# Patient Record
Sex: Female | Born: 1940 | Race: White | Hispanic: No | Smoking: Never smoker
Health system: Southern US, Community
[De-identification: ages and names within clinical notes are randomized; demographics above are authoritative.]

## PROBLEM LIST (undated history)

## (undated) DIAGNOSIS — C44319 Basal cell carcinoma of skin of other parts of face: Secondary | ICD-10-CM

## (undated) DIAGNOSIS — L679 Hair color and hair shaft abnormality, unspecified: Secondary | ICD-10-CM

## (undated) DIAGNOSIS — IMO0001 Reserved for inherently not codable concepts without codable children: Secondary | ICD-10-CM

## (undated) DIAGNOSIS — M791 Myalgia, unspecified site: Secondary | ICD-10-CM

## (undated) DIAGNOSIS — Z9889 Other specified postprocedural states: Secondary | ICD-10-CM

## (undated) DIAGNOSIS — T4145XA Adverse effect of unspecified anesthetic, initial encounter: Secondary | ICD-10-CM

## (undated) DIAGNOSIS — L608 Other nail disorders: Secondary | ICD-10-CM

## (undated) DIAGNOSIS — L570 Actinic keratosis: Secondary | ICD-10-CM

## (undated) DIAGNOSIS — T8859XA Other complications of anesthesia, initial encounter: Secondary | ICD-10-CM

## (undated) DIAGNOSIS — Z86018 Personal history of other benign neoplasm: Secondary | ICD-10-CM

## (undated) DIAGNOSIS — M1990 Unspecified osteoarthritis, unspecified site: Secondary | ICD-10-CM

## (undated) DIAGNOSIS — I639 Cerebral infarction, unspecified: Secondary | ICD-10-CM

## (undated) DIAGNOSIS — I10 Essential (primary) hypertension: Secondary | ICD-10-CM

## (undated) DIAGNOSIS — M5030 Other cervical disc degeneration, unspecified cervical region: Secondary | ICD-10-CM

## (undated) DIAGNOSIS — G459 Transient cerebral ischemic attack, unspecified: Secondary | ICD-10-CM

## (undated) DIAGNOSIS — R112 Nausea with vomiting, unspecified: Secondary | ICD-10-CM

## (undated) HISTORY — DX: Other nail disorders: L60.8

## (undated) HISTORY — DX: Basal cell carcinoma of skin of other parts of face: C44.319

## (undated) HISTORY — DX: Actinic keratosis: L57.0

## (undated) HISTORY — PX: TONSILLECTOMY: SUR1361

## (undated) HISTORY — PX: FOOT SURGERY: SHX648

## (undated) HISTORY — DX: Myalgia, unspecified site: M79.10

## (undated) HISTORY — DX: Personal history of other benign neoplasm: Z86.018

## (undated) HISTORY — PX: FRACTURE SURGERY: SHX138

## (undated) HISTORY — PX: COLONOSCOPY: SHX174

## (undated) HISTORY — PX: ABDOMINAL HYSTERECTOMY: SHX81

## (undated) HISTORY — PX: OTHER SURGICAL HISTORY: SHX169

## (undated) HISTORY — DX: Cerebral infarction, unspecified: I63.9

## (undated) HISTORY — DX: Hair color and hair shaft abnormality, unspecified: L67.9

## (undated) HISTORY — DX: Essential (primary) hypertension: I10

---

## 1998-07-08 ENCOUNTER — Ambulatory Visit (HOSPITAL_COMMUNITY): Admission: RE | Admit: 1998-07-08 | Discharge: 1998-07-08 | Payer: Self-pay | Admitting: Gastroenterology

## 2001-05-17 ENCOUNTER — Other Ambulatory Visit: Admission: RE | Admit: 2001-05-17 | Discharge: 2001-05-17 | Payer: Self-pay | Admitting: Obstetrics and Gynecology

## 2001-10-02 ENCOUNTER — Encounter: Admission: RE | Admit: 2001-10-02 | Discharge: 2001-12-31 | Payer: Self-pay | Admitting: Obstetrics and Gynecology

## 2002-01-07 ENCOUNTER — Encounter: Payer: Self-pay | Admitting: Emergency Medicine

## 2002-01-07 ENCOUNTER — Emergency Department (HOSPITAL_COMMUNITY): Admission: EM | Admit: 2002-01-07 | Discharge: 2002-01-07 | Payer: Self-pay | Admitting: Emergency Medicine

## 2002-05-21 ENCOUNTER — Other Ambulatory Visit: Admission: RE | Admit: 2002-05-21 | Discharge: 2002-05-21 | Payer: Self-pay | Admitting: Obstetrics and Gynecology

## 2002-09-11 ENCOUNTER — Ambulatory Visit (HOSPITAL_COMMUNITY): Admission: RE | Admit: 2002-09-11 | Discharge: 2002-09-11 | Payer: Self-pay | Admitting: Gastroenterology

## 2003-10-23 ENCOUNTER — Inpatient Hospital Stay (HOSPITAL_COMMUNITY): Admission: RE | Admit: 2003-10-23 | Discharge: 2003-10-26 | Payer: Self-pay | Admitting: Neurosurgery

## 2004-05-15 DIAGNOSIS — I639 Cerebral infarction, unspecified: Secondary | ICD-10-CM

## 2004-05-15 HISTORY — DX: Cerebral infarction, unspecified: I63.9

## 2005-12-15 ENCOUNTER — Inpatient Hospital Stay: Payer: Self-pay | Admitting: Internal Medicine

## 2005-12-15 ENCOUNTER — Other Ambulatory Visit: Payer: Self-pay

## 2005-12-27 ENCOUNTER — Ambulatory Visit: Payer: Self-pay | Admitting: Internal Medicine

## 2006-09-11 ENCOUNTER — Ambulatory Visit: Payer: Self-pay | Admitting: Internal Medicine

## 2006-09-20 ENCOUNTER — Ambulatory Visit: Payer: Self-pay | Admitting: Internal Medicine

## 2006-10-04 ENCOUNTER — Ambulatory Visit: Payer: Self-pay | Admitting: Gastroenterology

## 2006-10-04 ENCOUNTER — Emergency Department: Payer: Self-pay | Admitting: Emergency Medicine

## 2008-06-18 ENCOUNTER — Encounter: Admission: RE | Admit: 2008-06-18 | Discharge: 2008-06-18 | Payer: Self-pay | Admitting: Neurosurgery

## 2009-02-09 ENCOUNTER — Ambulatory Visit: Payer: Self-pay | Admitting: Cardiology

## 2009-12-01 ENCOUNTER — Ambulatory Visit: Payer: Self-pay | Admitting: Family Medicine

## 2010-06-04 ENCOUNTER — Encounter: Payer: Self-pay | Admitting: Neurosurgery

## 2010-08-12 ENCOUNTER — Emergency Department: Payer: Self-pay | Admitting: Emergency Medicine
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
## 2010-09-30 NOTE — Op Note (Signed)
NAMECHERRILL, Kristina Banks                          ACCOUNT NO.:  [id number]   MEDICAL RECORD NO.:  [id number]                   PATIENT TYPE:  INP   LOCATION:  2899                                 FACILITY:  [facility]   PHYSICIAN:  Hilda Lias, M.D.                DATE OF BIRTH:  15-May-1941   DATE OF PROCEDURE:  10/23/2003  DATE OF DISCHARGE:                                 OPERATIVE REPORT   PREOPERATIVE DIAGNOSIS:  C4-C5 and C5-C6 spondylosis with chronic  radiculopathy.   POSTOPERATIVE DIAGNOSIS:  C4-C5 and C5-C6 spondylosis with chronic  radiculopathy.   PROCEDURE:  C4-C5 and C5-C6 spinal cord decompression, bilateral  foraminotomy, interbody fusion using bone graft from the right hip, plate  from C4 to C6, microscope.   SURGEON:  Hilda Lias, M.D.   ASSISTANT:  Payton Doughty, M.D.   CLINICAL HISTORY:  The patient was admitted because of neck pain with  radiation to both upper extremities.  X-ray showed spondylosis at the level  of C4-C5 and C5-C6.  The patient was seen before by a surgeon who advised  surgery.  The risks were explained during the history and physical.  The  patient wanted to use her own bone from the hip.  The risks associated with  the hip were explained including possible hematoma, infection, and numbness.   PROCEDURE:  The patient was taken to the OR and after intubation, the neck  was prepped with Betadine as well as the right hip.  A transverse incision  in the skin through the subcutaneous tissue and platysma was carried out.  An x-ray showed that we were at the level of 5-6.  The patient had anterior  osteophyte which were removed.  We brought the microscope into the area and  with the microcuret, we did total gross discectomy.  The patient had quite a  bit of degenerative disc disease.  The posterior ligament was also thick.  An incision was made and decompression of the spinal cord as well as the C5  nerve root was done.  The same procedure was done  essentially at the level  of 5-6.  Then, we drilled the endplates.  From then on, we went to the hip  where an incision was made through the skin and subcutaneous tissue until we  found the iliac crest.  Using the osteotome, we removed two pieces of bone.  The bone was quite osteoporotic and the donor site was bleeding quite a bit.  The bone was cleaned and they were introduced at the level of 4-5 and 5-6.  This was followed by a plate using five screws.  Then, we went back to the  hip.  Hemostasis was done.  Then, we had to use Avitene to allow for  hemostasis.  Once we were happy with the hemostasis, the wounds were closed  with Vicryl and Steri-Strips.  Hilda Lias, M.D.    EB/MEDQ  D:  10/23/2003  T:  10/23/2003  Job:  [id number]

## 2011-12-04 ENCOUNTER — Ambulatory Visit: Payer: Self-pay | Admitting: Gastroenterology

## 2012-07-31 ENCOUNTER — Emergency Department: Payer: Self-pay | Admitting: Emergency Medicine

## 2013-02-13 ENCOUNTER — Other Ambulatory Visit: Payer: Self-pay | Admitting: Podiatry

## 2013-02-13 MED ORDER — PROMETHAZINE HCL 25 MG PO TABS: 25.0000 mg | ORAL_TABLET | Freq: Four times a day (QID) | ORAL | Status: DC | PRN

## 2013-02-13 MED ORDER — OXYCODONE-ACETAMINOPHEN 10-650 MG PO TABS: 1.0000 | ORAL_TABLET | Freq: Four times a day (QID) | ORAL | Status: DC | PRN

## 2013-02-13 MED ORDER — CEPHALEXIN 500 MG PO CAPS: 500.0000 mg | ORAL_CAPSULE | Freq: Three times a day (TID) | ORAL | Status: DC

## 2013-02-14 ENCOUNTER — Encounter: Payer: Self-pay | Admitting: Podiatry

## 2013-02-14 ENCOUNTER — Other Ambulatory Visit: Payer: Self-pay | Admitting: *Deleted

## 2013-02-14 ENCOUNTER — Telehealth: Payer: Self-pay | Admitting: *Deleted

## 2013-02-14 DIAGNOSIS — L851 Acquired keratosis [keratoderma] palmaris et plantaris: Secondary | ICD-10-CM

## 2013-02-14 DIAGNOSIS — M898X9 Other specified disorders of bone, unspecified site: Secondary | ICD-10-CM

## 2013-02-14 DIAGNOSIS — L600 Ingrowing nail: Secondary | ICD-10-CM

## 2013-02-14 MED ORDER — OXYCODONE-ACETAMINOPHEN 10-325 MG PO TABS: 1.0000 | ORAL_TABLET | Freq: Four times a day (QID) | ORAL | Status: DC | PRN

## 2013-02-14 MED ORDER — MEPERIDINE HCL 50 MG PO TABS: 50.0000 mg | ORAL_TABLET | ORAL | Status: DC | PRN

## 2013-02-14 NOTE — Telephone Encounter (Signed)
Pt's husband Maisie Fus states pt's Percocet was written wrong.  Dr Irving Shows rewrote Percocet to 10/325mg  with the same sig and quantity.  Pt's husband knows to pick-up corrected prescription at the [facility].

## 2013-02-18 ENCOUNTER — Ambulatory Visit (INDEPENDENT_AMBULATORY_CARE_PROVIDER_SITE_OTHER): Payer: Medicare Other | Admitting: Podiatry

## 2013-02-18 ENCOUNTER — Encounter: Payer: Self-pay | Admitting: Podiatry

## 2013-02-18 ENCOUNTER — Ambulatory Visit (INDEPENDENT_AMBULATORY_CARE_PROVIDER_SITE_OTHER): Payer: Medicare Other

## 2013-02-18 VITALS — BP 129/72 | HR 67 | Temp 99.5°F | Resp 12 | Ht 65.0 in | Wt 142.0 lb

## 2013-02-18 DIAGNOSIS — G8918 Other acute postprocedural pain: Secondary | ICD-10-CM

## 2013-02-18 DIAGNOSIS — S9030XA Contusion of unspecified foot, initial encounter: Secondary | ICD-10-CM | POA: Insufficient documentation

## 2013-02-18 DIAGNOSIS — S9031XD Contusion of right foot, subsequent encounter: Secondary | ICD-10-CM

## 2013-02-18 DIAGNOSIS — Z5189 Encounter for other specified aftercare: Secondary | ICD-10-CM

## 2013-02-18 DIAGNOSIS — M898X7 Other specified disorders of bone, ankle and foot: Secondary | ICD-10-CM

## 2013-02-18 DIAGNOSIS — M898X9 Other specified disorders of bone, unspecified site: Secondary | ICD-10-CM

## 2013-02-18 NOTE — Progress Notes (Signed)
Kristina Banks presents today status post exostectomy hallux right, nail excision hallux right, excision soft tissue tumor hallux right, last Friday. She states that the toe feels pretty good but the outside of her foot is killing me she refers to the fifth metatarsal of the right foot. Denies any trauma states that she's been in her postop shoe the entire time.  Objective: Pulses are strongly palpable to the right foot dry sterile dressing was intact. Once removed demonstrates erythema and edema to the fifth metatarsal base of the right foot. There is tenderness on palpation of this area. Radiographic evaluation does not demonstrate any type of osseous abnormalities to the fifth metatarsal base of the right foot.  Assessment: Well-healing surgical hallux right. Contusion and pain to the fifth metatarsal base right probably associated with the dressing.  Plan: Redressed with an dry sterile compressive dressing today according the fifth metatarsal base. She will continue to followup with me Thursday of this week. Sutures will be removed one week from this Thursday.

## 2013-02-20 ENCOUNTER — Encounter: Payer: Self-pay | Admitting: Podiatry

## 2013-02-26 ENCOUNTER — Ambulatory Visit (INDEPENDENT_AMBULATORY_CARE_PROVIDER_SITE_OTHER): Payer: Medicare Other | Admitting: Podiatry

## 2013-02-26 ENCOUNTER — Encounter: Payer: Self-pay | Admitting: Podiatry

## 2013-02-26 VITALS — BP 138/72 | HR 64 | Temp 98.4°F | Resp 16 | Ht 65.0 in | Wt 142.0 lb

## 2013-02-26 DIAGNOSIS — M898X9 Other specified disorders of bone, unspecified site: Secondary | ICD-10-CM | POA: Insufficient documentation

## 2013-02-26 NOTE — Progress Notes (Signed)
Kristina Banks presents today 12 days status post excision soft tissue mass hallux right and excision of nail border hallux right. She states she is ready to wash the toe.  Objective: Dry sterile dressing was intact. Once removed reveals sutures are in place margins to the matrixectomy appear to be healed however the more medial incision does not appear to be healed yet. There is no signs of infection.  Assessment: Well-healing surgical toe hallux right  Plan: Suture removal of the matrixectomy site. We will remove the remainder of the sutures in one week. She is to start soaking this toe in Epsom salts in warm water. I will followup with her for suture removal next week.

## 2013-02-27 ENCOUNTER — Encounter: Payer: Medicare Other | Admitting: Podiatry

## 2013-03-04 ENCOUNTER — Encounter: Payer: Medicare Other | Admitting: Podiatry

## 2013-03-05 ENCOUNTER — Ambulatory Visit (INDEPENDENT_AMBULATORY_CARE_PROVIDER_SITE_OTHER): Payer: Medicare Other | Admitting: Podiatry

## 2013-03-05 ENCOUNTER — Ambulatory Visit (INDEPENDENT_AMBULATORY_CARE_PROVIDER_SITE_OTHER): Payer: Medicare Other

## 2013-03-05 ENCOUNTER — Encounter: Payer: Self-pay | Admitting: Podiatry

## 2013-03-05 VITALS — BP 130/62 | HR 70 | Resp 16 | Ht 65.0 in | Wt 142.0 lb

## 2013-03-05 DIAGNOSIS — M898X9 Other specified disorders of bone, unspecified site: Secondary | ICD-10-CM

## 2013-03-05 DIAGNOSIS — M779 Enthesopathy, unspecified: Secondary | ICD-10-CM

## 2013-03-05 NOTE — Progress Notes (Signed)
Kristina Banks presents today for postop visit regarding her right foot date of surgery was 10 3. Sutures are removed margins appear to be well coapted a little bit of pain she says.  Objective: Vital signs are stable she is alert and oriented x3 pulses remain palpable right lower extremity. Surgical site right hallux demonstrates no erythema edema saline is drainage or odor margins are well coapted no dehiscence. Radiographs do demonstrate complete resection of exostosis hallux right.  Assessment: Well-healing surgical foot right.  Plan: We discussed etiology pathology conservative versus surgical therapies. She will continue to wrap the toe with Caban. Followup with me in one month for final set of x-rays.

## 2013-03-06 ENCOUNTER — Encounter: Payer: Self-pay | Admitting: Podiatry

## 2013-03-13 DIAGNOSIS — Z86018 Personal history of other benign neoplasm: Secondary | ICD-10-CM

## 2013-03-13 HISTORY — DX: Personal history of other benign neoplasm: Z86.018

## 2013-04-07 ENCOUNTER — Encounter: Payer: Self-pay | Admitting: Podiatry

## 2013-04-07 ENCOUNTER — Ambulatory Visit (INDEPENDENT_AMBULATORY_CARE_PROVIDER_SITE_OTHER): Payer: Medicare Other | Admitting: Podiatry

## 2013-04-07 ENCOUNTER — Ambulatory Visit (INDEPENDENT_AMBULATORY_CARE_PROVIDER_SITE_OTHER): Payer: Medicare Other

## 2013-04-07 VITALS — BP 134/76 | HR 71 | Resp 16 | Ht 65.0 in | Wt 142.0 lb

## 2013-04-07 DIAGNOSIS — Z9889 Other specified postprocedural states: Secondary | ICD-10-CM

## 2013-04-07 NOTE — Progress Notes (Signed)
Lular presents today for followup of an exostectomy tibial border hallux right at the IPJ. She states it really hasn't gotten any better yet. She still has some surgical soreness.  Objective: Patient is daughter and recently died at the age of 49. She still quite distraught. The hallux right demonstrate some erythema and the scar but nothing abnormal. Radiographic evaluation demonstrates very nice medial condyle resection.  Assessment: Well-healing surgical hallux right  Plan: I encouraged her to massage the scar site and followup with me on an as-needed basis.

## 2013-04-20 ENCOUNTER — Emergency Department: Payer: Self-pay | Admitting: Internal Medicine

## 2013-05-26 NOTE — Progress Notes (Signed)
1. SURGICAL EXCISION TIBIAL BORDER HALLUX NAIL RT  2. EXOSTECTOMY MEDIAL DISTAL PHALANGE RT  3. EXCISION SOFT TISSUE TUMOR RT HALLUX

## 2013-06-17 ENCOUNTER — Other Ambulatory Visit: Payer: Self-pay | Admitting: Gastroenterology

## 2013-06-17 LAB — CLOSTRIDIUM DIFFICILE(ARMC)

## 2013-11-24 ENCOUNTER — Ambulatory Visit (INDEPENDENT_AMBULATORY_CARE_PROVIDER_SITE_OTHER): Payer: Medicare Other | Admitting: Podiatry

## 2013-11-24 ENCOUNTER — Encounter: Payer: Self-pay | Admitting: Podiatry

## 2013-11-24 ENCOUNTER — Ambulatory Visit (INDEPENDENT_AMBULATORY_CARE_PROVIDER_SITE_OTHER): Payer: Medicare Other

## 2013-11-24 DIAGNOSIS — M898X9 Other specified disorders of bone, unspecified site: Secondary | ICD-10-CM

## 2013-11-24 DIAGNOSIS — L600 Ingrowing nail: Secondary | ICD-10-CM

## 2013-11-24 DIAGNOSIS — M7750 Other enthesopathy of unspecified foot: Secondary | ICD-10-CM

## 2013-11-24 DIAGNOSIS — M778 Other enthesopathies, not elsewhere classified: Secondary | ICD-10-CM

## 2013-11-24 DIAGNOSIS — M779 Enthesopathy, unspecified: Secondary | ICD-10-CM

## 2013-11-24 MED ORDER — NEOMYCIN-POLYMYXIN-HC 3.5-10000-1 OT SOLN: OTIC | Status: DC

## 2013-11-24 NOTE — Progress Notes (Signed)
She presents today chief complaint of pain to the IP joint of the hallux right with swelling and tenderness. The surgical site or we performed a exostectomy a year ago. She states that she also has pain along the tibial border of the hallux left.  Objective: Vital signs are stable she is alert and oriented x3. Pain on palpation medial aspect of the IP joint hallux right. Radiographic evaluation demonstrates well-healing condylectomy/exostectomy medial distal phalanx right foot. She has erythema and edema with a sharp incurvated nail margin along the tibial border of the hallux left.  Assessment: Scar tissue and capsulitis IP joint of the hallux right. Ingrown nail paronychia abscess tibial border hallux left.  Plan: Injected dexamethasone along the medial aspect of the hallux right. Performed a chemical matrixectomy to the tibial border of the hallux left. She tolerated this procedure well with local anesthetic she was given both oral and written home-going instructions for the care of this toe as well as for a prescription for Cortisporin Otic and I will followup with her in one week. We also discussed appropriate shoe gear to prevent irritation of the hallux right.

## 2013-11-24 NOTE — Patient Instructions (Signed)

## 2013-12-01 ENCOUNTER — Encounter: Payer: Self-pay | Admitting: Podiatry

## 2013-12-01 ENCOUNTER — Ambulatory Visit (INDEPENDENT_AMBULATORY_CARE_PROVIDER_SITE_OTHER): Payer: Medicare Other | Admitting: Podiatry

## 2013-12-01 DIAGNOSIS — L600 Ingrowing nail: Secondary | ICD-10-CM

## 2013-12-01 NOTE — Progress Notes (Signed)
She presents today for followup of her matrixectomy tibial border hallux left. She states it is doing great. She denies fever chills nausea vomiting muscle aches and pains he continues to soak regularly.  Objective: Pulses are palpable left foot. Margin appears to be well healing along the tibial border of the hallux left. There is no erythema edema cellulitis drainage or odor.  Assessment: Well-healing surgical toe hallux left.  Plan: Followup with me on an as-needed basis. Continue to soak in Epsom salts and water. Apply Cortisporin otic twice daily until completely healed.

## 2014-06-18 ENCOUNTER — Ambulatory Visit: Payer: Self-pay | Admitting: Specialist

## 2014-07-02 ENCOUNTER — Ambulatory Visit: Payer: Self-pay | Admitting: Specialist

## 2014-07-02 DIAGNOSIS — I10 Essential (primary) hypertension: Secondary | ICD-10-CM

## 2014-07-08 ENCOUNTER — Ambulatory Visit: Payer: Self-pay | Admitting: Specialist

## 2014-09-13 NOTE — Op Note (Signed)
PATIENT NAME:  Kristina Banks, Kristina Banks MR#:  [id number] DATE OF BIRTH:  June 17, 1940  DATE OF PROCEDURE:  07/08/2014  PREOPERATIVE DIAGNOSES:  1.  Anterior partial articular supraspinatus tendon avulsion lesion, left supraspinatus tendon  2.  Advanced osteoarthritis, left acromioclavicular joint.  3.  Severe fraying of the biceps tendon.  4.  Severe bursitis and impingement of subacromial space.   POSTOPERATIVE DIAGNOSES: 1.  Anterior partial articular supraspinatus tendon avulsion lesion, left supraspinatus tendon  2.  Advanced osteoarthritis, left acromioclavicular joint.  3.  Severe fraying of the biceps tendon.  4.  Severe bursitis and impingement of subacromial space.  OPERATIVE PROCEDURES:  1.  Arthroscopic left rotator cuff repair.  2.  Arthroscopic left distal clavicle excision.  3.  Arthroscopic subacromial decompression and bursectomy. 4.  Arthroscopic biceps tenotomy.   SURGEON: Park Breed, MD   ANESTHESIA: General endotracheal plus interscalene block.   COMPLICATIONS: None.   ESTIMATED BLOOD LOSS: Minimal.   REPLACEMENT: None.   DESCRIPTION OF PROCEDURE: The patient was brought to the operating room where she underwent satisfactory general endotracheal anesthesia after a left interscalene block was put in place. She was turned into a right lateral decubitus position and padded appropriately on the beanbag. The left shoulder was prepped and draped in sterile fashion and placed in 10 pounds of traction. Arthroscopy was carried out from a posterior portal with accessory portals being made laterally and anteriorly during the procedure. The above findings were encountered upon entering the joint. There was fraying of the labrum and the biceps was severely frayed. The ArthroCare wand was used to release this at its origin. The motorized resector was used to remove frayed labral tissue. The glenohumeral surfaces were normal. Superiorly and anteriorly, the biceps tendon was seen to be  detached anteriorly with a few fibers remaining superiorly. The spinal needle was introduced and a PDS suture passed through this area to mark it for later. The arthroscope was then redirected into the subacromial space where there was extensive bursitis. This was removed with a motorized resector and ArthroCare wand. The soft tissue was removed from the undersurface of the acromion and distal clavicle. Bur was used to remove the prominence of the acromion and a portion of the distal clavicle. The bur was introduced from the anterior portal and the remainder of the distal clavicle was removed. The diseased area of the cuff was identified by the PDS suture, which was removed. Using probe showed that the remaining fibers were quite thin and the probe enter the joint easily. A motorized resector and ArthroCare wand were used to debride the remaining diseased tissue. The tear was quite anterior right behind the subscapularis tendon. The bony surface of the tuberosity was debrided and a bur used to freshen the bone. The majority of the tear had retracted more posteriorly and by pulling it anteriorly we could cover the tuberosity well. Three individual Magnum wire sutures were placed at the apex of the tear. Another suture was then placed in the main body of the tendon and a 6.5 mm SpeedScrew introduced into the lateral aspect of the tuberosity. These sutures were then placed in the SpeedScrew device and tightened down snugly. Five pounds of traction were removed and the cuff came over and covered the tuberosity nicely. After final tightening, the apparatus was removed and the sutures cut. Final visualization showed excellent decompression and good coverage of the tuberosity with the repair. After thorough irrigation, the stab wounds were closed with 3-0 nylon suture; 0.25  Marcaine with epinephrine and morphine was placed in the bursa. Dry sterile dressings and TENS pads were applied. A padded sling was applied. The  patient was awakened and taken to recovery in good condition.    ____________________________ Park Breed, MD hem:bm D: 07/08/2014 11:32:02 ET T: 07/09/2014 02:27:57 ET JOB#: [id number]  cc: Park Breed, MD, <Dictator> Park Breed MD ELECTRONICALLY SIGNED 07/13/2014 15:30

## 2014-10-15 ENCOUNTER — Other Ambulatory Visit: Payer: Self-pay | Admitting: Specialist

## 2014-10-15 DIAGNOSIS — M75122 Complete rotator cuff tear or rupture of left shoulder, not specified as traumatic: Secondary | ICD-10-CM

## 2014-10-21 ENCOUNTER — Ambulatory Visit
Admission: RE | Admit: 2014-10-21 | Discharge: 2014-10-21 | Disposition: A | Discharge status: Home / Self Care | Payer: Medicare Other | Source: Ambulatory Visit | Attending: Specialist | Admitting: Specialist

## 2014-10-21 DIAGNOSIS — M75122 Complete rotator cuff tear or rupture of left shoulder, not specified as traumatic: Secondary | ICD-10-CM | POA: Diagnosis present

## 2014-10-21 DIAGNOSIS — S46812A Strain of other muscles, fascia and tendons at shoulder and upper arm level, left arm, initial encounter: Secondary | ICD-10-CM | POA: Diagnosis not present

## 2014-10-21 DIAGNOSIS — Z9889 Other specified postprocedural states: Secondary | ICD-10-CM | POA: Insufficient documentation

## 2014-10-26 ENCOUNTER — Other Ambulatory Visit: Payer: Self-pay | Admitting: Family Medicine

## 2014-10-26 DIAGNOSIS — G44321 Chronic post-traumatic headache, intractable: Secondary | ICD-10-CM

## 2014-11-02 ENCOUNTER — Ambulatory Visit
Admission: RE | Admit: 2014-11-02 | Discharge: 2014-11-02 | Disposition: A | Discharge status: Home / Self Care | Payer: Medicare Other | Source: Ambulatory Visit | Attending: Family Medicine | Admitting: Family Medicine

## 2014-11-02 DIAGNOSIS — G44329 Chronic post-traumatic headache, not intractable: Secondary | ICD-10-CM | POA: Diagnosis present

## 2014-11-02 DIAGNOSIS — W19XXXA Unspecified fall, initial encounter: Secondary | ICD-10-CM | POA: Insufficient documentation

## 2014-11-02 DIAGNOSIS — G44321 Chronic post-traumatic headache, intractable: Secondary | ICD-10-CM

## 2014-12-08 ENCOUNTER — Encounter
Admission: RE | Admit: 2014-12-08 | Discharge: 2014-12-08 | Disposition: A | Discharge status: Home / Self Care | Payer: Medicare Other | Source: Ambulatory Visit | Attending: Specialist | Admitting: Specialist

## 2014-12-08 DIAGNOSIS — Z01812 Encounter for preprocedural laboratory examination: Secondary | ICD-10-CM | POA: Insufficient documentation

## 2014-12-08 DIAGNOSIS — Z01810 Encounter for preprocedural cardiovascular examination: Secondary | ICD-10-CM | POA: Insufficient documentation

## 2014-12-08 HISTORY — DX: Unspecified osteoarthritis, unspecified site: M19.90

## 2014-12-08 HISTORY — DX: Reserved for inherently not codable concepts without codable children: IMO0001

## 2014-12-08 LAB — POTASSIUM: POTASSIUM: 4 mmol/L (ref 3.5–5.1)

## 2014-12-08 NOTE — Patient Instructions (Signed)
  Your procedure is scheduled on: 12/16/14 Wed Report to Day Surgery. To find out your arrival time please call [phone] between 1PM - 3PM on 12/15/14 Tues.  Remember: Instructions that are not followed completely may result in serious medical risk, up to and including death, or upon the discretion of your surgeon and anesthesiologist your surgery may need to be rescheduled.    _x___ 1. Do not eat food or drink liquids after midnight. No gum chewing or hard candies.     ____ 2. No Alcohol for 24 hours before or after surgery.   ____ 3. Bring all medications with you on the day of surgery if instructed.    _x___ 4. Notify your doctor if there is any change in your medical condition     (cold, fever, infections).     Do not wear jewelry, make-up, hairpins, clips or nail polish.  Do not wear lotions, powders, or perfumes. You may wear deodorant.  Do not shave 48 hours prior to surgery. Men may shave face and neck.  Do not bring valuables to the hospital.    [facility] is not responsible for any belongings or valuables.               Contacts, dentures or bridgework may not be worn into surgery.  Leave your suitcase in the car. After surgery it may be brought to your room.  For patients admitted to the hospital, discharge time is determined by your                treatment team.   Patients discharged the day of surgery will not be allowed to drive home.   Please read over the following fact sheets that you were given:      ____ Take these medicines the morning of surgery with A SIP OF WATER:    1.   2.   3.   4.  5.  6.  ____ Fleet Enema (as directed)   _x___ Use CHG Soap as directed  ____ Use inhalers on the day of surgery  ____ Stop metformin 2 days prior to surgery    ____ Take 1/2 of usual insulin dose the night before surgery and none on the morning of surgery.   _x___ Stop Coumadin/Plavix/aspirin on Stop Aspirin on 12/09/14  _x___ Stop Anti-inflammatories on  Stop Meloxicam on 12/09/14   __x__ Stop supplements until after surgery.  Stop fish oils on 12/09/14  ____ Bring C-Pap to the hospital.

## 2014-12-16 ENCOUNTER — Ambulatory Visit: Payer: Medicare Other | Admitting: Anesthesiology

## 2014-12-16 ENCOUNTER — Encounter: Payer: Self-pay | Admitting: *Deleted

## 2014-12-16 ENCOUNTER — Ambulatory Visit
Admission: RE | Admit: 2014-12-16 | Discharge: 2014-12-16 | Disposition: A | Discharge status: Home / Self Care | Payer: Medicare Other | Source: Ambulatory Visit | Attending: Specialist | Admitting: Specialist

## 2014-12-16 ENCOUNTER — Encounter
Admission: RE | Disposition: A | Discharge status: Home / Self Care | Payer: Self-pay | Source: Ambulatory Visit | Attending: Specialist

## 2014-12-16 DIAGNOSIS — M75122 Complete rotator cuff tear or rupture of left shoulder, not specified as traumatic: Secondary | ICD-10-CM | POA: Diagnosis not present

## 2014-12-16 DIAGNOSIS — I10 Essential (primary) hypertension: Secondary | ICD-10-CM | POA: Insufficient documentation

## 2014-12-16 DIAGNOSIS — M1990 Unspecified osteoarthritis, unspecified site: Secondary | ICD-10-CM | POA: Insufficient documentation

## 2014-12-16 DIAGNOSIS — Z8673 Personal history of transient ischemic attack (TIA), and cerebral infarction without residual deficits: Secondary | ICD-10-CM | POA: Insufficient documentation

## 2014-12-16 DIAGNOSIS — W19XXXA Unspecified fall, initial encounter: Secondary | ICD-10-CM | POA: Diagnosis not present

## 2014-12-16 DIAGNOSIS — S43422A Sprain of left rotator cuff capsule, initial encounter: Secondary | ICD-10-CM

## 2014-12-16 HISTORY — PX: ARTHOSCOPIC ROTAOR CUFF REPAIR: SHX5002

## 2014-12-16 SURGERY — REPAIR, ROTATOR CUFF, ARTHROSCOPIC
Anesthesia: General | Site: Shoulder | Laterality: Left | Wound class: Clean

## 2014-12-16 MED ORDER — BUPIVACAINE-EPINEPHRINE 0.5% -1:200000 IJ SOLN
INTRAMUSCULAR | Status: DC | PRN
  Administered 2014-12-16: 30 mL

## 2014-12-16 MED ORDER — EPINEPHRINE HCL 1 MG/ML IJ SOLN
INTRAMUSCULAR | Status: AC
  Filled 2014-12-16: qty 1

## 2014-12-16 MED ORDER — MORPHINE SULFATE 4 MG/ML IJ SOLN
INTRAMUSCULAR | Status: AC
  Filled 2014-12-16: qty 1

## 2014-12-16 MED ORDER — GABAPENTIN 400 MG PO CAPS: 400.0000 mg | ORAL_CAPSULE | Freq: Three times a day (TID) | ORAL | Status: DC

## 2014-12-16 MED ORDER — FENTANYL CITRATE (PF) 100 MCG/2ML IJ SOLN
INTRAMUSCULAR | Status: AC
  Administered 2014-12-16: 25 ug via INTRAVENOUS
  Filled 2014-12-16: qty 2

## 2014-12-16 MED ORDER — FENTANYL CITRATE (PF) 100 MCG/2ML IJ SOLN
INTRAMUSCULAR | Status: DC | PRN
  Administered 2014-12-16: 50 ug via INTRAVENOUS
  Administered 2014-12-16 (×2): 100 ug via INTRAVENOUS

## 2014-12-16 MED ORDER — EPINEPHRINE HCL 1 MG/ML IJ SOLN
INTRAMUSCULAR | Status: DC | PRN
Start: 2014-12-16 — End: 2014-12-16
  Administered 2014-12-16: 1 mg

## 2014-12-16 MED ORDER — PROPOFOL 10 MG/ML IV BOLUS
INTRAVENOUS | Status: DC | PRN
  Administered 2014-12-16: 150 mg via INTRAVENOUS

## 2014-12-16 MED ORDER — LIDOCAINE HCL (PF) 1 % IJ SOLN
INTRAMUSCULAR | Status: AC
  Filled 2014-12-16: qty 25

## 2014-12-16 MED ORDER — FAMOTIDINE 20 MG PO TABS
20.0000 mg | ORAL_TABLET | Freq: Once | ORAL | Status: AC
  Administered 2014-12-16: 20 mg via ORAL

## 2014-12-16 MED ORDER — BUPIVACAINE-EPINEPHRINE (PF) 0.25% -1:200000 IJ SOLN
INTRAMUSCULAR | Status: AC
  Filled 2014-12-16: qty 30

## 2014-12-16 MED ORDER — DEXAMETHASONE SODIUM PHOSPHATE 4 MG/ML IJ SOLN
INTRAMUSCULAR | Status: DC | PRN
  Administered 2014-12-16: 4 mg via INTRAVENOUS

## 2014-12-16 MED ORDER — ONDANSETRON HCL 4 MG/2ML IJ SOLN
INTRAMUSCULAR | Status: DC | PRN
  Administered 2014-12-16: 4 mg via INTRAVENOUS

## 2014-12-16 MED ORDER — NEOSTIGMINE METHYLSULFATE 10 MG/10ML IV SOLN
INTRAVENOUS | Status: DC | PRN
  Administered 2014-12-16: 3 mg via INTRAVENOUS

## 2014-12-16 MED ORDER — GLYCOPYRROLATE 0.2 MG/ML IJ SOLN
INTRAMUSCULAR | Status: DC | PRN
  Administered 2014-12-16: .4 mg via INTRAVENOUS

## 2014-12-16 MED ORDER — MELOXICAM 7.5 MG PO TABS
15.0000 mg | ORAL_TABLET | Freq: Once | ORAL | Status: AC
  Administered 2014-12-16: 15 mg via ORAL

## 2014-12-16 MED ORDER — CEFAZOLIN SODIUM-DEXTROSE 2-3 GM-% IV SOLR
INTRAVENOUS | Status: AC
  Filled 2014-12-16: qty 50

## 2014-12-16 MED ORDER — BUPIVACAINE-EPINEPHRINE (PF) 0.25% -1:200000 IJ SOLN
INTRAMUSCULAR | Status: DC | PRN
Start: 2014-12-16 — End: 2014-12-16
  Administered 2014-12-16: 30 mL via PERINEURAL

## 2014-12-16 MED ORDER — LIDOCAINE HCL (CARDIAC) 20 MG/ML IV SOLN
INTRAVENOUS | Status: DC | PRN
  Administered 2014-12-16: 50 mg via INTRAVENOUS

## 2014-12-16 MED ORDER — LACTATED RINGERS IV SOLN
INTRAVENOUS | Status: DC
  Administered 2014-12-16 (×3): via INTRAVENOUS

## 2014-12-16 MED ORDER — TRAMADOL HCL 50 MG PO TABS: 50.0000 mg | ORAL_TABLET | Freq: Four times a day (QID) | ORAL | Status: DC | PRN

## 2014-12-16 MED ORDER — HYDROCODONE-ACETAMINOPHEN 7.5-325 MG PO TABS: 1.0000 | ORAL_TABLET | Freq: Four times a day (QID) | ORAL | Status: DC | PRN

## 2014-12-16 MED ORDER — FENTANYL CITRATE (PF) 100 MCG/2ML IJ SOLN
25.0000 ug | INTRAMUSCULAR | Status: DC | PRN
  Administered 2014-12-16: 25 ug via INTRAVENOUS

## 2014-12-16 MED ORDER — ROCURONIUM BROMIDE 100 MG/10ML IV SOLN
INTRAVENOUS | Status: DC | PRN
  Administered 2014-12-16: 30 mg via INTRAVENOUS

## 2014-12-16 MED ORDER — BUPIVACAINE-EPINEPHRINE (PF) 0.5% -1:200000 IJ SOLN
INTRAMUSCULAR | Status: AC
  Filled 2014-12-16: qty 30

## 2014-12-16 MED ORDER — NEOMYCIN-POLYMYXIN B GU 40-200000 IR SOLN
Status: AC
  Filled 2014-12-16: qty 2

## 2014-12-16 MED ORDER — GABAPENTIN 400 MG PO CAPS
ORAL_CAPSULE | ORAL | Status: AC
  Administered 2014-12-16: 400 mg via ORAL
  Filled 2014-12-16: qty 1

## 2014-12-16 MED ORDER — HYDROCODONE-ACETAMINOPHEN 7.5-325 MG PO TABS: 1.0000 | ORAL_TABLET | Freq: Four times a day (QID) | ORAL | Status: DC

## 2014-12-16 MED ORDER — MENTHOL 3 MG MT LOZG: 1.0000 | LOZENGE | OROMUCOSAL | Status: DC | PRN

## 2014-12-16 MED ORDER — ACETAMINOPHEN 325 MG PO TABS: 650.0000 mg | ORAL_TABLET | Freq: Four times a day (QID) | ORAL | Status: DC | PRN

## 2014-12-16 MED ORDER — MIDAZOLAM HCL 2 MG/2ML IJ SOLN
INTRAMUSCULAR | Status: DC | PRN
  Administered 2014-12-16: 2 mg via INTRAVENOUS

## 2014-12-16 MED ORDER — MELOXICAM 7.5 MG PO TABS
ORAL_TABLET | ORAL | Status: AC
  Administered 2014-12-16: 15 mg via ORAL
  Filled 2014-12-16: qty 2

## 2014-12-16 MED ORDER — EPHEDRINE SULFATE 50 MG/ML IJ SOLN
INTRAMUSCULAR | Status: DC | PRN
  Administered 2014-12-16 (×3): 10 mg via INTRAVENOUS

## 2014-12-16 MED ORDER — FAMOTIDINE 20 MG PO TABS
ORAL_TABLET | ORAL | Status: AC
  Administered 2014-12-16: 20 mg via ORAL
  Filled 2014-12-16: qty 1

## 2014-12-16 MED ORDER — SODIUM CHLORIDE 0.45 % IV SOLN
INTRAVENOUS | Status: DC
  Administered 2014-12-16: 10:00:00 via INTRAVENOUS

## 2014-12-16 MED ORDER — ROPIVACAINE HCL 5 MG/ML IJ SOLN
INTRAMUSCULAR | Status: AC
  Filled 2014-12-16: qty 20

## 2014-12-16 MED ORDER — GABAPENTIN 400 MG PO CAPS
400.0000 mg | ORAL_CAPSULE | Freq: Once | ORAL | Status: AC
  Administered 2014-12-16: 400 mg via ORAL

## 2014-12-16 MED ORDER — CEFAZOLIN SODIUM-DEXTROSE 2-3 GM-% IV SOLR
2.0000 g | Freq: Once | INTRAVENOUS | Status: AC
  Administered 2014-12-16: 2 g via INTRAVENOUS

## 2014-12-16 MED ORDER — PROMETHAZINE HCL 25 MG/ML IJ SOLN: 6.2500 mg | INTRAMUSCULAR | Status: DC | PRN

## 2014-12-16 MED ORDER — ALUM & MAG HYDROXIDE-SIMETH 200-200-20 MG/5ML PO SUSP: 30.0000 mL | ORAL | Status: DC | PRN

## 2014-12-16 SURGICAL SUPPLY — 61 items
ADAPTER IRRIG TUBE 2 SPIKE SOL (ADAPTER) ×8 IMPLANT
BLADE AGGRESSIVE PLUS 4.0 (BLADE) ×4 IMPLANT
BLADE SURG SZ11 CARB STEEL (BLADE) ×4 IMPLANT
BUR AGGRESSIVE+ 5.5 (BURR) IMPLANT
BUR BR 5.5 12 FLUTE (BURR) IMPLANT
BUR RADIUS 4.0X18.5 (BURR) IMPLANT
BUR RADIUS 5.5 (BURR) IMPLANT
CANNULA 5.75X7 CRYSTAL CLEAR (CANNULA) IMPLANT
CANNULA 8.5X75 THRED (CANNULA) ×4 IMPLANT
CANNULA PARTIAL THREAD 2X7 (CANNULA) IMPLANT
CHLORAPREP W/TINT 26ML (MISCELLANEOUS) ×4 IMPLANT
CONNECTOR PERFECT PASSER (CONNECTOR) IMPLANT
COVER MAYO STAND STRL (DRAPES) ×4 IMPLANT
DRAPE IMP U-DRAPE 54X76 (DRAPES) ×4 IMPLANT
DRAPE SHEET LG 3/4 BI-LAMINATE (DRAPES) ×4 IMPLANT
DRAPE STERI 35X30 U-POUCH (DRAPES) ×4 IMPLANT
GAUZE PETRO XEROFOAM 1X8 (MISCELLANEOUS) ×4 IMPLANT
GAUZE SPONGE 4X4 12PLY STRL (GAUZE/BANDAGES/DRESSINGS) ×4 IMPLANT
GLOVE SURG ORTHO 8.0 STRL STRW (GLOVE) ×4 IMPLANT
GOWN STRL REUS W/ TWL LRG LVL4 (GOWN DISPOSABLE) ×2 IMPLANT
GOWN STRL REUS W/TWL LRG LVL4 (GOWN DISPOSABLE) ×2
IV LACTATED RINGER IRRG 3000ML (IV SOLUTION) ×16
IV LR IRRIG 3000ML ARTHROMATIC (IV SOLUTION) ×16 IMPLANT
KIT RM TURNOVER STRD PROC AR (KITS) ×4 IMPLANT
KIT SHOULDER TRACTION (DRAPES) ×4 IMPLANT
MANIFOLD NEPTUNE II (INSTRUMENTS) ×4 IMPLANT
MAT BLUE FLOOR 46X72 FLO (MISCELLANEOUS) ×4 IMPLANT
MULTI S KNOTLES FIXATION 5.5 (Orthopedic Implant) ×4 IMPLANT
NDL MAYO CATGUT SZ5 (NEEDLE) ×2
NDL SAFETY 18GX1.5 (NEEDLE) ×4 IMPLANT
NDL SUT 5 .5 CRC TPR PNT MAYO (NEEDLE) ×2 IMPLANT
NEEDLE SPNL 18GX3.5 QUINCKE PK (NEEDLE) ×4 IMPLANT
NS IRRIG 500ML POUR BTL (IV SOLUTION) IMPLANT
PACK ARTHROSCOPY SHOULDER (MISCELLANEOUS) ×4 IMPLANT
PASSER SUT CAPTURE FIRST (SUTURE) ×4 IMPLANT
SET TUBE SUCT SHAVER OUTFL 24K (TUBING) ×4 IMPLANT
SET TUBE TIP INTRA-ARTICULAR (MISCELLANEOUS) ×4 IMPLANT
SLING ULTRA II LG (MISCELLANEOUS) ×4 IMPLANT
SOL PREP PVP 2OZ (MISCELLANEOUS) ×4
SOLUTION PREP PVP 2OZ (MISCELLANEOUS) ×2 IMPLANT
SUT ETH BLK MONO 3 0 FS 1 12/B (SUTURE) IMPLANT
SUT ETHIBOND 2-0  6-8 CP-2 (SUTURE)
SUT ETHIBOND 2-0 6-8 CP-2 (SUTURE) IMPLANT
SUT ETHILON 3 0 FSLX (SUTURE) ×4 IMPLANT
SUT ORTHOCORD W/MULTIPK NDL (SUTURE) IMPLANT
SUT PDS PLUS 0 (SUTURE)
SUT PDS PLUS AB 0 CT-2 (SUTURE) IMPLANT
SUT PERFECTPASSER WHITE CART (SUTURE) IMPLANT
SUT SMART STITCH CARTRIDGE (SUTURE) IMPLANT
SUT VIC AB 2-0 CT2 27 (SUTURE) IMPLANT
SUT VIC AB 4-0 SH 27 (SUTURE)
SUT VIC AB 4-0 SH 27XANBCTRL (SUTURE) IMPLANT
SUT VICRYL 3-0 27IN (SUTURE) ×4 IMPLANT
SUTURE MAGNUM WIRE 2X48 BLK (SUTURE) ×16 IMPLANT
SYR 20CC LL (SYRINGE) ×4 IMPLANT
SYR 30ML LL (SYRINGE) ×4 IMPLANT
SYR 50ML LL SCALE MARK (SYRINGE) ×4 IMPLANT
TUBING ARTHRO INFLOW-ONLY STRL (TUBING) ×4 IMPLANT
TUBING CONNECTING 10 (TUBING) ×3 IMPLANT
TUBING CONNECTING 10' (TUBING) ×1
WAND HAND CNTRL MULTIVAC 90 (MISCELLANEOUS) ×4 IMPLANT

## 2014-12-16 NOTE — Anesthesia Preprocedure Evaluation (Signed)
Anesthesia Evaluation  Patient identified by MRN, date of birth, ID band Patient awake    Reviewed: Allergy & Precautions, H&P , NPO status , Patient's Chart, lab work & pertinent test results, reviewed documented beta blocker date and time   History of Anesthesia Complications (+) PONV and history of anesthetic complications  Airway Mallampati: II  TM Distance: >3 FB Neck ROM: full    Dental no notable dental hx. (+) Teeth Intact, Caps Caps in the front on the top:   Pulmonary neg pulmonary ROS,  breath sounds clear to auscultation  Pulmonary exam normal       Cardiovascular Exercise Tolerance: Good hypertension, On Medications - angina- CAD, - Past MI and - CABG negative cardio ROS Normal cardiovascular exam- dysrhythmias - Valvular Problems/MurmursRhythm:regular Rate:Normal     Neuro/Psych TIAnegative psych ROS   GI/Hepatic negative GI ROS, Neg liver ROS,   Endo/Other  negative endocrine ROS  Renal/GU negative Renal ROS  negative genitourinary   Musculoskeletal   Abdominal   Peds  Hematology negative hematology ROS (+)   Anesthesia Other Findings Past Medical History:   HBP (high blood pressure)                                    Muscle pain                                                  Change in hair                                               Discolored nails                                               Comment:TOENAILS   Stroke                                          2006           Comment:tia   Arthritis                                                    Shortness of breath dyspnea                                    Comment:with exertion   Reproductive/Obstetrics negative OB ROS                             Anesthesia Physical Anesthesia Plan  ASA: II  Anesthesia Plan: General   Post-op Pain Management:    Induction:   Airway Management Planned:   Additional  Equipment:   Intra-op Plan:  Post-operative Plan:   Informed Consent: I have reviewed the patients History and Physical, chart, labs and discussed the procedure including the risks, benefits and alternatives for the proposed anesthesia with the patient or authorized representative who has indicated his/her understanding and acceptance.   Dental Advisory Given  Plan Discussed with: Anesthesiologist, CRNA and Surgeon  Anesthesia Plan Comments:         Anesthesia Quick Evaluation

## 2014-12-16 NOTE — Anesthesia Procedure Notes (Addendum)
Procedure Name: Intubation Performed by: Sinda Du Pre-anesthesia Checklist: Patient identified, Patient being monitored, Timeout performed, Emergency Drugs available and Suction available Patient Re-evaluated:Patient Re-evaluated prior to inductionOxygen Delivery Method: Circle system utilized Preoxygenation: Pre-oxygenation with 100% oxygen Intubation Type: IV induction Ventilation: Mask ventilation without difficulty Laryngoscope Size: Mac and 3 Grade View: Grade I Tube type: Oral Tube size: 7.0 mm Number of attempts: 1 Airway Equipment and Method: Stylet Placement Confirmation: ETT inserted through vocal cords under direct vision,  positive ETCO2 and breath sounds checked- equal and bilateral Secured at: 21 cm Tube secured with: Tape Dental Injury: Teeth and Oropharynx as per pre-operative assessment    Anesthesia Regional Block:  Interscalene brachial plexus block  Pre-Anesthetic Checklist: ,, timeout performed, Correct Patient, Correct Site, Correct Laterality, Correct Procedure, Correct Position, site marked, Risks and benefits discussed,  Surgical consent,  Pre-op evaluation,  At surgeon's request and post-op pain management  Laterality: Left and Upper  Prep: Betadine       Needles:  Injection technique: Single-shot  Needle Type: Echogenic Stimulator Needle     Needle Length: 10cm 10 cm Needle Gauge: 21 and 21 G    Additional Needles:  Procedures: ultrasound guided (picture in chart) Interscalene brachial plexus block Narrative:  Start time: 12/16/2014 11:20 AM End time: 12/16/2014 11:25 AM Injection made incrementally with aspirations every 5 mL.  Performed by: Personally  Anesthesiologist: Martha Clan  Additional Notes: No immediate complications

## 2014-12-16 NOTE — Anesthesia Postprocedure Evaluation (Signed)
  Anesthesia Post-op Note  Patient: Kristina Banks  Procedure(s) Performed: Procedure(s): ARTHROSCOPIC ROTATOR CUFF REPAIR (Left)  Anesthesia type:General  Patient location: PACU  Post pain: Pain level controlled  Post assessment: Post-op Vital signs reviewed, Patient's Cardiovascular Status Stable, Respiratory Function Stable, Patent Airway and No signs of Nausea or vomiting  Post vital signs: Reviewed and stable  Last Vitals:  Filed Vitals:   12/16/14 1148  BP:   Pulse:   Temp: 36.7 C  Resp:     Level of consciousness: awake, alert  and patient cooperative  Complications: No apparent anesthesia complications

## 2014-12-16 NOTE — Discharge Instructions (Signed)
Start pendulum exercises tomorrow.   Change dressings tomorrow to band aids.   Ice aggressively.                                                          AMBULATORY SURGERY  DISCHARGE INSTRUCTIONS   1) The drugs that you were given will stay in your system until tomorrow so for the next 24 hours you should not:  A) Drive an automobile B) Make any legal decisions C) Drink any alcoholic beverage   2) You may resume regular meals tomorrow.  Today it is better to start with liquids and gradually work up to solid foods.  You may eat anything you prefer, but it is better to start with liquids, then soup and crackers, and gradually work up to solid foods.   3) Please notify your doctor immediately if you have any unusual bleeding, trouble breathing, redness and pain at the surgery site, drainage, fever, or pain not relieved by medication.    4) Additional Instructions:        Please contact your physician with any problems or Same Day Surgery at [phone], Monday through Friday 6 am to 4 pm, or [place] at [facility] number at [phone].

## 2014-12-16 NOTE — Op Note (Signed)
12/16/2014  9:39 AM  PATIENT:  Kristina Banks    PRE-OPERATIVE DIAGNOSIS:  FULL THICKNESS ROTATOR CUFF REPAIR RECURRENT  POST-OPERATIVE DIAGNOSIS:  Same  PROCEDURE:  ARTHROSCOPIC ROTATOR CUFF REPAIR  SURGEON:  Park Breed, MD   .  ANESTHESIA:   General endotracheal  PREOPERATIVE INDICATIONS:  Kristina Banks is a  74 y.o. female with a diagnosis of [place] who failed conservative measures and elected for surgical management.    The risks benefits and alternatives were discussed with the patient preoperatively including but not limited to the risks of infection, bleeding, nerve injury, cardiopulmonary complications, the need for revision surgery, among others, and the patient was willing to proceed.  OPERATIVE IMPLANTS: 1 Arthrocare Multifix anchor  OPERATIVE FINDINGS: The patient had a recurrent tear of the rotator cuff from a fall following repair in February.  There was a crescent shaped re-tear of the supraspinatus. Moderate adhesions.    OPERATIVE PROCEDURE: The patient was brought to the operating room where     satisfactory general endotracheal anesthesia was accomplished.The patient was turned into the lateral decubitus position and the shoulder was prepped and draped in a sterile fashion. Arthroscopy was carried out from a posterior portal with accessory sensory portals laterally. The  joint was examined first. The joint surfaces were intact and the biceps had been tenotomized.  A large tear in the cuff was visible.    The arthroscope was redirected into the subacromial space.  Adhesions were debrided and the cuff probed.  The tear was visualized and I felt that it could be repaired with some convergence sutures and an anchor laterally.    2 Magnum wire sutures were placed medially side to side to close a portion of the medial cuff. 2 more Magnum wires were placed more laterally and were then tightened down using an Arthrocare Multifix anchor.  This  brought the cuff together and down laterally. The opening in the cuff was eliminated.   Final debridement was carried out with the motorized shaver and the ArthroCare wand. The joint was flushed and the stab wounds and closed with 3-0 nylon suture. Sponge and needle counts were correct.   The dry sterile dressing and was applied along with a padded sling. Patient was awakened and taken recovery in good condition.   [name].

## 2014-12-16 NOTE — H&P (Signed)
THE PATIENT WAS SEEN IN THE HOLDING AREA.  HISTORY, ALLERGIES, HOME MEDICATIONS AND OPERATIVE PROCEDURE WERE REVIEWED. RISKS AND BENEFITS OF SURGERY DISCUSSED WITH PATIENT AGAIN.  NO CHANGES FROM INITIAL HISTORY AND PHYSICAL NOTED.    

## 2014-12-16 NOTE — Transfer of Care (Signed)
Immediate Anesthesia Transfer of Care Note  Patient: Kristina Banks  Procedure(s) Performed: Procedure(s): ARTHROSCOPIC ROTATOR CUFF REPAIR (Left)  Patient Location: PACU  Anesthesia Type:General  Level of Consciousness: awake, alert  and oriented  Airway & Oxygen Therapy: Patient Spontanous Breathing  Post-op Assessment: Report given to RN  Post vital signs: stable  Last Vitals:  Filed Vitals:   12/16/14 0620  BP: 160/70  Pulse: 68  Temp: 36.9 C  Resp: 14    Complications: No apparent anesthesia complications

## 2015-05-14 ENCOUNTER — Other Ambulatory Visit: Payer: Self-pay | Admitting: Surgery

## 2015-05-14 ENCOUNTER — Ambulatory Visit
Admission: RE | Admit: 2015-05-14 | Discharge: 2015-05-14 | Disposition: A | Discharge status: Home / Self Care | Payer: Medicare Other | Source: Ambulatory Visit | Attending: Surgery | Admitting: Surgery

## 2015-05-14 DIAGNOSIS — R1030 Lower abdominal pain, unspecified: Secondary | ICD-10-CM | POA: Diagnosis not present

## 2015-05-14 DIAGNOSIS — K5730 Diverticulosis of large intestine without perforation or abscess without bleeding: Secondary | ICD-10-CM | POA: Diagnosis not present

## 2015-05-14 DIAGNOSIS — I878 Other specified disorders of veins: Secondary | ICD-10-CM | POA: Diagnosis not present

## 2015-05-14 DIAGNOSIS — I700 Atherosclerosis of aorta: Secondary | ICD-10-CM | POA: Insufficient documentation

## 2015-05-14 DIAGNOSIS — R1032 Left lower quadrant pain: Secondary | ICD-10-CM

## 2015-05-14 DIAGNOSIS — K760 Fatty (change of) liver, not elsewhere classified: Secondary | ICD-10-CM | POA: Diagnosis not present

## 2015-05-14 MED ORDER — IOHEXOL 300 MG/ML  SOLN
125.0000 mL | Freq: Once | INTRAMUSCULAR | Status: AC | PRN
  Administered 2015-05-14: 125 mL via INTRAVENOUS

## 2015-06-14 ENCOUNTER — Emergency Department (HOSPITAL_COMMUNITY): Payer: Medicare Other

## 2015-06-14 ENCOUNTER — Observation Stay (HOSPITAL_COMMUNITY)
Admission: EM | Admit: 2015-06-14 | Discharge: 2015-06-16 | Disposition: A | Discharge status: Home / Self Care | Payer: Medicare Other | Attending: Oncology | Admitting: Oncology

## 2015-06-14 ENCOUNTER — Encounter (HOSPITAL_COMMUNITY): Payer: Self-pay | Admitting: *Deleted

## 2015-06-14 DIAGNOSIS — Z79899 Other long term (current) drug therapy: Secondary | ICD-10-CM | POA: Insufficient documentation

## 2015-06-14 DIAGNOSIS — Z872 Personal history of diseases of the skin and subcutaneous tissue: Secondary | ICD-10-CM | POA: Insufficient documentation

## 2015-06-14 DIAGNOSIS — M1990 Unspecified osteoarthritis, unspecified site: Secondary | ICD-10-CM | POA: Diagnosis not present

## 2015-06-14 DIAGNOSIS — Z7982 Long term (current) use of aspirin: Secondary | ICD-10-CM | POA: Diagnosis not present

## 2015-06-14 DIAGNOSIS — Z791 Long term (current) use of non-steroidal anti-inflammatories (NSAID): Secondary | ICD-10-CM | POA: Diagnosis not present

## 2015-06-14 DIAGNOSIS — Z8673 Personal history of transient ischemic attack (TIA), and cerebral infarction without residual deficits: Secondary | ICD-10-CM | POA: Diagnosis not present

## 2015-06-14 DIAGNOSIS — M25559 Pain in unspecified hip: Secondary | ICD-10-CM | POA: Diagnosis present

## 2015-06-14 DIAGNOSIS — M545 Low back pain: Secondary | ICD-10-CM | POA: Insufficient documentation

## 2015-06-14 DIAGNOSIS — M549 Dorsalgia, unspecified: Secondary | ICD-10-CM | POA: Diagnosis present

## 2015-06-14 DIAGNOSIS — R110 Nausea: Secondary | ICD-10-CM | POA: Diagnosis not present

## 2015-06-14 DIAGNOSIS — I10 Essential (primary) hypertension: Secondary | ICD-10-CM | POA: Diagnosis not present

## 2015-06-14 DIAGNOSIS — R109 Unspecified abdominal pain: Secondary | ICD-10-CM | POA: Diagnosis present

## 2015-06-14 DIAGNOSIS — R1032 Left lower quadrant pain: Principal | ICD-10-CM | POA: Diagnosis present

## 2015-06-14 DIAGNOSIS — M5416 Radiculopathy, lumbar region: Secondary | ICD-10-CM | POA: Diagnosis present

## 2015-06-14 LAB — BASIC METABOLIC PANEL
Anion gap: 14 (ref 5–15)
BUN: 22 mg/dL — ABNORMAL HIGH (ref 6–20)
CALCIUM: 9.3 mg/dL (ref 8.9–10.3)
CO2: 22 mmol/L (ref 22–32)
CREATININE: 0.7 mg/dL (ref 0.44–1.00)
Chloride: 97 mmol/L — ABNORMAL LOW (ref 101–111)
GFR calc Af Amer: >60 mL/min (ref >60)
GFR calc non Af Amer: >60 mL/min (ref >60)
GLUCOSE: 162 mg/dL — AB (ref 65–99)
Potassium: 3.8 mmol/L (ref 3.5–5.1)
Sodium: 133 mmol/L — ABNORMAL LOW (ref 135–145)

## 2015-06-14 LAB — URINALYSIS, ROUTINE W REFLEX MICROSCOPIC
BILIRUBIN URINE: NEGATIVE
Glucose, UA: NEGATIVE mg/dL
Hgb urine dipstick: NEGATIVE
KETONES UR: NEGATIVE mg/dL
Nitrite: NEGATIVE
PH: 6.5 (ref 5.0–8.0)
Protein, ur: NEGATIVE mg/dL
Specific Gravity, Urine: 1.023 (ref 1.005–1.030)

## 2015-06-14 LAB — CBC WITH DIFFERENTIAL/PLATELET
Basophils Absolute: 0.1 10*3/uL (ref 0.0–0.1)
Basophils Relative: 1 %
Eosinophils Absolute: 0.1 10*3/uL (ref 0.0–0.7)
Eosinophils Relative: 1 %
HEMATOCRIT: 44.8 % (ref 36.0–46.0)
Hemoglobin: 15.4 g/dL — ABNORMAL HIGH (ref 12.0–15.0)
LYMPHS ABS: 2.2 10*3/uL (ref 0.7–4.0)
LYMPHS PCT: 20 %
MCH: 30.3 pg (ref 26.0–34.0)
MCHC: 34.4 g/dL (ref 30.0–36.0)
MCV: 88.2 fL (ref 78.0–100.0)
MONO ABS: 0.8 10*3/uL (ref 0.1–1.0)
MONOS PCT: 7 %
NEUTROS ABS: 7.8 10*3/uL — AB (ref 1.7–7.7)
Neutrophils Relative %: 71 %
Platelets: 235 10*3/uL (ref 150–400)
RBC: 5.08 MIL/uL (ref 3.87–5.11)
RDW: 13 % (ref 11.5–15.5)
WBC: 11 10*3/uL — ABNORMAL HIGH (ref 4.0–10.5)

## 2015-06-14 LAB — URINE MICROSCOPIC-ADD ON

## 2015-06-14 LAB — LIPASE, BLOOD: Lipase: 23 U/L (ref 11–51)

## 2015-06-14 MED ORDER — IBUPROFEN 600 MG PO TABS: 600.0000 mg | ORAL_TABLET | Freq: Once | ORAL | Status: DC

## 2015-06-14 MED ORDER — HYDROMORPHONE HCL 1 MG/ML IJ SOLN: 1.0000 mg | Freq: Once | INTRAMUSCULAR | Status: DC

## 2015-06-14 MED ORDER — ONDANSETRON HCL 4 MG PO TABS: 4.0000 mg | ORAL_TABLET | Freq: Four times a day (QID) | ORAL | Status: DC | PRN

## 2015-06-14 MED ORDER — ASPIRIN 81 MG PO CHEW
81.0000 mg | CHEWABLE_TABLET | Freq: Every day | ORAL | Status: DC
  Administered 2015-06-15 – 2015-06-16 (×2): 81 mg via ORAL
  Filled 2015-06-14 (×2): qty 1

## 2015-06-14 MED ORDER — METRONIDAZOLE IN NACL 5-0.79 MG/ML-% IV SOLN
500.0000 mg | Freq: Once | INTRAVENOUS | Status: AC
  Administered 2015-06-14: 500 mg via INTRAVENOUS
  Filled 2015-06-14: qty 100

## 2015-06-14 MED ORDER — ENOXAPARIN SODIUM 40 MG/0.4ML ~~LOC~~ SOLN
40.0000 mg | SUBCUTANEOUS | Status: DC
  Administered 2015-06-14 – 2015-06-15 (×2): 40 mg via SUBCUTANEOUS
  Filled 2015-06-14 (×2): qty 0.4

## 2015-06-14 MED ORDER — ONDANSETRON HCL 4 MG/2ML IJ SOLN
4.0000 mg | Freq: Once | INTRAMUSCULAR | Status: AC
  Administered 2015-06-14: 4 mg via INTRAVENOUS
  Filled 2015-06-14: qty 2

## 2015-06-14 MED ORDER — DEXTROSE 5 % IV SOLN
1.0000 g | INTRAVENOUS | Status: DC
  Administered 2015-06-14: 1 g via INTRAVENOUS
  Filled 2015-06-14 (×2): qty 10

## 2015-06-14 MED ORDER — HYDROMORPHONE HCL 1 MG/ML IJ SOLN
0.5000 mg | INTRAMUSCULAR | Status: DC | PRN
  Administered 2015-06-14 – 2015-06-15 (×3): 0.5 mg via INTRAVENOUS
  Filled 2015-06-14 (×3): qty 1

## 2015-06-14 MED ORDER — POLYETHYLENE GLYCOL 3350 17 G PO PACK: 17.0000 g | PACK | Freq: Every day | ORAL | Status: DC | PRN

## 2015-06-14 MED ORDER — SODIUM CHLORIDE 0.9 % IV BOLUS (SEPSIS)
1000.0000 mL | Freq: Once | INTRAVENOUS | Status: AC
  Administered 2015-06-14: 1000 mL via INTRAVENOUS

## 2015-06-14 MED ORDER — MORPHINE SULFATE (PF) 4 MG/ML IV SOLN
4.0000 mg | Freq: Once | INTRAVENOUS | Status: AC
  Administered 2015-06-14: 4 mg via INTRAVENOUS
  Filled 2015-06-14: qty 1

## 2015-06-14 MED ORDER — LORAZEPAM 2 MG/ML IJ SOLN
0.5000 mg | Freq: Once | INTRAMUSCULAR | Status: AC
  Administered 2015-06-14: 0.5 mg via INTRAVENOUS
  Filled 2015-06-14: qty 1

## 2015-06-14 MED ORDER — CIPROFLOXACIN IN D5W 400 MG/200ML IV SOLN
400.0000 mg | Freq: Once | INTRAVENOUS | Status: AC
  Administered 2015-06-14: 400 mg via INTRAVENOUS
  Filled 2015-06-14: qty 200

## 2015-06-14 MED ORDER — ONDANSETRON HCL 4 MG/2ML IJ SOLN
4.0000 mg | Freq: Four times a day (QID) | INTRAMUSCULAR | Status: DC | PRN
  Administered 2015-06-14 – 2015-06-15 (×3): 4 mg via INTRAVENOUS
  Filled 2015-06-14 (×3): qty 2

## 2015-06-14 MED ORDER — HYDROMORPHONE HCL 1 MG/ML IJ SOLN
0.5000 mg | Freq: Once | INTRAMUSCULAR | Status: AC
  Administered 2015-06-14: 0.5 mg via INTRAVENOUS
  Filled 2015-06-14: qty 1

## 2015-06-14 MED ORDER — HYDROCHLOROTHIAZIDE 25 MG PO TABS
25.0000 mg | ORAL_TABLET | Freq: Every day | ORAL | Status: DC
  Administered 2015-06-15 – 2015-06-16 (×2): 25 mg via ORAL
  Filled 2015-06-14 (×3): qty 1

## 2015-06-14 NOTE — ED Notes (Signed)
Attempted report 

## 2015-06-14 NOTE — ED Notes (Signed)
Water and crackers at bedside for PO challenge

## 2015-06-14 NOTE — ED Notes (Signed)
Internal medicine at bedside

## 2015-06-14 NOTE — ED Notes (Signed)
Pt given water to drink. Pt denies improvement with dilaudid although appears more comfortable. Family expressing concerns of continued pain even with medication.

## 2015-06-14 NOTE — ED Notes (Signed)
MD at bedside. 

## 2015-06-14 NOTE — ED Notes (Addendum)
Pt to ED c/o L sided back pain onset last night. Denies injury to area. Able to stand, increased pain, with assistance. Pt has tried heating backs, tylenol, and ibuprofen without relief.

## 2015-06-14 NOTE — ED Provider Notes (Signed)
CSN: [id number]     Arrival date & time 06/14/15  0430 History   First MD Initiated Contact with Patient 06/14/15 [phone]     Chief Complaint  Patient presents with  . Back Pain     (Consider location/radiation/quality/duration/timing/severity/associated sxs/prior Treatment) HPI  This is a 75 year old female who presents with back and left lower quadrant pain. Patient reports onset of symptoms last night. She states that initially it was dull but became sharp and increased. She reports similar pain in the past. She was evaluated by surgery for possible hernia and had a negative CT scan at that time. No history of kidney stones. Denies any dysuria or hematuria.  Reports nausea without vomiting. Maybe has had some loose stools. Denies any fevers. Patient took ibuprofen and Tylenol without relief. Denies any injury.  Past Medical History  Diagnosis Date  . HBP (high blood pressure)   . Muscle pain   . Change in hair   . Discolored nails     TOENAILS  . 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 Arthritis   . Shortness of breath dyspnea     with exertion   Past Surgical History  Procedure Laterality Date  . Arm surgery Left     BONE TUMOR  . Foot surgery Right     10.3.14  . Bone tumor removed      Lt arm  . Abdominal hysterectomy    . Fractured knee Left   . Arthoscopic rotaor cuff repair Left 12/16/2014    Procedure: ARTHROSCOPIC ROTATOR CUFF REPAIR;  Surgeon: Earnestine Leys, MD;  Location: [facility] ORS;  Service: Orthopedics;  Laterality: Left;   History reviewed. No pertinent family history. Social History  Substance Use Topics  . Smoking status: Never Smoker   . Smokeless tobacco: Never Used  . Alcohol Use: No   OB History    No data available     Review of Systems  Constitutional: Negative for fever.  Respiratory: Negative for chest tightness and shortness of breath.   Cardiovascular: Negative for chest pain.  Gastrointestinal: Positive for nausea and abdominal pain. Negative for  vomiting.  Genitourinary: Negative for dysuria and hematuria.  Musculoskeletal: Positive for back pain.  Skin: Negative for rash.  Neurological: Negative for headaches.  All other systems reviewed and are negative.     Allergies  Codeine and Oxycodone  Home Medications   Prior to Admission medications   Medication Sig Start Date End Date Taking? Authorizing Provider  aspirin 81 MG tablet Take 81 mg by mouth daily.   Yes Historical Provider, MD  Calcium Carbonate-Vitamin D (CALCIUM 600 + D PO) Take by mouth daily.   Yes Historical Provider, MD  gabapentin (NEURONTIN) 100 MG capsule 100 mg daily.    Yes Historical Provider, MD  hydrochlorothiazide (HYDRODIURIL) 25 MG tablet Take 25 mg by mouth daily.   Yes Historical Provider, MD  meloxicam (MOBIC) 15 MG tablet Take 15 mg by mouth daily.   Yes Historical Provider, MD  Omega-3 Fatty Acids (FISH OIL) 1200 MG CAPS Take 1 capsule by mouth daily.   Yes Historical Provider, MD  neomycin-polymyxin-hydrocortisone (CORTISPORIN) otic solution Apply one to two drops to toe after soaking twice daily. Patient not taking: Reported on 12/16/2014 11/24/13   Max T Hyatt, DPM  traMADol (ULTRAM) 50 MG tablet Take 1 tablet (50 mg total) by mouth every 6 (six) hours as needed for moderate pain. Patient not taking: Reported on 06/14/2015 12/16/14   Earnestine Leys, MD  BP 155/75 mmHg  Pulse 70  Temp(Src) 97.9 F (36.6 C) (Oral)  Resp 16  SpO2 92% Physical Exam  Constitutional: She is oriented to person, place, and time. She appears well-developed and well-nourished.  Uncomfortable appearing  HENT:  Head: Normocephalic and atraumatic.  Cardiovascular: Normal rate, regular rhythm and normal heart sounds.   No murmur heard. Pulmonary/Chest: Effort normal and breath sounds normal. No respiratory distress. She has no wheezes.  Abdominal: Soft. Bowel sounds are normal. There is tenderness. There is no rebound and no guarding.  Left lower quadrant tenderness to  palpation, no bulge noted, no peritonitis  Neurological: She is alert and oriented to person, place, and time.  Skin: Skin is warm and dry.  Psychiatric: She has a normal mood and affect.  Nursing note and vitals reviewed.   ED Course  Procedures (including critical care time) Labs Review Labs Reviewed  URINALYSIS, ROUTINE W REFLEX MICROSCOPIC (NOT AT [facility]) - Abnormal; Notable for the following:    APPearance CLOUDY (*)    Leukocytes, UA SMALL (*)    All other components within normal limits  CBC WITH DIFFERENTIAL/PLATELET - Abnormal; Notable for the following:    WBC 11.0 (*)    Hemoglobin 15.4 (*)    Neutro Abs 7.8 (*)    All other components within normal limits  BASIC METABOLIC PANEL - Abnormal; Notable for the following:    Sodium 133 (*)    Chloride 97 (*)    Glucose, Bld 162 (*)    BUN 22 (*)    All other components within normal limits  URINE MICROSCOPIC-ADD ON - Abnormal; Notable for the following:    Squamous Epithelial / LPF 0-5 (*)    Bacteria, UA FEW (*)    All other components within normal limits  URINE CULTURE    Imaging Review Ct Renal Stone Study  06/14/2015  CLINICAL DATA:  Initial evaluation for acute left flank and groin pain. EXAM: CT ABDOMEN AND PELVIS WITHOUT CONTRAST TECHNIQUE: Multidetector CT imaging of the abdomen and pelvis was performed following the standard protocol without IV contrast. COMPARISON:  Prior CT from 05/14/2015. FINDINGS: Mild subsegmental atelectasis present within the visualized lung bases. Visualized lungs are otherwise clear. Aortic valvular calcifications partially visualized. No pleural or pericardial effusion. Limited noncontrast evaluation of the liver is unremarkable. Gallbladder within normal limits. No biliary dilatation. Spleen, adrenal glands, and pancreas demonstrate a normal unenhanced appearance. Kidneys are equal in size without evidence of nephrolithiasis or hydronephrosis. No radiopaque calculi seen along the course of  either renal collecting system. There is no hydroureter. Subcentimeter hypodensity within the inferior left kidney too small the characterize, but statistically likely a small cyst. Small hiatal hernia noted. Stomach otherwise unremarkable. No evidence for bowel obstruction. Appendix is normal. Extensive sigmoid diverticulosis without evidence for acute diverticulitis. No acute inflammatory changes about the bowels. Bladder within normal limits. Uterus is absent. Ovaries not discretely identified. No free air or fluid. No pathologically enlarged intra-abdominal or pelvic lymph nodes. Prominent aorto bi-iliac atherosclerotic calcifications. No aneurysm. No acute osseous abnormality. No worrisome lytic or blastic osseous lesions. Sclerotic lesion within the L4 vertebral body most consistent with a benign bone island. Degenerative disc desiccation with disc bulge present at L5-S1. Probable disc bulges at L3-4 and L4-5 as well. IMPRESSION: 1. No CT evidence for nephrolithiasis or obstructive uropathy. No findings to explain patient's left flank/groin pain. 2. Sigmoid diverticulosis without acute diverticulitis. 3. Moderate aorto bi-iliac atheromatous disease.  No aneurysm. 4. Small hiatal  hernia. Electronically Signed   By: Jeannine Boga M.D.   On: 06/14/2015 06:01   I have personally reviewed and evaluated these images and lab results as part of my medical decision-making.   EKG Interpretation None      MDM   Final diagnoses:  Flank pain  LLQ pain    She presents with back pain and left lower quadrant pain. Fairly acute in onset. Only associated symptom is nausea.  She is very uncomfortable appearing on exam.  Minimal abdominal tenderness.  Reports history of similar left lower quadrant symptoms but no back pain. History somewhat concerning for kidney stones. CT renal stone study ordered. Patient given pain and nausea medication as well as fluids.  Lab work notable for mild leukocytosis and mild  hyponatremia. CT stone study negative for kidney stones. Urinalysis with 6-30 white cells and few bacteria. Patient has no urinary symptoms. Urine culture sent. She has reported persistent pain and nausea. She does have evidence of diverticulosis without diverticulitis. However, given loose stools and white blood count with left lower quadrant pain, feel is reasonable to treat for mild diverticulitis which would also cover for any urinary tract infection. She has no aneurysmal dilation of the aorta or free fluid. Think AAA is less likely. Patient was given Cipro and Flagyl. She continues to endorse pain. She remains minimally tender on exam with no signs of peritonitis. Will admit for pain control and antibiotics.      Merryl Hacker, MD 06/14/15 [phone]

## 2015-06-14 NOTE — ED Notes (Signed)
Pt taken to CT.

## 2015-06-14 NOTE — Progress Notes (Signed)
Patient arrived on unit via stretcher from ED.  Family at bedside.  

## 2015-06-14 NOTE — H&P (Signed)
Date: 06/14/2015               Patient Name:  Kristina Banks MRN: [id number]  DOB: Oct 10, 1940 Age / Sex: 75 y.o., female   PCP: Derinda Late, MD         Medical Service: Internal Medicine Teaching Service         Attending Physician: Dr. Annia Belt, MD    First Contact: Dr. Juleen China Pager: [phone]  Second Contact: Dr. Arcelia Jew Pager: [phone]       After Hours (After 5p/  First Contact Pager: [phone]  weekends / holidays): Second Contact Pager: [phone]   Chief Complaint: Left lower abdominal pain  History of Present Illness: Patient is a 72 Y O F with PMH of HTN, presented with complaints of abdominal pain that started yesterday afternoon, severe, left lower quadrant, 10/10 intensity, also with some associated abdominal pain. No radiation, worse with movements, short term some relief with morphine given in the ED. There is associated nausea and poor appetite, but no vomiting, no diarrhea, constipation or blood in stools, no fever or chills. No burning with urination or frequency. Pt has had a hysterectomy.  Pt has had prior episodes of similar pain- in October, saw her PCP, who felt she had a hernia, and then was referred to a surgeon, who ordered a CT scan of her abdomen with contrast which was negative for hernia but showed sigmoid diverticulosis.  Pt had a CT scan done in the ED- negative for diverticulitis and was started on antibiotics- Metronidazole and Ciprofloxacin, Internal medicince was called to admit for pain control.  Meds: No current facility-administered medications for this encounter.   Current Outpatient Prescriptions  Medication Sig Dispense Refill  . aspirin 81 MG tablet Take 81 mg by mouth daily.    . Calcium Carbonate-Vitamin D (CALCIUM 600 + D PO) Take by mouth daily.    Marland Kitchen gabapentin (NEURONTIN) 100 MG capsule 100 mg daily.     . hydrochlorothiazide (HYDRODIURIL) 25 MG tablet Take 25 mg by mouth daily.    . meloxicam (MOBIC) 15 MG tablet Take 15 mg by  mouth daily.    . Omega-3 Fatty Acids (FISH OIL) 1200 MG CAPS Take 1 capsule by mouth daily.    Marland Kitchen neomycin-polymyxin-hydrocortisone (CORTISPORIN) otic solution Apply one to two drops to toe after soaking twice daily. (Patient not taking: Reported on 12/16/2014) 10 mL 0  . traMADol (ULTRAM) 50 MG tablet Take 1 tablet (50 mg total) by mouth every 6 (six) hours as needed for moderate pain. (Patient not taking: Reported on 06/14/2015) 60 tablet 3    Allergies: Allergies as of 06/14/2015 - Review Complete 06/14/2015  Allergen Reaction Noted  . Codeine Nausea And Vomiting 02/14/2013  . Oxycodone Other (See Comments) 12/08/2014   Past Medical History  Diagnosis Date  . HBP (high blood pressure)   . Muscle pain   . Change in hair   . Discolored nails     TOENAILS  . 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 Arthritis   . Shortness of breath dyspnea     with exertion   Past Surgical History  Procedure Laterality Date  . Arm surgery Left     BONE TUMOR  . Foot surgery Right     10.3.14  . Bone tumor removed      Lt arm  . Abdominal hysterectomy    . Fractured knee Left   . Arthoscopic rotaor cuff repair Left 12/16/2014  Procedure: ARTHROSCOPIC ROTATOR CUFF REPAIR;  Surgeon: Earnestine Leys, MD;  Location: [facility] ORS;  Service: Orthopedics;  Laterality: Left;   History reviewed. No pertinent family history. Social History   Social History  . Marital Status: Married    Spouse Name: N/A  . Number of Children: N/A  . Years of Education: N/A   Occupational History  . Not on file.   Social History Main Topics  . Smoking status: Never Smoker   . Smokeless tobacco: Never Used  . Alcohol Use: No  . Drug Use: No  . Sexual Activity: Not on file   Other Topics Concern  . Not on file   Social History Narrative    Review of Systems: CONSTITUTIONAL- No Fever,  or change in appetite. SKIN- No Rash, colour changes or itching. HEAD- No Headache or dizziness. RESPIRATORY- Mild hoarse voice- pt  attributes to talking a lot, as she just lost her husband, with mild non productive cough or no SOB. CARDIAC- No Palpitations, or chest pain. NEUROLOGIC- No Numbness, syncope, seizures or burning. [facility]- Denies depression or anxiety.  Physical Exam: Blood pressure 140/69, pulse 67, temperature 97.9 F (36.6 C), temperature source Oral, resp. rate 18, SpO2 94 %. GENERAL- alert, co-operative, appears as stated age, not in any distress. HEENT- Atraumatic, normocephalic, neck supple. CARDIAC- RRR, no murmurs, rubs or gallops. RESP- clear to auscultation bilaterally, no wheezes or crackles. ABDOMEN- Soft, tenderness left lower quadrant, , no guarding or rebound, no palpable masses or organomegaly, bowel sounds present. BACK- Normal curvature of the spine, No tenderness along the vertebrae, no CVA tenderness. NEURO- No obvious Cr N abnormality, alert and oriented, moving all extremities EXTREMITIES- pulse 2+, symmetric, no pedal edema. SKIN- Warm, dry, No rash or lesion. PSYCH- Normal mood and affect, appropriate thought content and speech.  Lab results: Basic Metabolic Panel:  Recent Labs  06/14/15 0500  NA 133*  K 3.8  CL 97*  CO2 22  GLUCOSE 162*  BUN 22*  CREATININE 0.70  CALCIUM 9.3   CBC:  Recent Labs  06/14/15 0500  WBC 11.0*  NEUTROABS 7.8*  HGB 15.4*  HCT 44.8  MCV 88.2  PLT 235   Urinalysis:  Recent Labs  06/14/15 0557  COLORURINE YELLOW  LABSPEC 1.023  PHURINE 6.5  GLUCOSEU NEGATIVE  HGBUR NEGATIVE  BILIRUBINUR NEGATIVE  KETONESUR NEGATIVE  PROTEINUR NEGATIVE  NITRITE NEGATIVE  LEUKOCYTESUR SMALL*   Imaging results:  Ct Renal Stone Study  06/14/2015  CLINICAL DATA:  Initial evaluation for acute left flank and groin pain. EXAM: CT ABDOMEN AND PELVIS WITHOUT CONTRAST TECHNIQUE: Multidetector CT imaging of the abdomen and pelvis was performed following the standard protocol without IV contrast. COMPARISON:  Prior CT from 05/14/2015. FINDINGS: Mild  subsegmental atelectasis present within the visualized lung bases. Visualized lungs are otherwise clear. Aortic valvular calcifications partially visualized. No pleural or pericardial effusion. Limited noncontrast evaluation of the liver is unremarkable. Gallbladder within normal limits. No biliary dilatation. Spleen, adrenal glands, and pancreas demonstrate a normal unenhanced appearance. Kidneys are equal in size without evidence of nephrolithiasis or hydronephrosis. No radiopaque calculi seen along the course of either renal collecting system. There is no hydroureter. Subcentimeter hypodensity within the inferior left kidney too small the characterize, but statistically likely a small cyst. Small hiatal hernia noted. Stomach otherwise unremarkable. No evidence for bowel obstruction. Appendix is normal. Extensive sigmoid diverticulosis without evidence for acute diverticulitis. No acute inflammatory changes about the bowels. Bladder within normal limits. Uterus is absent. Ovaries not  discretely identified. No free air or fluid. No pathologically enlarged intra-abdominal or pelvic lymph nodes. Prominent aorto bi-iliac atherosclerotic calcifications. No aneurysm. No acute osseous abnormality. No worrisome lytic or blastic osseous lesions. Sclerotic lesion within the L4 vertebral body most consistent with a benign bone island. Degenerative disc desiccation with disc bulge present at L5-S1. Probable disc bulges at L3-4 and L4-5 as well. IMPRESSION: 1. No CT evidence for nephrolithiasis or obstructive uropathy. No findings to explain patient's left flank/groin pain. 2. Sigmoid diverticulosis without acute diverticulitis. 3. Moderate aorto bi-iliac atheromatous disease.  No aneurysm. 4. Small hiatal hernia. Electronically Signed   By: Jeannine Boga M.D.   On: 06/14/2015 06:01    Other results: EKG: None  Assessment & Plan by Problem: Principal Problem:   Abdominal pain Active Problems:   HTN  (hypertension)  Abdominal pain- with mild leukocytosis- 11. Differntials- Diverticulitis, UTI- Cystitis, Colitis. Ct scan- 05/14/2015- showed diverticulosis but patients severity of pain not supportive that thi sis the cause of her pain, negative for hernia. UA- with small leukocytes and few bacteria, though patient lack typical symptom so UTI. Absence of diarrhea and negative Imaging findings do not support Colitis. Considering specificity of Ct findings in Diverticulitis is 99%, and patient has a mild leukocytosis-11, which could be a stress reaction, but has no other symptoms- such as fever, will discontinue antibiotics for Diverticulitis- Cipro and metronidazole and treat as a UTI. - Admit to Obs/med-surg - Start ceftriazone- Pm - D/c metronidazole and Cipro - Follow up urine culture - Ibuprofen 600mg  once - dilaudid 0.5mg  Q4H - CBC am  HTN- Blood pressure- now 144/66. Was initially as high as 198/94, likely due to pain. - Home meds- HCTZ- 25mg  daily, cont for now.  Elevated Blood sugars- CBg- 162 - Hga1c  Dispo: Disposition is deferred at this time, awaiting improvement of current medical problems.   Signed: Bethena Roys, MD 06/14/2015, 11:27 AM

## 2015-06-14 NOTE — ED Notes (Signed)
PT rates pain 6/10 and reports it is tolerable at this time.

## 2015-06-14 NOTE — ED Notes (Signed)
Dr. Horton at bedside. 

## 2015-06-14 NOTE — ED Notes (Signed)
Pt c/o nausea and dizziness; MD aware; pt given water for PO challenge

## 2015-06-15 ENCOUNTER — Observation Stay (HOSPITAL_COMMUNITY): Payer: Medicare Other

## 2015-06-15 DIAGNOSIS — M549 Dorsalgia, unspecified: Secondary | ICD-10-CM | POA: Diagnosis present

## 2015-06-15 DIAGNOSIS — M5116 Intervertebral disc disorders with radiculopathy, lumbar region: Secondary | ICD-10-CM

## 2015-06-15 DIAGNOSIS — I10 Essential (primary) hypertension: Secondary | ICD-10-CM

## 2015-06-15 DIAGNOSIS — M25559 Pain in unspecified hip: Secondary | ICD-10-CM | POA: Diagnosis present

## 2015-06-15 DIAGNOSIS — D72829 Elevated white blood cell count, unspecified: Secondary | ICD-10-CM

## 2015-06-15 DIAGNOSIS — R1032 Left lower quadrant pain: Secondary | ICD-10-CM

## 2015-06-15 DIAGNOSIS — M533 Sacrococcygeal disorders, not elsewhere classified: Secondary | ICD-10-CM | POA: Diagnosis not present

## 2015-06-15 DIAGNOSIS — M5416 Radiculopathy, lumbar region: Secondary | ICD-10-CM | POA: Diagnosis present

## 2015-06-15 DIAGNOSIS — R739 Hyperglycemia, unspecified: Secondary | ICD-10-CM

## 2015-06-15 LAB — URINE CULTURE

## 2015-06-15 LAB — CBC
HCT: 39.4 % (ref 36.0–46.0)
Hemoglobin: 13.1 g/dL (ref 12.0–15.0)
MCH: 29.6 pg (ref 26.0–34.0)
MCHC: 33.2 g/dL (ref 30.0–36.0)
MCV: 88.9 fL (ref 78.0–100.0)
PLATELETS: 229 10*3/uL (ref 150–400)
RBC: 4.43 MIL/uL (ref 3.87–5.11)
RDW: 13.1 % (ref 11.5–15.5)
WBC: 7.4 10*3/uL (ref 4.0–10.5)

## 2015-06-15 LAB — HEMOGLOBIN A1C
HEMOGLOBIN A1C: 6.1 % — AB (ref 4.8–5.6)
MEAN PLASMA GLUCOSE: 128 mg/dL

## 2015-06-15 MED ORDER — TRAMADOL HCL 50 MG PO TABS
50.0000 mg | ORAL_TABLET | Freq: Four times a day (QID) | ORAL | Status: DC | PRN
  Administered 2015-06-15 – 2015-06-16 (×2): 50 mg via ORAL
  Filled 2015-06-15 (×3): qty 1

## 2015-06-15 MED ORDER — MELOXICAM 15 MG PO TABS
15.0000 mg | ORAL_TABLET | Freq: Every day | ORAL | Status: DC
  Administered 2015-06-15 – 2015-06-16 (×2): 15 mg via ORAL
  Filled 2015-06-15 (×2): qty 1

## 2015-06-15 NOTE — Progress Notes (Signed)
Subjective: Patient seen and examined this morning on rounds.  No acute events overnight.  Still complaining of pain that is located in left inguinal/groin area as well as sacro-iliac joint area.  Nausea is better.  No episodes of vomiting.  Objective: Vital signs in last 24 hours: Filed Vitals:   06/14/15 1700 06/14/15 2009 06/15/15 0431 06/15/15 0752  BP: 157/84 149/66 164/74 110/84  Pulse: 69 62 88 74  Temp: 97.9 F (36.6 C) 97.7 F (36.5 C) 98.3 F (36.8 C) 98.2 F (36.8 C)  TempSrc: Oral Oral Oral Oral  Resp: 16 17 18 18   Height:      Weight:      SpO2: 94% 97% 94% 97%   Weight change:   Intake/Output Summary (Last 24 hours) at 06/15/15 1057 Last data filed at 06/15/15 0839  Gross per 24 hour  Intake    540 ml  Output   1075 ml  Net   -535 ml   General: resting in bed, no distress HEENT: EOMI, no scleral icterus Cardiac: RRR, no rubs, murmurs or gallops Pulm: clear to auscultation bilaterally, moving normal volumes of air Abd: soft, nontender, nondistended, BS present MSK: tenderness of hip at ASIS and inguinal area, point tenderness of SI joint, normal range of motion of hip joint and no pain ellicited with internal/external rotation Ext: warm and well perfused, no pedal edema Neuro: alert and oriented X3, cranial nerves II-XII grossly intact  Lab Results: Basic Metabolic Panel:  Recent Labs Lab 06/14/15 0500  NA 133*  K 3.8  CL 97*  CO2 22  GLUCOSE 162*  BUN 22*  CREATININE 0.70  CALCIUM 9.3   Liver Function Tests: No results for input(s): AST, ALT, ALKPHOS, BILITOT, PROT, ALBUMIN in the last 168 hours.  Recent Labs Lab 06/14/15 0500  LIPASE 23   No results for input(s): AMMONIA in the last 168 hours. CBC:  Recent Labs Lab 06/14/15 0500 06/15/15 0645  WBC 11.0* 7.4  NEUTROABS 7.8*  --   HGB 15.4* 13.1  HCT 44.8 39.4  MCV 88.2 88.9  PLT 235 229   Cardiac Enzymes: No results for input(s): CKTOTAL, CKMB, CKMBINDEX, TROPONINI in the  last 168 hours. BNP: No results for input(s): PROBNP in the last 168 hours. D-Dimer: No results for input(s): DDIMER in the last 168 hours. CBG: No results for input(s): GLUCAP in the last 168 hours. Hemoglobin A1C:  Recent Labs Lab 06/14/15 0500  HGBA1C 6.1*   Fasting Lipid Panel: No results for input(s): CHOL, HDL, LDLCALC, TRIG, CHOLHDL, LDLDIRECT in the last 168 hours. Thyroid Function Tests: No results for input(s): TSH, T4TOTAL, FREET4, T3FREE, THYROIDAB in the last 168 hours. Coagulation: No results for input(s): LABPROT, INR in the last 168 hours. Anemia Panel: No results for input(s): VITAMINB12, FOLATE, FERRITIN, TIBC, IRON, RETICCTPCT in the last 168 hours. Urine Drug Screen: Drugs of Abuse  No results found for: LABOPIA, COCAINSCRNUR, LABBENZ, AMPHETMU, THCU, LABBARB  Alcohol Level: No results for input(s): ETH in the last 168 hours. Urinalysis:  Recent Labs Lab 06/14/15 0557  COLORURINE YELLOW  LABSPEC 1.023  PHURINE 6.5  GLUCOSEU NEGATIVE  HGBUR NEGATIVE  BILIRUBINUR NEGATIVE  KETONESUR NEGATIVE  PROTEINUR NEGATIVE  NITRITE NEGATIVE  LEUKOCYTESUR SMALL*    Micro Results: No results found for this or any previous visit (from the past 240 hour(s)). Studies/Results: Ct Renal Stone Study  06/14/2015  CLINICAL DATA:  Initial evaluation for acute left flank and groin pain. EXAM: CT ABDOMEN AND PELVIS WITHOUT  CONTRAST TECHNIQUE: Multidetector CT imaging of the abdomen and pelvis was performed following the standard protocol without IV contrast. COMPARISON:  Prior CT from 05/14/2015. FINDINGS: Mild subsegmental atelectasis present within the visualized lung bases. Visualized lungs are otherwise clear. Aortic valvular calcifications partially visualized. No pleural or pericardial effusion. Limited noncontrast evaluation of the liver is unremarkable. Gallbladder within normal limits. No biliary dilatation. Spleen, adrenal glands, and pancreas demonstrate a normal  unenhanced appearance. Kidneys are equal in size without evidence of nephrolithiasis or hydronephrosis. No radiopaque calculi seen along the course of either renal collecting system. There is no hydroureter. Subcentimeter hypodensity within the inferior left kidney too small the characterize, but statistically likely a small cyst. Small hiatal hernia noted. Stomach otherwise unremarkable. No evidence for bowel obstruction. Appendix is normal. Extensive sigmoid diverticulosis without evidence for acute diverticulitis. No acute inflammatory changes about the bowels. Bladder within normal limits. Uterus is absent. Ovaries not discretely identified. No free air or fluid. No pathologically enlarged intra-abdominal or pelvic lymph nodes. Prominent aorto bi-iliac atherosclerotic calcifications. No aneurysm. No acute osseous abnormality. No worrisome lytic or blastic osseous lesions. Sclerotic lesion within the L4 vertebral body most consistent with a benign bone island. Degenerative disc desiccation with disc bulge present at L5-S1. Probable disc bulges at L3-4 and L4-5 as well. IMPRESSION: 1. No CT evidence for nephrolithiasis or obstructive uropathy. No findings to explain patient's left flank/groin pain. 2. Sigmoid diverticulosis without acute diverticulitis. 3. Moderate aorto bi-iliac atheromatous disease.  No aneurysm. 4. Small hiatal hernia. Electronically Signed   By: Jeannine Boga M.D.   On: 06/14/2015 06:01   Medications:  Scheduled Meds: . aspirin  81 mg Oral Daily  . enoxaparin (LOVENOX) injection  40 mg Subcutaneous Q24H  . hydrochlorothiazide  25 mg Oral Daily  . ibuprofen  600 mg Oral Once   Continuous Infusions:  PRN Meds:.HYDROmorphone (DILAUDID) injection, ondansetron **OR** ondansetron (ZOFRAN) IV, polyethylene glycol Assessment/Plan: Principal Problem:   Abdominal pain Active Problems:   HTN (hypertension)  Left Hip and Low Back Pain: leukocytosis has resolved this morning and  patient afebrile as well.  At this point, do not suspect an infectious process in the abdomen or urinary tract so will discontinue antibiotics.  Patient with history of osteoarthritis and previous orthopedic surgeries of her shoulder and cortisone injections of right hip.   - stop antibiotics - stop Dilaudid - start Meloxicam 15mg  daily - continue Zofran as needed - obtain x-ray of hip and sacrum, may need further imaging with MRI - outpatient orthopedic follow up  HTN: Blood pressure normalizing now at 110/84 this morning - Home meds are HCTZ 25mg  daily, continue for now.   Dispo: Disposition is deferred at this time, awaiting improvement of current medical problems.    The patient does have a current PCP Derinda Late, MD) and does not need an [facility] hospital follow-up appointment after discharge.  The patient does not have transportation limitations that hinder transportation to clinic appointments.  .Services Needed at time of discharge: Y = Yes, Blank = No PT:   OT:   RN:   Equipment:   Other:       Jule Ser, DO 06/15/2015, 10:57 AM

## 2015-06-16 DIAGNOSIS — R1032 Left lower quadrant pain: Secondary | ICD-10-CM | POA: Diagnosis not present

## 2015-06-16 LAB — BASIC METABOLIC PANEL
Anion gap: 7 (ref 5–15)
BUN: 13 mg/dL (ref 6–20)
CHLORIDE: 100 mmol/L — AB (ref 101–111)
CO2: 28 mmol/L (ref 22–32)
CREATININE: 0.74 mg/dL (ref 0.44–1.00)
Calcium: 8.5 mg/dL — ABNORMAL LOW (ref 8.9–10.3)
GFR calc Af Amer: >60 mL/min (ref >60)
GFR calc non Af Amer: >60 mL/min (ref >60)
GLUCOSE: 110 mg/dL — AB (ref 65–99)
Potassium: 3.6 mmol/L (ref 3.5–5.1)
SODIUM: 135 mmol/L (ref 135–145)

## 2015-06-16 MED ORDER — TRAMADOL HCL 50 MG PO TABS: 50.0000 mg | ORAL_TABLET | Freq: Four times a day (QID) | ORAL | Status: DC | PRN

## 2015-06-16 MED ORDER — MELOXICAM 15 MG PO TABS: 15.0000 mg | ORAL_TABLET | Freq: Every day | ORAL | Status: AC

## 2015-06-16 MED ORDER — ONDANSETRON HCL 4 MG PO TABS: 4.0000 mg | ORAL_TABLET | Freq: Three times a day (TID) | ORAL | Status: DC | PRN

## 2015-06-16 NOTE — Discharge Summary (Signed)
Name: Kristina Banks MRN: [id number] DOB: April 26, 1941 75 y.o. PCP: Derinda Late, MD  Date of Admission: 06/14/2015  4:30 AM Date of Discharge: 06/16/2015 Attending Physician: Annia Belt, MD  Discharge Diagnosis: 1.  Left L2-3 disc protrusion Principal Problem:   Abdominal pain Active Problems:   HTN (hypertension)   Back pain   Hip pain   LLQ pain   Radiculopathy, lumbar region  Discharge Medications:   Medication List    ASK your doctor about these medications        aspirin 81 MG tablet  Take 81 mg by mouth daily.     CALCIUM 600 + D PO  Take by mouth daily.     Fish Oil 1200 MG Caps  Take 1 capsule by mouth daily.     gabapentin 100 MG capsule  Commonly known as:  NEURONTIN  100 mg daily.     hydrochlorothiazide 25 MG tablet  Commonly known as:  HYDRODIURIL  Take 25 mg by mouth daily.     meloxicam 15 MG tablet  Commonly known as:  MOBIC  Take 15 mg by mouth daily.     neomycin-polymyxin-hydrocortisone otic solution  Commonly known as:  CORTISPORIN  Apply one to two drops to toe after soaking twice daily.     traMADol 50 MG tablet  Commonly known as:  ULTRAM  Take 1 tablet (50 mg total) by mouth every 6 (six) hours as needed for moderate pain.        Disposition and follow-up:   Ms.Kristina Banks was discharged from [facility] in Stable condition.  At the hospital follow up visit please address:  1.  Patient follow up with neurosurgery 2. Pain control  2.  Labs / imaging needed at time of follow-up: none  3.  Pending labs/ test needing follow-up: none  Follow-up Appointments: Follow-up Information    Schedule an appointment as soon as possible for a visit with BABAOFF, Caryl Bis, MD.   Specialty:  Family Medicine   Contact information:   [address] [facility] [place] [address] [phone]       Follow up with Floyce Stakes, MD On 06/21/2015.   Specialty:   Neurosurgery   Why:  Appointment time 2:45pm.  Arrive by 2:15pm   Contact information:   [address] [place] [address] [phone]       Discharge Instructions:   Consultations:    Procedures Performed:  [name]  06/15/2015  CLINICAL DATA:  Low back pain, LEFT lower quadrant pain beginning yesterday. Possible fecal incontinence. Follow-up lumbar disc protrusions. EXAM: MRI LUMBAR SPINE WITHOUT CONTRAST TECHNIQUE: Multiplanar, multisequence MR imaging of the lumbar spine was performed. No intravenous contrast was administered. COMPARISON:  CT abdomen and pelvis June 14, 2015 FINDINGS: Lumbar vertebral bodies intact and aligned with maintenance of lumbar lordosis. Using the reference level of the last well-formed intervertebral disc as L5-S1, moderate to severe L5-S1 disc height loss, mild L3-4 and L4-5 disc narrowing. Decreased T2 signal within lumbar disc compatible with mild desiccation. Multilevel mild chronic discogenic endplate changes. Mild superimposed acute discogenic endplate changes [id number]. 1 cm low T1 and low T2 lesion L4 corresponding to sclerotic lesion on recent CT. No STIR signal abnormality to suggest fracture. Conus medullaris terminates at L1-2 and appears normal in morphology and signal characteristics. Cauda equina is normal. Included prevertebral and paraspinal soft tissues are normal. Level  by level evaluation: T12-L1: 2 mm contiguous RIGHT central disc extrusion with approximately 3-4 mm contiguous subligamentous migration. Mild facet arthropathy and ligamentum flavum redundancy without canal stenosis or neural foraminal narrowing. L1 to: Small RIGHT central broad-based disc protrusion. Mild facet arthropathy and ligamentum flavum redundancy without canal stenosis or neural foraminal narrowing. L2-3: LEFT subarticular to extra foraminal disc extrusion measures at least 8 mm, appearing contiguous ; intermediate T2 signal suggest  acuity, the extrusion intra laterally displaces the exited LEFT L2 nerve, axial 15/36. Mild facet arthropathy and ligamentum flavum redundancy with trace LEFT facet effusion which is likely reactive. No canal stenosis. Extrusion results in severe LEFT neural foraminal narrowing. L3-4: Small broad-based disc bulge. Mild facet arthropathy and ligamentum flavum redundancy with trace LEFT facet effusion which is likely reactive. Mild canal stenosis. No neural foraminal narrowing. L4-5: Small broad-based disc bulge. Moderate facet arthropathy and ligamentum flavum redundancy with trace facet effusions which are likely reactive. No canal stenosis or neural foraminal narrowing. L5-S1: Small broad-based disc bulge asymmetric to the RIGHT, encroaching upon the exited RIGHT L5 nerve. Mild facet arthropathy and ligamentum flavum redundancy with trace facet effusions which are likely reactive. No canal stenosis. Mild to moderate RIGHT neural foraminal narrowing. IMPRESSION: At least 8 mm contiguous large LEFT L2-3 subarticular to extra foraminal disc extrusion displaces the exited LEFT L2 nerve and, results in severe LEFT L2-3 neural foraminal narrowing. Signal characteristics suggest acute extrusion. Small RIGHT central T12-L1 contiguous disc extrusion. No acute fracture or malalignment. Mild canal stenosis L3-4. L4 1 cm sclerotic lesion could represent a bone island though, if there is a history of cancer, consider bone scan. Electronically Signed   By: Elon Alas M.D.   On: 06/15/2015 22:55   Ct Renal Stone Study  06/15/2015  ADDENDUM REPORT: 06/15/2015 15:31 ADDENDUM: The original report was by Dr. Acquanetta Chain. The following addendum is by Dr. Van Clines: Dr. Beryle Beams and his team asked me to review this case in the context of left groin pain. On images 55 through 58 of series 5, there appears to be a left foraminal disc protrusion causing impingement on the left L2 nerve. I discussed this appearance  with Dr. Beryle Beams at 3:25 p.m. on 06/15/2015. Electronically Signed   By: Van Clines M.D.   On: 06/15/2015 15:31  06/15/2015  CLINICAL DATA:  Initial evaluation for acute left flank and groin pain. EXAM: CT ABDOMEN AND PELVIS WITHOUT CONTRAST TECHNIQUE: Multidetector CT imaging of the abdomen and pelvis was performed following the standard protocol without IV contrast. COMPARISON:  Prior CT from 05/14/2015. FINDINGS: Mild subsegmental atelectasis present within the visualized lung bases. Visualized lungs are otherwise clear. Aortic valvular calcifications partially visualized. No pleural or pericardial effusion. Limited noncontrast evaluation of the liver is unremarkable. Gallbladder within normal limits. No biliary dilatation. Spleen, adrenal glands, and pancreas demonstrate a normal unenhanced appearance. Kidneys are equal in size without evidence of nephrolithiasis or hydronephrosis. No radiopaque calculi seen along the course of either renal collecting system. There is no hydroureter. Subcentimeter hypodensity within the inferior left kidney too small the characterize, but statistically likely a small cyst. Small hiatal hernia noted. Stomach otherwise unremarkable. No evidence for bowel obstruction. Appendix is normal. Extensive sigmoid diverticulosis without evidence for acute diverticulitis. No acute inflammatory changes about the bowels. Bladder within normal limits. Uterus is absent. Ovaries not discretely identified. No free air or fluid. No pathologically enlarged intra-abdominal or pelvic lymph nodes. Prominent aorto bi-iliac atherosclerotic calcifications. No aneurysm. No acute osseous  abnormality. No worrisome lytic or blastic osseous lesions. Sclerotic lesion within the L4 vertebral body most consistent with a benign bone island. Degenerative disc desiccation with disc bulge present at L5-S1. Probable disc bulges at L3-4 and L4-5 as well. IMPRESSION: 1. No CT evidence for nephrolithiasis or  obstructive uropathy. No findings to explain patient's left flank/groin pain. 2. Sigmoid diverticulosis without acute diverticulitis. 3. Moderate aorto bi-iliac atheromatous disease.  No aneurysm. 4. Small hiatal hernia. Electronically Signed: By: Jeannine Boga M.D. On: 06/14/2015 06:01   Dg Hip Unilat With Pelvis 2-3 Views Left  06/15/2015  CLINICAL DATA:  75 year old female with lumbar back pain radiating to the left hip for 2 days. No known injury. Initial encounter. EXAM: DG HIP (WITH OR WITHOUT PELVIS) 2-3V LEFT COMPARISON:  CT Abdomen and Pelvis 06/14/2015. FINDINGS: Femoral heads are normally located and hip joint spaces are normal for age. Proximal left femur appears intact. However, there is an 8 cm area of sclerosis in the proximal left femoral shaft with AA broad zone of transition. Proximal to this the left femoral mineralization appears normal and symmetric to that on the right. Scattered small sclerotic foci in the iliac wings or more apparent by CT, along with a circumscribed small sclerotic area in the left L4 vertebral body. Pelvis intact. SI joints within normal limits. Bulky right eccentric L5-S1 endplate spurring. Negative visible bowel gas pattern. Small pelvic phleboliths. IMPRESSION: 1. No acute fracture or dislocation identified about the left hip or pelvis. 2. Questionable poorly defined sclerotic lesion of the proximal left femoral shaft (image 3). In conjunction with multiple small sclerotic foci seen on the CT Abdomen and Pelvis yesterday, recommend followup whole-body bone scan to evaluate further. Electronically Signed   By: Genevie Ann M.D.   On: 06/15/2015 11:16    2D Echo: none  Cardiac Cath: none  Clinical summary: 75 year old woman who presents with recurrent "abdominal" pain. On further questioning, the pain is primarily in the left sacroiliac region and radiates to the groin. About one year ago, following left shoulder surgery, she fell down hard on her left side  and re-injured the left shoulder. Daughter states that she also hit her left buttock hard and had a huge ecchymosis in that area. She had significant pain but this resolved over time. She developed recurrent pain referred to the left inguinal region in October. She was evaluated by general surgery on suspicion of a inguinal or femoral hernia. CT scan of the abdomen and pelvis done 05/14/2015 showed no significant abdominal or pelvic pathology. A probable small bone island left L4 vertebral body minimally increased in size over 8 year interval with no acute bone abnormalities noted. No inguinal or pelvic sidewall adenopathy. No inguinal or femoral hernia. Sigmoid diverticulosis noted. A CT renal stone study without contrast done through the emergency department yesterday January 30 also showed no gross pathology. A very definitive statement of "no acute osseous abnormalities. No worrisome lytic or blastic osseous lesions. Sclerotic lesion within L4 vertebral body most consistent with a benign bone island." Nothing to suggest active diverticulitis.  Hospital Course by problem list: Principal Problem:   Abdominal pain Active Problems:   HTN (hypertension)   Back pain   Hip pain   LLQ pain   Radiculopathy, lumbar region     Hospital course: Physical examination was remarkable for point tenderness in the sacroiliac region. We reviewed her CT scans with our radiologist in fact there was a disc protrusion left L2-3 with nerve compression at  that level which was the most likely etiology of her pain. Pain radiated along the lower lumbar nerve roots which explains the inguinal pain that she was having for the last 3 months. She had no focal neurologic deficits on exam. Motor strength 5 over 5 and reflexes intact. MRI was obtained which confirmed CT findings with a at least 51mm large left L2-3 disc extrusion with severe neural foraminal narrowing and compression of the left L2 nerve root. Arrangements were made  for outpatient neurosurgical consultation on February 6 with Dr. Joya Salm. No other acute issues were identified.  Discharge Vitals:   BP 147/76 mmHg  Pulse 85  Temp(Src) 98.4 F (36.9 C) (Oral)  Resp 20  Ht 5\' 5"  (1.651 m)  Wt 145 lb 8 oz (65.998 kg)  BMI 24.21 kg/m2  SpO2 96%  Discharge Labs:  Results for orders placed or performed during the hospital encounter of 06/14/15 (from the past 24 hour(s))  Basic metabolic panel     Status: Abnormal   Collection Time: 06/16/15  5:22 AM  Result Value Ref Range   Sodium 135 135 - 145 mmol/L   Potassium 3.6 3.5 - 5.1 mmol/L   Chloride 100 (L) 101 - 111 mmol/L   CO2 28 22 - 32 mmol/L   Glucose, Bld 110 (H) 65 - 99 mg/dL   BUN 13 6 - 20 mg/dL   Creatinine, Ser 0.74 0.44 - 1.00 mg/dL   Calcium 8.5 (L) 8.9 - 10.3 mg/dL   GFR calc non Af Amer >60 >60 mL/min   GFR calc Af Amer >60 >60 mL/min   Anion gap 7 5 - 15    Signed: Jule Ser, DO 06/16/2015, 11:28 AM    Services Ordered on Discharge: none Equipment Ordered on Discharge: none

## 2015-06-16 NOTE — Discharge Instructions (Signed)
For your appointment with Dr. Joya Salm, please arrive by 2:15 for your appointment at 2:45pm.  Your MRI results in part read: "At least 8 mm contiguous large LEFT L2-3 subarticular to extra foraminal disc extrusion displaces the exited LEFT L2 nerve and, results in severe LEFT L2-3 neural foraminal narrowing."  This is very likely to be causing your pain.

## 2015-06-16 NOTE — Progress Notes (Signed)
Discharge instructions and medications discussed with patient and daughter.  Prescriptions given to daughter.  All questions answered.

## 2015-06-16 NOTE — Progress Notes (Signed)
Subjective: Patient seen and examined this morning on rounds.  She is still complaining of back pain.  Discussed MRI results with her and need for follow up with neurosurgery.  Objective: Vital signs in last 24 hours: Filed Vitals:   06/15/15 1824 06/15/15 2012 06/16/15 0444 06/16/15 0950  BP: 155/87 139/66 143/71 147/76  Pulse: 73 69 83 85  Temp: 98.2 F (36.8 C) 98.2 F (36.8 C) 98.2 F (36.8 C) 98.4 F (36.9 C)  TempSrc: Oral Oral Oral Oral  Resp: 18 19 20 20   Height:      Weight:  145 lb 8 oz (65.998 kg)    SpO2: 97% 93% 94% 96%   Weight change: 6.4 oz (0.181 kg)  Intake/Output Summary (Last 24 hours) at 06/16/15 1114 Last data filed at 06/16/15 0900  Gross per 24 hour  Intake    960 ml  Output    600 ml  Net    360 ml   General: sitting up in chair, no distress HEENT: EOMI, no scleral icterus Pulm: no increased work of breathing Ext: warm and well perfused, no pedal edema Neuro: alert and oriented X3, no focal deficits  Lab Results: Basic Metabolic Panel:  Recent Labs Lab 06/14/15 0500 06/16/15 0522  NA 133* 135  K 3.8 3.6  CL 97* 100*  CO2 22 28  GLUCOSE 162* 110*  BUN 22* 13  CREATININE 0.70 0.74  CALCIUM 9.3 8.5*   Liver Function Tests: No results for input(s): AST, ALT, ALKPHOS, BILITOT, PROT, ALBUMIN in the last 168 hours.  Recent Labs Lab 06/14/15 0500  LIPASE 23   No results for input(s): AMMONIA in the last 168 hours. CBC:  Recent Labs Lab 06/14/15 0500 06/15/15 0645  WBC 11.0* 7.4  NEUTROABS 7.8*  --   HGB 15.4* 13.1  HCT 44.8 39.4  MCV 88.2 88.9  PLT 235 229   Cardiac Enzymes: No results for input(s): CKTOTAL, CKMB, CKMBINDEX, TROPONINI in the last 168 hours. BNP: No results for input(s): PROBNP in the last 168 hours. D-Dimer: No results for input(s): DDIMER in the last 168 hours. CBG: No results for input(s): GLUCAP in the last 168 hours. Hemoglobin A1C:  Recent Labs Lab 06/14/15 0500  HGBA1C 6.1*   Fasting  Lipid Panel: No results for input(s): CHOL, HDL, LDLCALC, TRIG, CHOLHDL, LDLDIRECT in the last 168 hours. Thyroid Function Tests: No results for input(s): TSH, T4TOTAL, FREET4, T3FREE, THYROIDAB in the last 168 hours. Coagulation: No results for input(s): LABPROT, INR in the last 168 hours. Anemia Panel: No results for input(s): VITAMINB12, FOLATE, FERRITIN, TIBC, IRON, RETICCTPCT in the last 168 hours. Urine Drug Screen: Drugs of Abuse  No results found for: LABOPIA, COCAINSCRNUR, LABBENZ, AMPHETMU, THCU, LABBARB  Alcohol Level: No results for input(s): ETH in the last 168 hours. Urinalysis:  Recent Labs Lab 06/14/15 0557  COLORURINE YELLOW  LABSPEC 1.023  PHURINE 6.5  GLUCOSEU NEGATIVE  HGBUR NEGATIVE  BILIRUBINUR NEGATIVE  KETONESUR NEGATIVE  PROTEINUR NEGATIVE  NITRITE NEGATIVE  LEUKOCYTESUR SMALL*    Micro Results: Recent Results (from the past 240 hour(s))  Urine culture     Status: None   Collection Time: 06/14/15  5:50 AM  Result Value Ref Range Status   Specimen Description URINE, RANDOM  Final   Special Requests NONE  Final   Culture MULTIPLE SPECIES PRESENT, SUGGEST RECOLLECTION  Final   Report Status 06/15/2015 FINAL  Final   Studies/Results: [name]  06/15/2015  CLINICAL DATA:  Low back pain, LEFT lower quadrant pain beginning yesterday. Possible fecal incontinence. Follow-up lumbar disc protrusions. EXAM: MRI LUMBAR SPINE WITHOUT CONTRAST TECHNIQUE: Multiplanar, multisequence MR imaging of the lumbar spine was performed. No intravenous contrast was administered. COMPARISON:  CT abdomen and pelvis June 14, 2015 FINDINGS: Lumbar vertebral bodies intact and aligned with maintenance of lumbar lordosis. Using the reference level of the last well-formed intervertebral disc as L5-S1, moderate to severe L5-S1 disc height loss, mild L3-4 and L4-5 disc narrowing. Decreased T2 signal within lumbar disc compatible with mild desiccation. Multilevel  mild chronic discogenic endplate changes. Mild superimposed acute discogenic endplate changes [id number]. 1 cm low T1 and low T2 lesion L4 corresponding to sclerotic lesion on recent CT. No STIR signal abnormality to suggest fracture. Conus medullaris terminates at L1-2 and appears normal in morphology and signal characteristics. Cauda equina is normal. Included prevertebral and paraspinal soft tissues are normal. Level by level evaluation: T12-L1: 2 mm contiguous RIGHT central disc extrusion with approximately 3-4 mm contiguous subligamentous migration. Mild facet arthropathy and ligamentum flavum redundancy without canal stenosis or neural foraminal narrowing. L1 to: Small RIGHT central broad-based disc protrusion. Mild facet arthropathy and ligamentum flavum redundancy without canal stenosis or neural foraminal narrowing. L2-3: LEFT subarticular to extra foraminal disc extrusion measures at least 8 mm, appearing contiguous ; intermediate T2 signal suggest acuity, the extrusion intra laterally displaces the exited LEFT L2 nerve, axial 15/36. Mild facet arthropathy and ligamentum flavum redundancy with trace LEFT facet effusion which is likely reactive. No canal stenosis. Extrusion results in severe LEFT neural foraminal narrowing. L3-4: Small broad-based disc bulge. Mild facet arthropathy and ligamentum flavum redundancy with trace LEFT facet effusion which is likely reactive. Mild canal stenosis. No neural foraminal narrowing. L4-5: Small broad-based disc bulge. Moderate facet arthropathy and ligamentum flavum redundancy with trace facet effusions which are likely reactive. No canal stenosis or neural foraminal narrowing. L5-S1: Small broad-based disc bulge asymmetric to the RIGHT, encroaching upon the exited RIGHT L5 nerve. Mild facet arthropathy and ligamentum flavum redundancy with trace facet effusions which are likely reactive. No canal stenosis. Mild to moderate RIGHT neural foraminal narrowing. IMPRESSION: At  least 8 mm contiguous large LEFT L2-3 subarticular to extra foraminal disc extrusion displaces the exited LEFT L2 nerve and, results in severe LEFT L2-3 neural foraminal narrowing. Signal characteristics suggest acute extrusion. Small RIGHT central T12-L1 contiguous disc extrusion. No acute fracture or malalignment. Mild canal stenosis L3-4. L4 1 cm sclerotic lesion could represent a bone island though, if there is a history of cancer, consider bone scan. Electronically Signed   By: Elon Alas M.D.   On: 06/15/2015 22:55   Dg Hip Unilat With Pelvis 2-3 Views Left  06/15/2015  CLINICAL DATA:  75 year old female with lumbar back pain radiating to the left hip for 2 days. No known injury. Initial encounter. EXAM: DG HIP (WITH OR WITHOUT PELVIS) 2-3V LEFT COMPARISON:  CT Abdomen and Pelvis 06/14/2015. FINDINGS: Femoral heads are normally located and hip joint spaces are normal for age. Proximal left femur appears intact. However, there is an 8 cm area of sclerosis in the proximal left femoral shaft with AA broad zone of transition. Proximal to this the left femoral mineralization appears normal and symmetric to that on the right. Scattered small sclerotic foci in the iliac wings or more apparent by CT, along with a circumscribed small sclerotic area in the left L4 vertebral body. Pelvis intact. SI joints within normal limits. Bulky right eccentric L5-S1 endplate spurring.  Negative visible bowel gas pattern. Small pelvic phleboliths. IMPRESSION: 1. No acute fracture or dislocation identified about the left hip or pelvis. 2. Questionable poorly defined sclerotic lesion of the proximal left femoral shaft (image 3). In conjunction with multiple small sclerotic foci seen on the CT Abdomen and Pelvis yesterday, recommend followup whole-body bone scan to evaluate further. Electronically Signed   By: Genevie Ann M.D.   On: 06/15/2015 11:16   Medications:  Scheduled Meds: . aspirin  81 mg Oral Daily  . enoxaparin  (LOVENOX) injection  40 mg Subcutaneous Q24H  . hydrochlorothiazide  25 mg Oral Daily  . meloxicam  15 mg Oral Daily   Continuous Infusions:  PRN Meds:.ondansetron **OR** ondansetron (ZOFRAN) IV, polyethylene glycol, traMADol Assessment/Plan: Principal Problem:   Abdominal pain Active Problems:   HTN (hypertension)   Back pain   Hip pain   LLQ pain   Radiculopathy, lumbar region  Left Hip and Low Back Pain: MRI results showing "at least 8 mm contiguous large LEFT L2-3 subarticular to extra foraminal disc extrusion displaces the exited LEFT L2 nerve and, results in severe LEFT L2-3 neural foraminal narrowing."  This is likely the cause of her pain.  Will discharge home today with planned follow up with neurosurgery - follow up 2/6 with Dr. Joya Salm at his office - follow up with PCP - continue Tramadol as needed for pain relief - continue Meloxicam daily for pain relief  HTN: Blood pressure normalizing now at 110/84 this morning - Home meds are HCTZ 25mg  daily, continue for now.  Dispo: Disposition is deferred at this time, awaiting improvement of current medical problems.    The patient does have a current PCP Derinda Late, MD) and does not need an [facility] hospital follow-up appointment after discharge.  The patient does not have transportation limitations that hinder transportation to clinic appointments.  .Services Needed at time of discharge: Y = Yes, Blank = No PT:    OT:   RN:   Equipment:   Other:       Jule Ser, DO 06/16/2015, 11:14 AM

## 2015-06-18 NOTE — Progress Notes (Signed)
Patient ID: Kristina Banks, female   DOB: 1940/05/24, 75 y.o.   MRN: [id number] Medicine attending discharge note: I attest to the accuracy of the discharge evaluation and plan as recorded in the daily progress note by resident physician Dr. Jule Ser.  Clinical summary: 75 year old woman who presents with recurrent "abdominal" pain. On further questioning, the pain is primarily in the left sacroiliac region and radiates to the groin. About one year ago, following left shoulder surgery, she fell down hard on her left side and re-injured the left shoulder. Daughter states that she also hit her left buttock hard and had a huge ecchymosis in that area. She had significant pain but this resolved over time. She developed recurrent pain referred to the left inguinal region in October. She was evaluated by general surgery on suspicion of a inguinal or femoral hernia. CT scan of the abdomen and pelvis done 05/14/2015 showed no significant abdominal or pelvic pathology. A probable small bone island left L4 vertebral body minimally increased in size over 8 year interval with no acute bone abnormalities noted. No inguinal or pelvic sidewall adenopathy. No inguinal or femoral hernia. Sigmoid diverticulosis noted. A CT renal stone study without contrast done through the emergency department yesterday January 30 also showed no gross pathology. A very definitive statement of "no acute osseous abnormalities. No worrisome lytic or blastic osseous lesions. Sclerotic lesion within L4 vertebral body most consistent with a benign bone island." Nothing to suggest active diverticulitis.  Hospital course: Physical examination was remarkable for point tenderness in the sacroiliac region. We reviewed her CT scans with our radiologist in fact there was a disc protrusion left L2-3 with nerve compression at that level which was the most likely etiology of her pain. Pain radiated along the lower lumbar nerve roots which explains the  inguinal pain that she was having for the last 3 months. She had no focal neurologic deficits on exam. Motor strength 5 over 5 and reflexes intact. MRI was obtained which confirmed CT findings with a at least 31mm large left L2-3 disc extrusion with severe neural foraminal narrowing and compression of the left L2 nerve root. Arrangements were made for outpatient neurosurgical consultation on February 6 with Dr. Joya Salm. No other acute issues were identified.  Disposition: Condition stable at time of discharge Follow-up with neurosurgery as noted. Ongoing follow-up with her primary care physician in [place]. There were no complications.

## 2015-06-22 ENCOUNTER — Emergency Department
Admission: EM | Admit: 2015-06-22 | Discharge: 2015-06-22 | Disposition: A | Discharge status: Home / Self Care | Payer: Medicare Other | Attending: Emergency Medicine | Admitting: Emergency Medicine

## 2015-06-22 ENCOUNTER — Emergency Department: Payer: Medicare Other

## 2015-06-22 ENCOUNTER — Encounter: Payer: Self-pay | Admitting: Emergency Medicine

## 2015-06-22 DIAGNOSIS — Z791 Long term (current) use of non-steroidal anti-inflammatories (NSAID): Secondary | ICD-10-CM | POA: Diagnosis not present

## 2015-06-22 DIAGNOSIS — I10 Essential (primary) hypertension: Secondary | ICD-10-CM | POA: Diagnosis not present

## 2015-06-22 DIAGNOSIS — Z79899 Other long term (current) drug therapy: Secondary | ICD-10-CM | POA: Insufficient documentation

## 2015-06-22 DIAGNOSIS — K644 Residual hemorrhoidal skin tags: Secondary | ICD-10-CM | POA: Insufficient documentation

## 2015-06-22 DIAGNOSIS — R339 Retention of urine, unspecified: Secondary | ICD-10-CM | POA: Diagnosis not present

## 2015-06-22 DIAGNOSIS — K5641 Fecal impaction: Secondary | ICD-10-CM | POA: Diagnosis not present

## 2015-06-22 DIAGNOSIS — Z7982 Long term (current) use of aspirin: Secondary | ICD-10-CM | POA: Insufficient documentation

## 2015-06-22 DIAGNOSIS — M549 Dorsalgia, unspecified: Secondary | ICD-10-CM

## 2015-06-22 DIAGNOSIS — K5900 Constipation, unspecified: Secondary | ICD-10-CM

## 2015-06-22 DIAGNOSIS — M5416 Radiculopathy, lumbar region: Secondary | ICD-10-CM | POA: Diagnosis not present

## 2015-06-22 LAB — BASIC METABOLIC PANEL
ANION GAP: 9 (ref 5–15)
BUN: 18 mg/dL (ref 6–20)
CALCIUM: 9 mg/dL (ref 8.9–10.3)
CO2: 28 mmol/L (ref 22–32)
Chloride: 95 mmol/L — ABNORMAL LOW (ref 101–111)
Creatinine, Ser: 0.72 mg/dL (ref 0.44–1.00)
Glucose, Bld: 139 mg/dL — ABNORMAL HIGH (ref 65–99)
POTASSIUM: 3.4 mmol/L — AB (ref 3.5–5.1)
SODIUM: 132 mmol/L — AB (ref 135–145)

## 2015-06-22 LAB — URINALYSIS COMPLETE WITH MICROSCOPIC (ARMC ONLY)
BACTERIA UA: NONE SEEN
Bilirubin Urine: NEGATIVE
Glucose, UA: NEGATIVE mg/dL
Hgb urine dipstick: NEGATIVE
KETONES UR: NEGATIVE mg/dL
Leukocytes, UA: NEGATIVE
Nitrite: NEGATIVE
PH: 6 (ref 5.0–8.0)
PROTEIN: NEGATIVE mg/dL
RBC / HPF: NONE SEEN RBC/hpf (ref 0–5)
Specific Gravity, Urine: 1.016 (ref 1.005–1.030)
WBC UA: NONE SEEN WBC/hpf (ref 0–5)

## 2015-06-22 LAB — CBC WITH DIFFERENTIAL/PLATELET
BASOS ABS: 0 10*3/uL (ref 0–0.1)
BASOS PCT: 0 %
Eosinophils Absolute: 0 10*3/uL (ref 0–0.7)
Eosinophils Relative: 0 %
HEMATOCRIT: 40.2 % (ref 35.0–47.0)
Hemoglobin: 13.6 g/dL (ref 12.0–16.0)
LYMPHS PCT: 4 %
Lymphs Abs: 0.8 10*3/uL — ABNORMAL LOW (ref 1.0–3.6)
MCH: 29.8 pg (ref 26.0–34.0)
MCHC: 33.8 g/dL (ref 32.0–36.0)
MCV: 88 fL (ref 80.0–100.0)
Monocytes Absolute: 1.1 10*3/uL — ABNORMAL HIGH (ref 0.2–0.9)
Monocytes Relative: 5 %
NEUTROS ABS: 18.3 10*3/uL — AB (ref 1.4–6.5)
NEUTROS PCT: 91 %
Platelets: 227 10*3/uL (ref 150–440)
RBC: 4.57 MIL/uL (ref 3.80–5.20)
RDW: 13.4 % (ref 11.5–14.5)
WBC: 20.2 10*3/uL — AB (ref 3.6–11.0)

## 2015-06-22 MED ORDER — SENNA 8.6 MG PO TABS: 2.0000 | ORAL_TABLET | Freq: Two times a day (BID) | ORAL | Status: DC

## 2015-06-22 MED ORDER — SODIUM CHLORIDE 0.9 % IV BOLUS (SEPSIS)
1000.0000 mL | Freq: Once | INTRAVENOUS | Status: AC
  Administered 2015-06-22: 1000 mL via INTRAVENOUS

## 2015-06-22 MED ORDER — DOCUSATE SODIUM 100 MG PO CAPS: 200.0000 mg | ORAL_CAPSULE | Freq: Two times a day (BID) | ORAL | Status: DC

## 2015-06-22 NOTE — ED Notes (Signed)
Visible stool protruding from rectal opening. Was able to remove some stool. Continues to be impacted. MD aware.

## 2015-06-22 NOTE — ED Notes (Signed)
MD Joni Fears  At bedside, disimpacted pt. Bladder scan showed 538mL urine in bladder

## 2015-06-22 NOTE — Discharge Instructions (Signed)
Constipation, Adult Constipation is when a person has fewer than three bowel movements a week, has difficulty having a bowel movement, or has stools that are dry, hard, or larger than normal. As people grow older, constipation is more common. A low-fiber diet, not taking in enough fluids, and taking certain medicines may make constipation worse.  CAUSES   Certain medicines, such as antidepressants, pain medicine, iron supplements, antacids, and water pills.   Certain diseases, such as diabetes, irritable bowel syndrome (IBS), thyroid disease, or depression.   Not drinking enough water.   Not eating enough fiber-rich foods.   Stress or travel.   Lack of physical activity or exercise.   Ignoring the urge to have a bowel movement.   Using laxatives too much.  SIGNS AND SYMPTOMS   Having fewer than three bowel movements a week.   Straining to have a bowel movement.   Having stools that are hard, dry, or larger than normal.   Feeling full or bloated.   Pain in the lower abdomen.   Not feeling relief after having a bowel movement.  DIAGNOSIS  Your health care provider will take a medical history and perform a physical exam. Further testing may be done for severe constipation. Some tests may include:  A barium enema X-ray to examine your rectum, colon, and, sometimes, your small intestine.   A sigmoidoscopy to examine your lower colon.   A colonoscopy to examine your entire colon. TREATMENT  Treatment will depend on the severity of your constipation and what is causing it. Some dietary treatments include drinking more fluids and eating more fiber-rich foods. Lifestyle treatments may include regular exercise. If these diet and lifestyle recommendations do not help, your health care provider may recommend taking over-the-counter laxative medicines to help you have bowel movements. Prescription medicines may be prescribed if over-the-counter medicines do not work.    HOME CARE INSTRUCTIONS   Eat foods that have a lot of fiber, such as fruits, vegetables, whole grains, and beans.  Limit foods high in fat and processed sugars, such as french fries, hamburgers, cookies, candies, and soda.   A fiber supplement may be added to your diet if you cannot get enough fiber from foods.   Drink enough fluids to keep your urine clear or pale yellow.   Exercise regularly or as directed by your health care provider.   Go to the restroom when you have the urge to go. Do not hold it.   Only take over-the-counter or prescription medicines as directed by your health care provider. Do not take other medicines for constipation without talking to your health care provider first.  [place] IF:   You have bright red blood in your stool.   Your constipation lasts for more than 4 days or gets worse.   You have abdominal or rectal pain.   You have thin, pencil-like stools.   You have unexplained weight loss. MAKE SURE YOU:   Understand these instructions.  Will watch your condition.  Will get help right away if you are not doing well or get worse.   This information is not intended to replace advice given to you by your health care provider. Make sure you discuss any questions you have with your health care provider.   Document Released: 01/28/2004 Document Revised: 05/22/2014 Document Reviewed: 02/10/2013 Elsevier Interactive Patient Education 2016 [employer].  Fecal Impaction A fecal impaction happens when there is a large, firm amount of stool (or feces) that cannot  be passed. The impacted stool is usually in the rectum, which is the lowest part of the large bowel. The impacted stool can block the colon and cause significant problems. CAUSES  The longer stool stays in the rectum, the harder it gets. Anything that slows down your bowel movements can lead to fecal impaction, such as:  Constipation. This can be a long-standing  (chronic) problem or can happen suddenly (acute).  Painful conditions of the rectum, such as hemorrhoids or anal fissures. The pain of these conditions can make you try to avoid having bowel movements.  Narcotic pain-relieving medicines, such as methadone, morphine, or codeine.  Not drinking enough fluids.  Inactivity and bed rest over long periods of time.  Diseases of the brain or nervous system that damage the nerves controlling the muscles of the intestines. SIGNS AND SYMPTOMS   Lack of normal bowel movements or changes in bowel patterns.  Sense of fullness in the rectum but unable to pass stool.  Pain or cramps in the abdominal area (often after meals).  Thin, watery discharge from the rectum. DIAGNOSIS  Your health care provider may suspect that you have a fecal impaction based on your symptoms and a physical exam. This will include an exam of your rectum. Sometimes X-rays or lab testing may be needed to confirm the diagnosis and to be sure there are no other problems.  TREATMENT   Initially an impaction can be removed manually. Using a gloved finger, your health care provider can remove hard stool from your rectum.  Medicine is sometimes needed. A suppository or enema can be given in the rectum to soften the stool, which can stimulate a bowel movement. Medicines can also be given by mouth (orally).  Though rare, surgery may be needed if the colon has torn (perforated) due to blockage. HOME CARE INSTRUCTIONS   Develop regular bowel habits. This could include getting in the habit of having a bowel movement after your morning cup of coffee or after eating. Be sure to allow yourself enough time on the toilet.  Maintain a high-fiber diet.  Drink enough fluids to keep your urine clear or pale yellow as directed by your health care provider.  Exercise regularly.  If you begin to get constipated, increase the amount of fiber in your diet. Eat plenty of fruits, vegetables, whole  wheat breads, bran, oatmeal, and similar products.  Take natural fiber laxatives or other laxatives only as directed by your health care provider. SEEK MEDICAL CARE IF:   You have ongoing rectal pain.  You require enemas or suppositories more than twice a week.  You have rectal bleeding.  You have continued problems, or you develop abdominal pain.  You have thin, pencil-like stools. SEEK IMMEDIATE MEDICAL CARE IF:  You have black or tarry stools. MAKE SURE YOU:   Understand these instructions.  Will watch your condition.  Will get help right away if you are not doing well or get worse.   This information is not intended to replace advice given to you by your health care provider. Make sure you discuss any questions you have with your health care provider.   Document Released: 01/22/2004 Document Revised: 02/19/2013 Document Reviewed: 11/05/2012 Elsevier Interactive Patient Education 2016 [employer]  Lumbosacral Radiculopathy Lumbosacral radiculopathy is a condition that involves the spinal nerves and nerve roots in the low back and bottom of the spine. The condition develops when these nerves and nerve roots move out of place or become inflamed and cause symptoms.  CAUSES This condition may be caused by:  Pressure from a disk that bulges out of place (herniated disk). A disk is a plate of cartilage that separates bones in the spine.  Disk degeneration.  A narrowing of the bones of the lower back (spinal stenosis).  A tumor.  An infection.  An injury that places sudden pressure on the disks that cushion the bones of your lower spine. RISK FACTORS This condition is more likely to develop in:  Males aged 30-50 years.  Females aged 30-60 years.  People who lift improperly.  People who are overweight or live a sedentary lifestyle.  People who smoke.  People who perform repetitive activities that strain the spine. SYMPTOMS Symptoms of this condition  include:  Pain that goes down from the back into the legs (sciatica). This is the most common symptom. The pain may be worse with sitting, coughing, or sneezing.  Pain and numbness in the arms and legs.  Muscle weakness.  Tingling.  Loss of bladder control or bowel control. DIAGNOSIS This condition is diagnosed with a physical exam and medical history. If the pain is lasting, you may have tests, such as:  MRI scan.  X-ray.  CT scan.  Myelogram.  Nerve conduction study. TREATMENT This condition is often treated with:  Hot packs and ice applied to affected areas.  Stretches to improve flexibility.  Exercises to strengthen back muscles.  Physical therapy.  Pain medicine.  A steroid injection in the spine. In some cases, no treatment is needed. If the condition is long-lasting (chronic), or if symptoms are severe, treatment may involve surgery or lifestyle changes, such as following a weight loss plan. HOME CARE INSTRUCTIONS Medicines  Take medicines only as directed by your health care provider.  Do not drive or operate heavy machinery while taking pain medicine. Injury Care  Apply a heat pack to the injured area as directed by your health care provider.  Apply ice to the affected area:  Put ice in a plastic bag.  Place a towel between your skin and the bag.  Leave the ice on for 20-30 minutes, every 2 hours while you are awake or as needed. Or, leave the ice on for as long as directed by your health care provider. Other Instructions  If you were shown how to do any exercises or stretches, do them as directed by your health care provider.  If your health care provider prescribed a diet or exercise program, follow it as directed.  Keep all follow-up visits as directed by your health care provider. This is important. SEEK MEDICAL CARE IF:  Your pain does not improve over time even when taking pain medicines. SEEK IMMEDIATE MEDICAL CARE IF:  Your develop  severe pain.  Your pain suddenly gets worse.  You develop increasing weakness in your legs.  You lose the ability to control your bladder or bowel.  You have difficulty walking or balancing.  You have a fever.   This information is not intended to replace advice given to you by your health care provider. Make sure you discuss any questions you have with your health care provider.   Document Released: 05/01/2005 Document Revised: 09/15/2014 Document Reviewed: 04/27/2014 Elsevier Interactive Patient Education [employer].

## 2015-06-22 NOTE — ED Notes (Signed)
Pt is in MRI, RN is at the bedside.

## 2015-06-22 NOTE — ED Provider Notes (Signed)
[facility] Emergency Department Provider Note  ____________________________________________  Time seen: 4:20 PM  I have reviewed the triage vital signs and the nursing notes.   HISTORY  Chief Complaint Constipation    HPI Kristina Banks is a 75 y.o. female who complains of severe rectal pain and constipation. Her last bowel movement was a week ago. 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 She is following up with Dr. Joya Salm of [facility] neurosurgery.She is planned for whenever the patient is willing to schedule it. She does report that she has some left leg weakness and numbness of the left inner thigh that has not changed. She does however complain that today she's been having a hard time urinating and last urinated 2 or 3 hours ago. No fever or chills. No worsening of back pain. No new falls or injuries.     Past Medical History  Diagnosis Date  . HBP (high blood pressure)   . Muscle pain   . Change in hair   . Discolored nails     TOENAILS  . Stroke [facility]) 2006    tia  . Arthritis   . Shortness of breath dyspnea     with exertion     Patient Active Problem List   Diagnosis Date Noted  . Back pain   . Hip pain   . LLQ pain   . Radiculopathy, lumbar region   . Abdominal pain 06/14/2015  . HTN (hypertension) 06/14/2015  . 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 Post-op pain 02/18/2013  . Contusion of foot 02/18/2013     Past Surgical History  Procedure Laterality Date  . Arm surgery Left     BONE TUMOR  . Foot surgery Right     10.3.14  . Bone tumor removed      Lt arm  . Abdominal hysterectomy    . Fractured knee Left   . Arthoscopic rotaor cuff repair Left 12/16/2014    Procedure: ARTHROSCOPIC ROTATOR CUFF REPAIR;  Surgeon: Earnestine Leys, MD;  Location: [facility] ORS;  Service: Orthopedics;  Laterality: Left;      Current Outpatient Rx  Name  Route  Sig  Dispense  Refill  . aspirin 81 MG tablet   Oral   Take 81 mg by mouth daily.         . Calcium Carbonate-Vitamin D (CALCIUM 600 + D PO)   Oral   Take by mouth daily.         Marland Kitchen docusate sodium (COLACE) 100 MG capsule   Oral   Take 2 capsules (200 mg total) by mouth 2 (two) times daily.   120 capsule   0   . gabapentin (NEURONTIN) 100 MG capsule      100 mg daily.          . hydrochlorothiazide (HYDRODIURIL) 25 MG tablet   Oral   Take 25 mg by mouth daily.         . meloxicam (MOBIC) 15 MG tablet   Oral   Take 1 tablet (15 mg total) by mouth daily.   30 tablet   0   . Omega-3 Fatty Acids (FISH OIL) 1200 MG CAPS   Oral   Take 1 capsule by mouth daily.         . ondansetron (ZOFRAN) 4 MG tablet   Oral   Take 1 tablet (4  mg total) by mouth every 8 (eight) hours as needed for nausea or vomiting.   30 tablet   0   . senna (SENOKOT) 8.6 MG TABS tablet   Oral   Take 2 tablets (17.2 mg total) by mouth 2 (two) times daily.   120 each   0   . traMADol (ULTRAM) 50 MG tablet   Oral   Take 1 tablet (50 mg total) by mouth every 6 (six) hours as needed for moderate pain or severe pain.   30 tablet   0      Allergies Codeine and Oxycodone   History reviewed. No pertinent family history.  Social History Social History  Substance Use Topics  . Smoking status: Never Smoker   . Smokeless tobacco: Never Used  . Alcohol Use: No    Review of Systems  Constitutional:   No fever or chills. No weight changes Eyes:   No blurry vision or double vision.  ENT:   No sore throat. Cardiovascular:   No chest pain. Respiratory:   No dyspnea or cough. Gastrointestinal:   Negative for abdominal pain, vomiting and diarrhea.  No BRBPR or melena. Genitourinary:   Positive difficulty urinating. No dysuria or urgency. Positive constipation Musculoskeletal:   Negative for back pain. No joint swelling or pain. Skin:    Negative for rash. Neurological:   Negative for headaches, positive weakness of the left leg Psychiatric:  No anxiety or depression.   Endocrine:  No hot/cold intolerance, changes in energy, or sleep difficulty.  10-point ROS otherwise negative.  ____________________________________________   PHYSICAL EXAM:  VITAL SIGNS: ED Triage Vitals  Enc Vitals Group     BP 06/22/15 1445 140/86 mmHg     Pulse Rate 06/22/15 1445 68     Resp 06/22/15 1445 20     Temp 06/22/15 1445 98.2 F (36.8 C)     Temp Source 06/22/15 1445 Oral     SpO2 06/22/15 1445 99 %     Weight 06/22/15 1445 141 lb (63.957 kg)     Height 06/22/15 1445 5\' 5"  (1.651 m)     Head Cir --      Peak Flow --      Pain Score 06/22/15 1446 10     Pain Loc --      Pain Edu? --      Excl. in [employer]? --     Vital signs reviewed, nursing assessments reviewed.   Constitutional:   Alert and oriented. Well appearing and in no distress. Eyes:   No scleral icterus. No conjunctival pallor. PERRL. EOMI ENT   Head:   Normocephalic and atraumatic.   Nose:   No congestion/rhinnorhea. No septal hematoma   Mouth/Throat:   MMM, no pharyngeal erythema. No peritonsillar mass. No uvula shift.   Neck:   No stridor. No SubQ emphysema. No meningismus. Hematological/Lymphatic/Immunilogical:   No cervical lymphadenopathy. Cardiovascular:   RRR. Normal and symmetric distal pulses are present in all extremities. No murmurs, rubs, or gallops. Respiratory:   Normal respiratory effort without tachypnea nor retractions. Breath sounds are clear and equal bilaterally. No wheezes/rales/rhonchi. Gastrointestinal:   Soft and nontender. No distention. There is no CVA tenderness.  No rebound, rigidity, or guarding. Rectal exam reveals a large amount of hard stool in the rectal vault. No blood. There is small external hemorrhoids. Genitourinary:   deferred Musculoskeletal:   Nontender with normal range of motion in all extremities. No joint  effusions.  No lower extremity tenderness.  No edema.  Neurologic:   Normal speech and language.  CN 2-10 normal. Motor grossly intact. Normal dorsiflexion and plantar flexion bilaterally Patient reports diminished sensation on the left inner thigh with stimulus No pronator drift.  Normal gait. Reflexes intact No gross focal neurologic deficits are appreciated.  Skin:    Skin is warm, dry and intact. No rash noted.  No petechiae, purpura, or bullae. Psychiatric:   Mood and affect are normal. Speech and behavior are normal. Patient exhibits appropriate insight and judgment.  ____________________________________________    LABS (pertinent positives/negatives) (all labs ordered are listed, but only abnormal results are displayed) Labs Reviewed  URINALYSIS COMPLETEWITH MICROSCOPIC ([facility] ONLY) - Abnormal; Notable for the following:    Color, Urine YELLOW (*)    APPearance CLEAR (*)    Squamous Epithelial / LPF 0-5 (*)    All other components within normal limits  BASIC METABOLIC PANEL - Abnormal; Notable for the following:    Sodium 132 (*)    Potassium 3.4 (*)    Chloride 95 (*)    Glucose, Bld 139 (*)    All other components within normal limits  CBC WITH DIFFERENTIAL/PLATELET - Abnormal; Notable for the following:    WBC 20.2 (*)    Neutro Abs 18.3 (*)    Lymphs Abs 0.8 (*)    Monocytes Absolute 1.1 (*)    All other components within normal limits   ____________________________________________   EKG    ____________________________________________    RADIOLOGY  X-ray abdomen shows no evidence of obstruction. There is an large stool ball on x-ray MRI lumbar spine reveals severe left neural foraminal stenosis at L2-L3, unchanged from previous  ____________________________________________   PROCEDURES ------------------------------------------------------------------------------------------------------------------- Fecal Disimpaction Procedure Note:  Performed by  me:  Patient placed in the lateral recumbent position with knees drawn towards chest. Nurse present for patient support. Large amount of hard brown stool removed with digital manipulation. No complications during procedure.   ------------------------------------------------------------------------------------------------------------------    ____________________________________________   INITIAL IMPRESSION / ASSESSMENT AND PLAN / ED COURSE  Pertinent labs & imaging results that were available during my care of the patient were reviewed by me and considered in my medical decision making (see chart for details).  Patient presents with fecal impaction, most likely related to opioid use without being on a GI regimen. However, with known low back pathology and a report of difficulty urinating, bladder scan was performed which showed a bladder volume of 500 mL's. At this point, around 5:05 PM, I discussed the case with the patient's neurosurgeon, Dr. Joya Salm who agreed that the potential urinary retention is not explained by the patient's disc extrusion which is outside of the canal and at the level of L2-L3. I therefore repeated the MRI of the lumbar spine which showed no changes. At this point, it does not appear that the patient is having a cauda equina syndrome and not at risk for further neurologic injury or deterioration. We'll discharge her home to follow up closely with Dr. Joya Salm for the planned surgery. After disimpacting the patient, I expect that she'll have greater success moving her bowels. We'll also continue her on GI regimen with senna and Colace and MiraLAX. Her labs to show a elevated white blood cell count at 20,000, but there is no evidence of infectious process including in the urine or spine. Low suspicion for any intra-abdominal process or sepsis. Most likely this is the margination related to the severe stress of her pain and she was quite  uncomfortable.  ____________________________________________   FINAL CLINICAL IMPRESSION(S) / ED DIAGNOSES  Final diagnoses:  Urinary retention  Back pain  Fecal impaction ([place])  Left lumbar radiculopathy      Carrie Mew, MD 06/22/15 2125

## 2015-06-22 NOTE — ED Notes (Signed)
Pt assisted to bathroom

## 2015-06-22 NOTE — ED Notes (Signed)
Pt to ed with c/o constipation x 1 week. States she has tried enema, and po laxatives.

## 2015-06-29 ENCOUNTER — Other Ambulatory Visit: Payer: Self-pay | Admitting: Neurosurgery

## 2015-06-30 NOTE — Pre-Procedure Instructions (Signed)
    SHRINIDHI BEALS  06/30/2015      [id number] [address] [place] [address] Phone: [phone] Fax: [phone]    Your procedure is scheduled on Wednesday, July 07, 2015.  Report to [facility] Admitting at 11:30 A.M.  Call this number if you have problems the morning of surgery:  [phone]   Remember:  Do not eat food or drink liquids after midnight.   Take these medicines the morning of surgery with A SIP OF WATER: Gabapentin (Neurontin), Ondansetron (Zofran), Tramadol (Ultram)   Stop taking: Meloxicam (Mobic), Aspirin, NSAIDS, Ibuprofen, Advil, Motrin, BC's, Goody's, Fish oil, all herbal medications, and all vitamins.     Do not wear jewelry, make-up or nail polish.  Do not wear lotions, powders, or perfumes.  You may NOT wear deodorant.  Do not shave 48 hours prior to surgery.    Do not bring valuables to the hospital.  [facility] is not responsible for any belongings or valuables.  Contacts, dentures or bridgework may not be worn into surgery.  Leave your suitcase in the car.  After surgery it may be brought to your room.  For patients admitted to the hospital, discharge time will be determined by your treatment team.  Patients discharged the day of surgery will not be allowed to drive home.   Special instructions:  See attached.   Please read over the following fact sheets that you were given. Pain Booklet, Coughing and Deep Breathing, MRSA Information and Surgical Site Infection Prevention

## 2015-07-01 ENCOUNTER — Encounter (HOSPITAL_COMMUNITY): Payer: Self-pay

## 2015-07-01 ENCOUNTER — Encounter (HOSPITAL_COMMUNITY)
Admission: RE | Admit: 2015-07-01 | Discharge: 2015-07-01 | Disposition: A | Discharge status: Home / Self Care | Payer: Medicare Other | Source: Ambulatory Visit | Attending: Neurosurgery | Admitting: Neurosurgery

## 2015-07-01 DIAGNOSIS — M5127 Other intervertebral disc displacement, lumbosacral region: Secondary | ICD-10-CM | POA: Insufficient documentation

## 2015-07-01 DIAGNOSIS — Z01812 Encounter for preprocedural laboratory examination: Secondary | ICD-10-CM | POA: Diagnosis not present

## 2015-07-01 HISTORY — DX: Nausea with vomiting, unspecified: R11.2

## 2015-07-01 HISTORY — DX: Other complications of anesthesia, initial encounter: T88.59XA

## 2015-07-01 HISTORY — DX: Adverse effect of unspecified anesthetic, initial encounter: T41.45XA

## 2015-07-01 HISTORY — DX: Other specified postprocedural states: Z98.890

## 2015-07-01 LAB — BASIC METABOLIC PANEL
Anion gap: 11 (ref 5–15)
BUN: 12 mg/dL (ref 6–20)
CALCIUM: 10.4 mg/dL — AB (ref 8.9–10.3)
CO2: 27 mmol/L (ref 22–32)
Chloride: 103 mmol/L (ref 101–111)
Creatinine, Ser: 0.78 mg/dL (ref 0.44–1.00)
GFR calc Af Amer: >60 mL/min (ref >60)
GLUCOSE: 97 mg/dL (ref 65–99)
POTASSIUM: 3.8 mmol/L (ref 3.5–5.1)
Sodium: 141 mmol/L (ref 135–145)

## 2015-07-01 LAB — CBC
HEMATOCRIT: 43.3 % (ref 36.0–46.0)
Hemoglobin: 14.1 g/dL (ref 12.0–15.0)
MCH: 29.6 pg (ref 26.0–34.0)
MCHC: 32.6 g/dL (ref 30.0–36.0)
MCV: 90.8 fL (ref 78.0–100.0)
PLATELETS: 286 10*3/uL (ref 150–400)
RBC: 4.77 MIL/uL (ref 3.87–5.11)
RDW: 13.2 % (ref 11.5–15.5)
WBC: 7.4 10*3/uL (ref 4.0–10.5)

## 2015-07-01 LAB — SURGICAL PCR SCREEN
MRSA, PCR: NEGATIVE
Staphylococcus aureus: NEGATIVE

## 2015-07-01 NOTE — Progress Notes (Signed)
PCP - Dr. Derinda Late Cardiologist - denies  EKG- 12/08/14 CXR - denies  Echo/stress test/cardiac cath - denies  Patient denies chest pain and shortness of breath at PAT appointment

## 2015-07-06 MED ORDER — CEFAZOLIN SODIUM-DEXTROSE 2-3 GM-% IV SOLR
2.0000 g | INTRAVENOUS | Status: AC
  Administered 2015-07-07: 2 g via INTRAVENOUS
  Filled 2015-07-06: qty 50

## 2015-07-06 NOTE — H&P (Signed)
Kristina Banks is an 75 y.o. female.   Chief Complaint: left leg pain HPI: patient seen with pain from lumbar to the left quadriceps, no better with conservative treatment.declined surgery and was seen at [facility] hospital with acute constipation. Mri showef lest l2-3 extraforaminal hnp  Past Medical History  Diagnosis Date  . HBP (high blood pressure)   . Muscle pain   . Change in hair   . Discolored nails     TOENAILS  . Stroke [facility]) 2006    tia  . Arthritis   . Shortness of breath dyspnea     with exertion  . Complication of anesthesia     "takes a little longer to wake up"   . PONV (postoperative nausea and vomiting)     nausea    Past Surgical History  Procedure Laterality Date  . Arm surgery Left     BONE TUMOR  . Foot surgery Right     10.3.14  . Bone tumor removed      Lt arm  . Abdominal hysterectomy    . Fractured knee Left   . Arthoscopic rotaor cuff repair Left 12/16/2014    Procedure: ARTHROSCOPIC ROTATOR CUFF REPAIR;  Surgeon: Earnestine Leys, MD;  Location: [facility] ORS;  Service: Orthopedics;  Laterality: Left;  . Colonoscopy      No family history on file. Social History:  reports that she has never smoked. She has never used smokeless tobacco. She reports that she does not drink alcohol or use illicit drugs.  Allergies:  Allergies  Allergen Reactions  . Codeine Nausea And Vomiting and Other (See Comments)    Hallucinations  . Cephalexin Other (See Comments)    Dizziness, PATIENT DENIES Pt denies  . Oxycodone Other (See Comments)    Dizzy    No prescriptions prior to admission    No results found for this or any previous visit (from the past 48 hour(s)). No results found.  Review of Systems  Constitutional: Negative.   HENT: Negative.   Eyes: Negative.   Respiratory: Negative.   Gastrointestinal: Positive for constipation.  Genitourinary: Negative.   Musculoskeletal: Positive for back pain.  Skin: Negative.   Neurological: Positive for  sensory change and focal weakness.  Endo/Heme/Allergies: Negative.   Psychiatric/Behavioral: Negative.     There were no vitals taken for this visit. Physical Exam  Hent, nl. Neck, nl. Cv, nl. Lungs, clear. Abdomen, soft. Extremities, nl. NEURO WEAKNESS OF LEFT QUADRICEPS,numbness of left l2-3 dermatomes, decrease of left quadriceps reflex. Mri shows large left 2-3 extraforaminal hnp, with some ddd above and below Assessment/Plan Patient for an extraforaminal discectomy at l2-3. She is aware of risks and benefits  Floyce Stakes, MD 07/06/2015, 5:30 PM

## 2015-07-07 ENCOUNTER — Inpatient Hospital Stay (HOSPITAL_COMMUNITY): Payer: Medicare Other

## 2015-07-07 ENCOUNTER — Encounter (HOSPITAL_COMMUNITY): Payer: Self-pay | Admitting: Anesthesiology

## 2015-07-07 ENCOUNTER — Encounter (HOSPITAL_COMMUNITY)
Admission: RE | Disposition: A | Discharge status: Home / Self Care | Payer: Self-pay | Source: Ambulatory Visit | Attending: Neurosurgery

## 2015-07-07 ENCOUNTER — Inpatient Hospital Stay (HOSPITAL_COMMUNITY)
Admission: RE | Admit: 2015-07-07 | Discharge: 2015-07-08 | DRG: 520 | Disposition: A | Discharge status: Home / Self Care | Payer: Medicare Other | Source: Ambulatory Visit | Attending: Neurosurgery | Admitting: Neurosurgery

## 2015-07-07 ENCOUNTER — Ambulatory Visit (HOSPITAL_COMMUNITY): Payer: Medicare Other | Admitting: Anesthesiology

## 2015-07-07 DIAGNOSIS — I10 Essential (primary) hypertension: Secondary | ICD-10-CM | POA: Diagnosis present

## 2015-07-07 DIAGNOSIS — Z7982 Long term (current) use of aspirin: Secondary | ICD-10-CM

## 2015-07-07 DIAGNOSIS — Z885 Allergy status to narcotic agent status: Secondary | ICD-10-CM

## 2015-07-07 DIAGNOSIS — M5116 Intervertebral disc disorders with radiculopathy, lumbar region: Principal | ICD-10-CM | POA: Diagnosis present

## 2015-07-07 DIAGNOSIS — M5126 Other intervertebral disc displacement, lumbar region: Secondary | ICD-10-CM | POA: Diagnosis present

## 2015-07-07 DIAGNOSIS — K5903 Drug induced constipation: Secondary | ICD-10-CM | POA: Diagnosis present

## 2015-07-07 DIAGNOSIS — Z79899 Other long term (current) drug therapy: Secondary | ICD-10-CM | POA: Diagnosis not present

## 2015-07-07 DIAGNOSIS — Z8673 Personal history of transient ischemic attack (TIA), and cerebral infarction without residual deficits: Secondary | ICD-10-CM | POA: Diagnosis not present

## 2015-07-07 DIAGNOSIS — M79605 Pain in left leg: Secondary | ICD-10-CM | POA: Diagnosis present

## 2015-07-07 DIAGNOSIS — Z881 Allergy status to other antibiotic agents status: Secondary | ICD-10-CM | POA: Diagnosis not present

## 2015-07-07 DIAGNOSIS — Z419 Encounter for procedure for purposes other than remedying health state, unspecified: Secondary | ICD-10-CM

## 2015-07-07 HISTORY — PX: LUMBAR LAMINECTOMY/DECOMPRESSION MICRODISCECTOMY: SHX5026

## 2015-07-07 SURGERY — LUMBAR LAMINECTOMY/DECOMPRESSION MICRODISCECTOMY 1 LEVEL
Anesthesia: General | Laterality: Left

## 2015-07-07 MED ORDER — PROPOFOL 10 MG/ML IV BOLUS
INTRAVENOUS | Status: AC
  Filled 2015-07-07: qty 20

## 2015-07-07 MED ORDER — DEXAMETHASONE SODIUM PHOSPHATE 4 MG/ML IJ SOLN
INTRAMUSCULAR | Status: DC | PRN
  Administered 2015-07-07: 4 mg via INTRAVENOUS

## 2015-07-07 MED ORDER — SUCCINYLCHOLINE CHLORIDE 20 MG/ML IJ SOLN
INTRAMUSCULAR | Status: AC
  Filled 2015-07-07: qty 1

## 2015-07-07 MED ORDER — DEXAMETHASONE SODIUM PHOSPHATE 4 MG/ML IJ SOLN
INTRAMUSCULAR | Status: AC
  Filled 2015-07-07: qty 1

## 2015-07-07 MED ORDER — METOCLOPRAMIDE HCL 5 MG/ML IJ SOLN: 10.0000 mg | Freq: Once | INTRAMUSCULAR | Status: DC | PRN

## 2015-07-07 MED ORDER — MENTHOL 3 MG MT LOZG: 1.0000 | LOZENGE | OROMUCOSAL | Status: DC | PRN

## 2015-07-07 MED ORDER — ZOLPIDEM TARTRATE 5 MG PO TABS: 5.0000 mg | ORAL_TABLET | Freq: Every evening | ORAL | Status: DC | PRN

## 2015-07-07 MED ORDER — PROPOFOL 10 MG/ML IV BOLUS
INTRAVENOUS | Status: DC | PRN
  Administered 2015-07-07: 100 mg via INTRAVENOUS

## 2015-07-07 MED ORDER — THROMBIN 5000 UNITS EX SOLR
OROMUCOSAL | Status: DC | PRN
  Administered 2015-07-07: 15:00:00 via TOPICAL

## 2015-07-07 MED ORDER — THROMBIN 5000 UNITS EX SOLR
CUTANEOUS | Status: DC | PRN
  Administered 2015-07-07 (×2): 5000 [IU] via TOPICAL

## 2015-07-07 MED ORDER — DIAZEPAM 5 MG PO TABS
5.0000 mg | ORAL_TABLET | Freq: Four times a day (QID) | ORAL | Status: DC | PRN
  Administered 2015-07-07: 5 mg via ORAL
  Filled 2015-07-07: qty 1

## 2015-07-07 MED ORDER — FENTANYL CITRATE (PF) 100 MCG/2ML IJ SOLN
INTRAMUSCULAR | Status: DC | PRN
  Administered 2015-07-07: 100 ug via INTRAVENOUS
  Administered 2015-07-07: 25 ug via INTRAVENOUS

## 2015-07-07 MED ORDER — PHENOL 1.4 % MT LIQD: 1.0000 | OROMUCOSAL | Status: DC | PRN

## 2015-07-07 MED ORDER — FENTANYL CITRATE (PF) 100 MCG/2ML IJ SOLN
INTRAMUSCULAR | Status: AC
  Filled 2015-07-07: qty 2

## 2015-07-07 MED ORDER — BUPIVACAINE-EPINEPHRINE (PF) 0.5% -1:200000 IJ SOLN
INTRAMUSCULAR | Status: DC | PRN
  Administered 2015-07-07: 10 mL via PERINEURAL

## 2015-07-07 MED ORDER — ONDANSETRON HCL 4 MG PO TABS
4.0000 mg | ORAL_TABLET | ORAL | Status: DC | PRN
  Administered 2015-07-08 (×2): 4 mg via ORAL
  Filled 2015-07-07 (×2): qty 1

## 2015-07-07 MED ORDER — PHENYLEPHRINE HCL 10 MG/ML IJ SOLN
10.0000 mg | INTRAVENOUS | Status: DC | PRN
  Administered 2015-07-07: 25 ug/min via INTRAVENOUS

## 2015-07-07 MED ORDER — ACETAMINOPHEN 325 MG PO TABS: 650.0000 mg | ORAL_TABLET | ORAL | Status: DC | PRN

## 2015-07-07 MED ORDER — PROPOFOL 500 MG/50ML IV EMUL
INTRAVENOUS | Status: AC
  Filled 2015-07-07: qty 50

## 2015-07-07 MED ORDER — LIDOCAINE HCL (CARDIAC) 20 MG/ML IV SOLN
INTRAVENOUS | Status: DC | PRN
  Administered 2015-07-07: 70 mg via INTRAVENOUS

## 2015-07-07 MED ORDER — SODIUM CHLORIDE 0.9% FLUSH: 3.0000 mL | Freq: Two times a day (BID) | INTRAVENOUS | Status: DC

## 2015-07-07 MED ORDER — ROCURONIUM BROMIDE 50 MG/5ML IV SOLN
INTRAVENOUS | Status: AC
  Filled 2015-07-07: qty 1

## 2015-07-07 MED ORDER — GABAPENTIN 100 MG PO CAPS: 100.0000 mg | ORAL_CAPSULE | Freq: Two times a day (BID) | ORAL | Status: DC

## 2015-07-07 MED ORDER — ASPIRIN 81 MG PO CHEW: 81.0000 mg | CHEWABLE_TABLET | Freq: Every day | ORAL | Status: DC

## 2015-07-07 MED ORDER — GABAPENTIN 100 MG PO CAPS
100.0000 mg | ORAL_CAPSULE | Freq: Every day | ORAL | Status: DC
  Administered 2015-07-07: 100 mg via ORAL
  Filled 2015-07-07: qty 1

## 2015-07-07 MED ORDER — HEMOSTATIC AGENTS (NO CHARGE) OPTIME
TOPICAL | Status: DC | PRN
  Administered 2015-07-07: 1 via TOPICAL

## 2015-07-07 MED ORDER — ONDANSETRON HCL 4 MG/2ML IJ SOLN
4.0000 mg | INTRAMUSCULAR | Status: DC | PRN
  Administered 2015-07-07: 4 mg via INTRAVENOUS
  Filled 2015-07-07: qty 2

## 2015-07-07 MED ORDER — LABETALOL HCL 5 MG/ML IV SOLN
INTRAVENOUS | Status: AC
  Filled 2015-07-07: qty 8

## 2015-07-07 MED ORDER — LIDOCAINE HCL (CARDIAC) 20 MG/ML IV SOLN
INTRAVENOUS | Status: AC
  Filled 2015-07-07: qty 5

## 2015-07-07 MED ORDER — SUGAMMADEX SODIUM 200 MG/2ML IV SOLN
INTRAVENOUS | Status: DC | PRN
  Administered 2015-07-07: 125 mg via INTRAVENOUS

## 2015-07-07 MED ORDER — FENTANYL CITRATE (PF) 250 MCG/5ML IJ SOLN
INTRAMUSCULAR | Status: AC
  Filled 2015-07-07: qty 5

## 2015-07-07 MED ORDER — SODIUM CHLORIDE 0.9 % IV SOLN: INTRAVENOUS | Status: DC

## 2015-07-07 MED ORDER — METHYLPREDNISOLONE ACETATE 80 MG/ML IJ SUSP
INTRAMUSCULAR | Status: DC | PRN
  Administered 2015-07-07: 80 mg

## 2015-07-07 MED ORDER — SENNOSIDES-DOCUSATE SODIUM 8.6-50 MG PO TABS: 1.0000 | ORAL_TABLET | Freq: Every evening | ORAL | Status: DC | PRN

## 2015-07-07 MED ORDER — 0.9 % SODIUM CHLORIDE (POUR BTL) OPTIME
TOPICAL | Status: DC | PRN
  Administered 2015-07-07: 1000 mL

## 2015-07-07 MED ORDER — SUGAMMADEX SODIUM 200 MG/2ML IV SOLN
INTRAVENOUS | Status: AC
  Filled 2015-07-07: qty 2

## 2015-07-07 MED ORDER — ONDANSETRON HCL 4 MG/2ML IJ SOLN
INTRAMUSCULAR | Status: AC
  Filled 2015-07-07: qty 2

## 2015-07-07 MED ORDER — HYDROCHLOROTHIAZIDE 25 MG PO TABS: 25.0000 mg | ORAL_TABLET | Freq: Every day | ORAL | Status: DC

## 2015-07-07 MED ORDER — FENTANYL CITRATE (PF) 100 MCG/2ML IJ SOLN
25.0000 ug | INTRAMUSCULAR | Status: DC | PRN
  Administered 2015-07-07: 50 ug via INTRAVENOUS
  Administered 2015-07-07: 25 ug via INTRAVENOUS

## 2015-07-07 MED ORDER — SODIUM CHLORIDE 0.9% FLUSH: 3.0000 mL | INTRAVENOUS | Status: DC | PRN

## 2015-07-07 MED ORDER — TRAMADOL HCL 50 MG PO TABS
50.0000 mg | ORAL_TABLET | ORAL | Status: DC | PRN
  Administered 2015-07-07 – 2015-07-08 (×3): 50 mg via ORAL
  Filled 2015-07-07 (×3): qty 1

## 2015-07-07 MED ORDER — FENTANYL CITRATE (PF) 100 MCG/2ML IJ SOLN: 25.0000 ug | INTRAMUSCULAR | Status: DC | PRN

## 2015-07-07 MED ORDER — LACTATED RINGERS IV SOLN
INTRAVENOUS | Status: DC
  Administered 2015-07-07 (×2): via INTRAVENOUS

## 2015-07-07 MED ORDER — GABAPENTIN 100 MG PO CAPS
200.0000 mg | ORAL_CAPSULE | Freq: Every day | ORAL | Status: DC
  Administered 2015-07-07: 200 mg via ORAL
  Filled 2015-07-07: qty 2

## 2015-07-07 MED ORDER — DOCUSATE SODIUM 100 MG PO CAPS
100.0000 mg | ORAL_CAPSULE | Freq: Two times a day (BID) | ORAL | Status: DC
  Administered 2015-07-07: 100 mg via ORAL
  Filled 2015-07-07: qty 1

## 2015-07-07 MED ORDER — MEPERIDINE HCL 25 MG/ML IJ SOLN: 6.2500 mg | INTRAMUSCULAR | Status: DC | PRN

## 2015-07-07 MED ORDER — ROCURONIUM BROMIDE 100 MG/10ML IV SOLN
INTRAVENOUS | Status: DC | PRN
  Administered 2015-07-07: 30 mg via INTRAVENOUS

## 2015-07-07 MED ORDER — VANCOMYCIN HCL IN DEXTROSE 1-5 GM/200ML-% IV SOLN
1000.0000 mg | Freq: Once | INTRAVENOUS | Status: AC
  Administered 2015-07-07: 1000 mg via INTRAVENOUS
  Filled 2015-07-07: qty 200

## 2015-07-07 MED ORDER — FENTANYL CITRATE (PF) 100 MCG/2ML IJ SOLN
INTRAMUSCULAR | Status: DC | PRN
  Administered 2015-07-07: 25 ug via INTRAVENOUS

## 2015-07-07 MED ORDER — ACETAMINOPHEN 650 MG RE SUPP: 650.0000 mg | RECTAL | Status: DC | PRN

## 2015-07-07 SURGICAL SUPPLY — 49 items
BENZOIN TINCTURE PRP APPL 2/3 (GAUZE/BANDAGES/DRESSINGS) ×3 IMPLANT
BLADE CLIPPER SURG (BLADE) IMPLANT
BUR ACORN 6.0 (BURR) ×2 IMPLANT
BUR ACORN 6.0MM (BURR) ×1
BUR MATCHSTICK NEURO 3.0 LAGG (BURR) IMPLANT
CANISTER SUCT 3000ML PPV (MISCELLANEOUS) ×3 IMPLANT
CLOSURE WOUND 1/2 X4 (GAUZE/BANDAGES/DRESSINGS) ×1
DRAPE LAPAROTOMY 100X72X124 (DRAPES) ×3 IMPLANT
DRAPE MICROSCOPE LEICA (MISCELLANEOUS) ×3 IMPLANT
DRAPE POUCH INSTRU U-SHP 10X18 (DRAPES) ×3 IMPLANT
DRSG OPSITE POSTOP 3X4 (GAUZE/BANDAGES/DRESSINGS) ×3 IMPLANT
DRSG PAD ABDOMINAL 8X10 ST (GAUZE/BANDAGES/DRESSINGS) IMPLANT
DURAPREP 26ML APPLICATOR (WOUND CARE) ×3 IMPLANT
ELECT REM PT RETURN 9FT ADLT (ELECTROSURGICAL) ×3
ELECTRODE REM PT RTRN 9FT ADLT (ELECTROSURGICAL) ×1 IMPLANT
GAUZE SPONGE 4X4 12PLY STRL (GAUZE/BANDAGES/DRESSINGS) ×3 IMPLANT
GAUZE SPONGE 4X4 16PLY XRAY LF (GAUZE/BANDAGES/DRESSINGS) IMPLANT
GLOVE BIOGEL M 8.0 STRL (GLOVE) ×3 IMPLANT
GLOVE EXAM NITRILE LRG STRL (GLOVE) IMPLANT
GLOVE EXAM NITRILE MD LF STRL (GLOVE) IMPLANT
GLOVE EXAM NITRILE XL STR (GLOVE) IMPLANT
GLOVE EXAM NITRILE XS STR PU (GLOVE) IMPLANT
GOWN STRL REUS W/ TWL LRG LVL3 (GOWN DISPOSABLE) ×1 IMPLANT
GOWN STRL REUS W/ TWL XL LVL3 (GOWN DISPOSABLE) IMPLANT
GOWN STRL REUS W/TWL 2XL LVL3 (GOWN DISPOSABLE) IMPLANT
GOWN STRL REUS W/TWL LRG LVL3 (GOWN DISPOSABLE) ×2
GOWN STRL REUS W/TWL XL LVL3 (GOWN DISPOSABLE)
KIT BASIN OR (CUSTOM PROCEDURE TRAY) ×3 IMPLANT
KIT ROOM TURNOVER OR (KITS) ×3 IMPLANT
NEEDLE HYPO 18GX1.5 BLUNT FILL (NEEDLE) IMPLANT
NEEDLE HYPO 21X1.5 SAFETY (NEEDLE) IMPLANT
NEEDLE HYPO 25X1 1.5 SAFETY (NEEDLE) IMPLANT
NEEDLE SPNL 20GX3.5 QUINCKE YW (NEEDLE) IMPLANT
NS IRRIG 1000ML POUR BTL (IV SOLUTION) ×3 IMPLANT
PACK LAMINECTOMY NEURO (CUSTOM PROCEDURE TRAY) ×3 IMPLANT
PAD ARMBOARD 7.5X6 YLW CONV (MISCELLANEOUS) ×9 IMPLANT
PATTIES SURGICAL .5 X1 (DISPOSABLE) ×3 IMPLANT
RUBBERBAND STERILE (MISCELLANEOUS) ×6 IMPLANT
SPONGE LAP 4X18 X RAY DECT (DISPOSABLE) IMPLANT
SPONGE SURGIFOAM ABS GEL SZ50 (HEMOSTASIS) ×3 IMPLANT
STRIP CLOSURE SKIN 1/2X4 (GAUZE/BANDAGES/DRESSINGS) ×2 IMPLANT
SUT VIC AB 0 CT1 18XCR BRD8 (SUTURE) ×1 IMPLANT
SUT VIC AB 0 CT1 8-18 (SUTURE) ×2
SUT VIC AB 2-0 CP2 18 (SUTURE) ×3 IMPLANT
SUT VIC AB 3-0 SH 8-18 (SUTURE) ×3 IMPLANT
SYR 5ML LL (SYRINGE) IMPLANT
TOWEL OR 17X24 6PK STRL BLUE (TOWEL DISPOSABLE) ×3 IMPLANT
TOWEL OR 17X26 10 PK STRL BLUE (TOWEL DISPOSABLE) ×3 IMPLANT
WATER STERILE IRR 1000ML POUR (IV SOLUTION) ×3 IMPLANT

## 2015-07-07 NOTE — Anesthesia Procedure Notes (Signed)
Procedure Name: Intubation Date/Time: 07/07/2015 2:45 PM Performed by: Trixie Deis A Pre-anesthesia Checklist: Patient identified, Timeout performed, Emergency Drugs available, Suction available and Patient being monitored Patient Re-evaluated:Patient Re-evaluated prior to inductionOxygen Delivery Method: Circle system utilized Preoxygenation: Pre-oxygenation with 100% oxygen Intubation Type: IV induction Ventilation: Mask ventilation without difficulty Laryngoscope Size: Mac and 3 Grade View: Grade I Tube type: Oral Tube size: 7.0 mm Number of attempts: 1 Airway Equipment and Method: Stylet Placement Confirmation: ETT inserted through vocal cords under direct vision,  breath sounds checked- equal and bilateral and positive ETCO2 Secured at: 21 cm Tube secured with: Tape Dental Injury: Teeth and Oropharynx as per pre-operative assessment

## 2015-07-07 NOTE — Op Note (Signed)
[name], Kristina Banks                ACCOUNT NO.:  [id number]  MEDICAL RECORD NO.:  [id number]  LOCATION:  3C02C                        FACILITY:  [facility]  PHYSICIAN:  Leeroy Cha, M.D.   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  PROCEDURE:  Left L2-L3 extraforaminal diskectomy.  Decompression of the L2 nerve root.  Microscope.  C-arm.  SURGEON:  Leeroy Cha, M.D.  CLINICAL HISTORY:  The patient was admitted because of back and left leg pain associated with weakness of the quadriceps.  MRI showed degenerative disk disease, but at the level of 2-3, she has a large herniated disk, extraforaminal.  The patient has failed conservative treatment.  Surgery was advised, and she and her daughter knew the risks and benefits.  DESCRIPTION OF PROCEDURE:  The patient was taken to the OR, and after intubation, she was positioned in a prone manner.  The back was cleaned with ChloraPrep.  With the C-arm, we selected the area between 2-3 about an inch away from the midline.  Incision was made through the skin, subcutaneous tissue, muscle all the way down to the transverse process. With the microscope, we removed the intertransverse ligament.  We found the L2 nerve root, which was swollen and reddish.  Lysis was accomplished and then immediately right at the takeoff of the nerve root, there were three large fragments, which were removed easily. Investigation of above and below was negative.  The area was irrigated. Valsalva maneuver was negative.  Fentanyl and Depo-Medrol were left in the operative site and the wound was closed with Vicryl and Steri- Strips.  The patient did well.           ______________________________ Leeroy Cha, M.D.     EB/MEDQ  D:  07/07/2015  T:  07/07/2015  Job:  [id number]

## 2015-07-07 NOTE — Anesthesia Preprocedure Evaluation (Addendum)
Anesthesia Evaluation  Patient identified by MRN, date of birth, ID band Patient awake    Reviewed: Allergy & Precautions, NPO status , Patient's Chart, lab work & pertinent test results  History of Anesthesia Complications (+) PONV and history of anesthetic complications  Airway Mallampati: II  TM Distance: >3 FB     Dental  (+) Caps   Pulmonary shortness of breath and with exertion,    Pulmonary exam normal breath sounds clear to auscultation       Cardiovascular hypertension, Pt. on medications Normal cardiovascular exam Rhythm:Regular Rate:Normal  Incomplete RBBB pattern   Neuro/Psych Lumbar Radiculopathy Left  Neuromuscular disease CVA, No Residual Symptoms negative psych ROS   GI/Hepatic Neg liver ROS, Hx/o severe constipation with narcotics   Endo/Other  negative endocrine ROS  Renal/GU negative Renal ROS  negative genitourinary   Musculoskeletal  (+) Arthritis , Osteoarthritis,  Displaced lumbar disc- L2-L3   Abdominal   Peds  Hematology negative hematology ROS (+)   Anesthesia Other Findings   Reproductive/Obstetrics negative OB ROS                          Anesthesia Physical Anesthesia Plan  ASA: II  Anesthesia Plan: General   Post-op Pain Management:    Induction: Intravenous  Airway Management Planned: Oral ETT  Additional Equipment:   Intra-op Plan:   Post-operative Plan: Extubation in OR  Informed Consent: I have reviewed the patients History and Physical, chart, labs and discussed the procedure including the risks, benefits and alternatives for the proposed anesthesia with the patient or authorized representative who has indicated his/her understanding and acceptance.   Dental advisory given  Plan Discussed with: CRNA, Anesthesiologist and Surgeon  Anesthesia Plan Comments:         Anesthesia Quick Evaluation

## 2015-07-07 NOTE — Transfer of Care (Signed)
Immediate Anesthesia Transfer of Care Note  Patient: Kristina Banks  Procedure(s) Performed: Procedure(s) with comments: Left Lumbar two-three Extraforaminal Microdiskectomy (Left) - Left L2-3 Extraforaminal Microdiskectomy  Patient Location: PACU  Anesthesia Type:General  Level of Consciousness: awake, alert  and oriented  Airway & Oxygen Therapy: Patient Spontanous Breathing and Patient connected to nasal cannula oxygen  Post-op Assessment: Report given to RN, Post -op Vital signs reviewed and stable and Patient moving all extremities  Post vital signs: Reviewed and stable  Last Vitals:  Filed Vitals:   07/07/15 1033  BP: 158/64  Pulse: 75  Temp: 36.2 C  Resp: 18    Complications: No apparent anesthesia complications

## 2015-07-07 NOTE — Anesthesia Postprocedure Evaluation (Signed)
Anesthesia Post Note  Patient: Kristina Banks  Procedure(s) Performed: Procedure(s) (LRB): Left Lumbar two-three Extraforaminal Microdiskectomy (Left)  Patient location during evaluation: PACU Anesthesia Type: General Level of consciousness: awake and alert Pain management: pain level controlled Vital Signs Assessment: post-procedure vital signs reviewed and stable Respiratory status: spontaneous breathing, nonlabored ventilation and respiratory function stable Cardiovascular status: blood pressure returned to baseline and stable Postop Assessment: no signs of nausea or vomiting Anesthetic complications: no    Last Vitals:  Filed Vitals:   07/07/15 1700 07/07/15 1710  BP: 158/69   Pulse: 73 73  Temp:  36.4 C  Resp: 11 19    Last Pain:  Filed Vitals:   07/07/15 1711  PainSc: Asleep                 [name] A.

## 2015-07-08 ENCOUNTER — Encounter (HOSPITAL_COMMUNITY): Payer: Self-pay | Admitting: Neurosurgery

## 2015-07-08 DIAGNOSIS — M79605 Pain in left leg: Secondary | ICD-10-CM | POA: Diagnosis not present

## 2015-07-08 DIAGNOSIS — M5116 Intervertebral disc disorders with radiculopathy, lumbar region: Secondary | ICD-10-CM | POA: Diagnosis not present

## 2015-07-08 NOTE — Progress Notes (Signed)
Pt and daughter given D/C instructions with verbal understanding. Pt's daughter will pick Rx's up at Dr. Harley Hallmark office after D/C. Pt's incision is clean and dry with no sign of infection. Pt's IV was removed prior to D/C. Pt received 3-n-1 prior to D/C. Pt D/C'd home via wheelchair @ 1530 per MD order. Pt is stable @ D/C and has no other needs at this time. Holli Humbles, RN

## 2015-07-08 NOTE — Discharge Summary (Signed)
Physician Discharge Summary  Patient ID: Kristina Banks MRN: [id number] DOB/AGE: 76-Jul-1942 76 y.o.  Admit date: 07/07/2015 Discharge date: 07/08/2015  Admission Diagnoses:lumbar herniated disc  Discharge Diagnoses:  Active Problems:   Lumbar herniated disc   Discharged Condition: no pain  Hospital Course: surgery  Consults: none  Significant Diagnostic Studies: mri  Treatments: surgery. l23 discectomy  Discharge Exam: Blood pressure 123/66, pulse 66, temperature 97.7 F (36.5 C), temperature source Oral, resp. rate 18, height 5\' 5"  (1.651 m), weight 61.553 kg (135 lb 11.2 oz), SpO2 95 %. No pain  Disposition: home     Medication List    ASK your doctor about these medications        aspirin 81 MG tablet  Take 81 mg by mouth daily.     CALCIUM 600 + D PO  Take by mouth daily.     docusate sodium 100 MG capsule  Commonly known as:  COLACE  Take 2 capsules (200 mg total) by mouth 2 (two) times daily.     Fish Oil 1200 MG Caps  Take 1 capsule by mouth daily.     gabapentin 100 MG capsule  Commonly known as:  NEURONTIN  Take 100-200 mg by mouth 2 (two) times daily. Take 1 capsule in the morning  Take 2 capsules in the evening     hydrochlorothiazide 25 MG tablet  Commonly known as:  HYDRODIURIL  Take 25 mg by mouth daily.     meloxicam 15 MG tablet  Commonly known as:  MOBIC  Take 1 tablet (15 mg total) by mouth daily.     ondansetron 4 MG tablet  Commonly known as:  ZOFRAN  Take 1 tablet (4 mg total) by mouth every 8 (eight) hours as needed for nausea or vomiting.     senna 8.6 MG Tabs tablet  Commonly known as:  SENOKOT  Take 2 tablets (17.2 mg total) by mouth 2 (two) times daily.     traMADol 50 MG tablet  Commonly known as:  ULTRAM  Take 1 tablet (50 mg total) by mouth every 6 (six) hours as needed for moderate pain or severe pain.         Signed: Floyce Stakes 07/08/2015, 2:11 PM

## 2015-07-08 NOTE — Progress Notes (Signed)
Occupational Therapy Evaluation Patient Details Name: Kristina Banks MRN: [id number] DOB: [birth year] Today's Date: 07/08/2015    History of Present Illness Pt is a 75 y/o female who presents s/p L2-L3 laminectomy/decompression.   Clinical Impression   Pt admitted with the above diagnoses and presents with below problem list. Pt will benefit from continued acute OT to address the below listed deficits and maximize independence with BADLs prior to d/c home. PTA pt was independent with ADLs. Pt is currently supervision to min guard with ADLs. OT to continue to follow acutely.      Follow Up Recommendations  No OT follow up;Supervision - Intermittent;Other (comment) (OOB/mobility)    Equipment Recommendations  3 in 1 bedside comode;Tub/shower seat    Recommendations for Other Services       Precautions / Restrictions Precautions Precautions: Back;Fall Precaution Booklet Issued: Yes (comment) Precaution Comments: handout in room, reviewed contents Restrictions Weight Bearing Restrictions: No      Mobility Bed Mobility Overal bed mobility: Needs Assistance Bed Mobility: Rolling;Sidelying to Sit Rolling: Supervision Sidelying to sit: Min guard       General bed mobility comments: in recliner, verbally reviewed logrolling technique  Transfers Overall transfer level: Needs assistance Equipment used: None Transfers: Sit to/from Stand Sit to Stand: Supervision;Min guard         General transfer comment: from recliner    Balance Overall balance assessment: Needs assistance Sitting-balance support: Feet supported Sitting balance-Leahy Scale: Fair     Standing balance support: No upper extremity supported Standing balance-Leahy Scale: Fair Standing balance comment: stood at sink to brush teeth, no LOB                            ADL Overall ADL's : Needs assistance/impaired Eating/Feeding: Set up;Sitting   Grooming: Wash/dry face;Min  guard;Standing Grooming Details (indicate cue type and reason): stood at sink to brush teeth, cues for compensatory strategies to maintain precautions Upper Body Bathing: Set up;Sitting   Lower Body Bathing: Min guard;Sit to/from stand   Upper Body Dressing : Set up;Sitting   Lower Body Dressing: Min guard;Sit to/from stand   Toilet Transfer: Supervision/safety;Ambulation (3n1 over toilet)   Toileting- Clothing Manipulation and Hygiene: Sit to/from stand;Min guard   Tub/ Shower Transfer: Min guard;Walk-in shower;Ambulation;3 in 1   Functional mobility during ADLs: Supervision/safety General ADL Comments: Discussed ADL education to maintain back precautions. Daughter present. Discussed home set up and fall prevention. Pt completed in-room functional mobility to/from bathroom and stood to brush teeth.     Vision     Perception     Praxis      Pertinent Vitals/Pain Pain Assessment: 0-10 Pain Score: 5  Pain Location: back Pain Descriptors / Indicators: Aching;Sore Pain Intervention(s): Limited activity within patient's tolerance;Monitored during session;Repositioned     Hand Dominance     Extremity/Trunk Assessment Upper Extremity Assessment Upper Extremity Assessment: Overall WFL for tasks assessed   Lower Extremity Assessment Lower Extremity Assessment: Defer to PT evaluation   Cervical / Trunk Assessment Cervical / Trunk Assessment: Kyphotic   Communication Communication Communication: No difficulties   Cognition Arousal/Alertness: Awake/alert Behavior During Therapy: WFL for tasks assessed/performed Overall Cognitive Status: Within Functional Limits for tasks assessed                     General Comments       Exercises       Shoulder Instructions      Home  Living Family/patient expects to be discharged to:: Private residence Living Arrangements: Alone Available Help at Discharge: Family;Available 24 hours/day;Other (Comment) (daughter) Type  of Home: House Home Access: Stairs to enter Technical brewer of Steps: 5 Entrance Stairs-Rails: Right;Left Home Layout: Two level Alternate Level Stairs-Number of Steps: Flight Alternate Level Stairs-Rails: Right;Left Bathroom Shower/Tub: Occupational psychologist: Standard Bathroom Accessibility: Yes   Home Equipment: Hand held shower head   Additional Comments: spouse passed away recently      Prior Functioning/Environment Level of Independence: Independent             OT Diagnosis: Acute pain   OT Problem List: Impaired balance (sitting and/or standing);Decreased knowledge of use of DME or AE;Decreased knowledge of precautions;Pain   OT Treatment/Interventions: Self-care/ADL training;DME and/or AE instruction;Therapeutic activities;Patient/family education;Balance training    OT Goals(Current goals can be found in the care plan section) Acute Rehab OT Goals Patient Stated Goal: Return home OT Goal Formulation: With patient/family Time For Goal Achievement: 07/15/15 Potential to Achieve Goals: Good ADL Goals Pt Will Perform Grooming: with modified independence;standing Pt Will Perform Lower Body Bathing: with modified independence;sit to/from stand Pt Will Perform Lower Body Dressing: with modified independence;sit to/from stand Pt Will Transfer to Toilet: with modified independence;ambulating;bedside commode Pt Will Perform Tub/Shower Transfer: Shower transfer;ambulating;3 in 1  OT Frequency: Min 1X/week   Barriers to D/C:            Co-evaluation              End of Session Equipment Utilized During Treatment: Gait belt  Activity Tolerance: Patient tolerated treatment well Patient left: in chair;with call bell/phone within reach;with family/visitor present   Time: 0902-0924 OT Time Calculation (min): 22 min Charges:  OT General Charges $OT Visit: 1 Procedure OT Evaluation $OT Eval Low Complexity: 1 Procedure G-Codes:    Hortencia Pilar 07-11-2015, 10:13 AM

## 2015-07-08 NOTE — Progress Notes (Signed)
Patient ID: Kristina Banks, female   DOB: 1940-06-23, 75 y.o.   MRN: [id number] Stable, no weakness. Dressing dry. She and daughter to decide when to go home. Walking with pt

## 2015-07-08 NOTE — Evaluation (Signed)
Physical Therapy Evaluation Patient Details Name: Kristina Banks MRN: [id number] DOB: 01-04-1941 Today's Date: 07/08/2015   History of Present Illness  Pt is a 75 y/o female who presents s/p L2-L3 laminectomy/decompression.   Clinical Impression  Pt admitted with above diagnosis. Pt currently with functional limitations due to the deficits listed below (see PT Problem List). At the time of PT eval pt was able to perform transfers and ambulation with min guard to supervision for safety. Daughter will be available for assist/supervision at d/c. Pt will benefit from skilled PT to increase their independence and safety with mobility to allow discharge to the venue listed below.       Follow Up Recommendations Outpatient PT;Supervision for mobility/OOB    Equipment Recommendations  3in1 (PT)    Recommendations for Other Services       Precautions / Restrictions Precautions Precautions: Back;Fall Precaution Booklet Issued: Yes (comment) Precaution Comments: Reviewed handout, and pt was cued for precautions during functional mobility.  Restrictions Weight Bearing Restrictions: No      Mobility  Bed Mobility Overal bed mobility: Needs Assistance Bed Mobility: Rolling;Sidelying to Sit Rolling: Supervision Sidelying to sit: Min guard       General bed mobility comments: VC's for sequencing and technique. Pt was able to complete trunk elevation with min guard for support.   Transfers Overall transfer level: Needs assistance Equipment used: None Transfers: Sit to/from Stand Sit to Stand: Supervision         General transfer comment: No assist required.  Ambulation/Gait Ambulation/Gait assistance: Supervision Ambulation Distance (Feet): 400 Feet Assistive device: None Gait Pattern/deviations: Step-through pattern;Decreased stride length Gait velocity: Decreased Gait velocity interpretation: Below normal speed for age/gender General Gait Details: Slow and somewhat guarded  gait pattern. Close supervision for safety.   Stairs Stairs: Yes Stairs assistance: Min guard Stair Management: One rail Right;Step to pattern;Forwards Number of Stairs: 10 General stair comments: Close guard for safety. No physical assit required.   Wheelchair Mobility    Modified Rankin (Stroke Patients Only)       Balance Overall balance assessment: Needs assistance Sitting-balance support: Feet supported;No upper extremity supported Sitting balance-Leahy Scale: Fair     Standing balance support: No upper extremity supported;During functional activity Standing balance-Leahy Scale: Fair                               Pertinent Vitals/Pain Pain Assessment: 0-10 Pain Score: 5  Pain Location: back Pain Descriptors / Indicators: Operative site guarding;Aching Pain Intervention(s): Limited activity within patient's tolerance;Monitored during session;Repositioned    Home Living Family/patient expects to be discharged to:: Private residence Living Arrangements: Alone Available Help at Discharge: Family;Available 24 hours/day (daughter) Type of Home: House Home Access: Stairs to enter Entrance Stairs-Rails: Psychiatric nurse of Steps: 5 Home Layout: Two level Home Equipment: Hand held shower head Additional Comments: spouse passed away 5 weeks ago    Prior Function Level of Independence: Independent               Hand Dominance        Extremity/Trunk Assessment   Upper Extremity Assessment: Defer to OT evaluation           Lower Extremity Assessment: Generalized weakness      Cervical / Trunk Assessment: Kyphotic  Communication   Communication: No difficulties  Cognition Arousal/Alertness: Awake/alert Behavior During Therapy: WFL for tasks assessed/performed Overall Cognitive Status: Within Functional Limits for tasks assessed  General Comments      Exercises         Assessment/Plan    PT Assessment Patient needs continued PT services  PT Diagnosis Difficulty walking;Acute pain   PT Problem List Decreased strength;Decreased range of motion;Decreased activity tolerance;Decreased balance;Decreased mobility;Decreased knowledge of use of DME;Decreased safety awareness;Decreased knowledge of precautions;Pain  PT Treatment Interventions DME instruction;Gait training;Stair training;Functional mobility training;Therapeutic activities;Therapeutic exercise;Neuromuscular re-education;Patient/family education   PT Goals (Current goals can be found in the Care Plan section) Acute Rehab PT Goals Patient Stated Goal: Return home PT Goal Formulation: With patient/family Time For Goal Achievement: 07/15/15 Potential to Achieve Goals: Good    Frequency Min 5X/week   Barriers to discharge        Co-evaluation               End of Session Equipment Utilized During Treatment: Gait belt Activity Tolerance: Patient tolerated treatment well Patient left: in chair;with call bell/phone within reach;with family/visitor present Nurse Communication: Mobility status         Time: [id number] PT Time Calculation (min) (ACUTE ONLY): 25 min   Charges:   PT Evaluation $PT Eval Moderate Complexity: 1 Procedure PT Treatments $Gait Training: 8-22 mins   PT G Codes:        Rolinda Roan 22-Jul-2015, 10:00 AM   Rolinda Roan, PT, DPT Acute Rehabilitation Services Pager: [phone]

## 2015-07-13 NOTE — Progress Notes (Signed)
Physical Therapy Evaluation Addendum for G-Codes    07-26-15 0951  PT G-Codes **NOT FOR INPATIENT CLASS**  Functional Assessment Tool Used Clinical judgement  Functional Limitation Mobility: Walking and moving around  Mobility: Walking and Moving Around Current Status [phone]) CI  Mobility: Walking and Moving Around Goal Status [id number]) CI   Rolinda Roan, PT, DPT Acute Rehabilitation Services Pager: [phone]

## 2015-07-15 NOTE — Progress Notes (Signed)
   07/08/15 1015  OT Visit Information  Last OT Received On 07/08/15  Assistance Needed +1  History of Present Illness Pt is a 75 y/o female who presents s/p L2-L3 laminectomy/decompression.  OT G-codes **NOT FOR INPATIENT CLASS**  Functional Assessment Tool Used clinical judgement  Functional Limitation Self care  Self Care Current Status [phone]) CI  Self Care Goal Status [id number]) CI   Late entry for g-code status  Tyrone Schimke OTR/L Pager: [phone]

## 2015-10-31 NOTE — H&P (Signed)
See scanned note.

## 2015-11-01 ENCOUNTER — Ambulatory Visit: Payer: Medicare Other | Admitting: Anesthesiology

## 2015-11-01 ENCOUNTER — Encounter
Admission: RE | Disposition: A | Discharge status: Home / Self Care | Payer: Self-pay | Source: Ambulatory Visit | Attending: Ophthalmology

## 2015-11-01 ENCOUNTER — Encounter: Payer: Self-pay | Admitting: *Deleted

## 2015-11-01 ENCOUNTER — Ambulatory Visit
Admission: RE | Admit: 2015-11-01 | Discharge: 2015-11-01 | Disposition: A | Discharge status: Home / Self Care | Payer: Medicare Other | Source: Ambulatory Visit | Attending: Ophthalmology | Admitting: Ophthalmology

## 2015-11-01 DIAGNOSIS — Z791 Long term (current) use of non-steroidal anti-inflammatories (NSAID): Secondary | ICD-10-CM | POA: Diagnosis not present

## 2015-11-01 DIAGNOSIS — H2511 Age-related nuclear cataract, right eye: Secondary | ICD-10-CM | POA: Diagnosis not present

## 2015-11-01 DIAGNOSIS — Z9889 Other specified postprocedural states: Secondary | ICD-10-CM | POA: Diagnosis not present

## 2015-11-01 DIAGNOSIS — H25011 Cortical age-related cataract, right eye: Secondary | ICD-10-CM | POA: Insufficient documentation

## 2015-11-01 DIAGNOSIS — Z885 Allergy status to narcotic agent status: Secondary | ICD-10-CM | POA: Insufficient documentation

## 2015-11-01 DIAGNOSIS — H269 Unspecified cataract: Secondary | ICD-10-CM | POA: Diagnosis present

## 2015-11-01 DIAGNOSIS — Z7982 Long term (current) use of aspirin: Secondary | ICD-10-CM | POA: Diagnosis not present

## 2015-11-01 DIAGNOSIS — Z79899 Other long term (current) drug therapy: Secondary | ICD-10-CM | POA: Insufficient documentation

## 2015-11-01 DIAGNOSIS — Z8673 Personal history of transient ischemic attack (TIA), and cerebral infarction without residual deficits: Secondary | ICD-10-CM | POA: Insufficient documentation

## 2015-11-01 DIAGNOSIS — M1990 Unspecified osteoarthritis, unspecified site: Secondary | ICD-10-CM | POA: Diagnosis not present

## 2015-11-01 DIAGNOSIS — I10 Essential (primary) hypertension: Secondary | ICD-10-CM | POA: Insufficient documentation

## 2015-11-01 HISTORY — PX: CATARACT EXTRACTION W/PHACO: SHX586

## 2015-11-01 SURGERY — PHACOEMULSIFICATION, CATARACT, WITH IOL INSERTION
Anesthesia: Monitor Anesthesia Care | Site: Eye | Laterality: Right | Wound class: Clean

## 2015-11-01 MED ORDER — TETRACAINE HCL 0.5 % OP SOLN
OPHTHALMIC | Status: DC | PRN
  Administered 2015-11-01: 1 [drp] via OPHTHALMIC

## 2015-11-01 MED ORDER — CEFUROXIME OPHTHALMIC INJECTION 1 MG/0.1 ML
INJECTION | OPHTHALMIC | Status: DC | PRN
  Administered 2015-11-01: .1 mL via INTRACAMERAL

## 2015-11-01 MED ORDER — ALFENTANIL 500 MCG/ML IJ INJ
INJECTION | INTRAMUSCULAR | Status: DC | PRN
  Administered 2015-11-01: 500 ug via INTRAVENOUS

## 2015-11-01 MED ORDER — PHENYLEPHRINE HCL 10 % OP SOLN
1.0000 [drp] | OPHTHALMIC | Status: DC | PRN
  Administered 2015-11-01 (×4): 1 [drp] via OPHTHALMIC

## 2015-11-01 MED ORDER — CYCLOPENTOLATE HCL 2 % OP SOLN
1.0000 [drp] | OPHTHALMIC | Status: DC | PRN
Start: 2015-11-01 — End: 2015-11-01
  Administered 2015-11-01 (×4): 1 [drp] via OPHTHALMIC

## 2015-11-01 MED ORDER — LIDOCAINE HCL (PF) 4 % IJ SOLN
INTRAMUSCULAR | Status: AC
  Filled 2015-11-01: qty 5

## 2015-11-01 MED ORDER — NA CHONDROIT SULF-NA HYALURON 40-17 MG/ML IO SOLN
INTRAOCULAR | Status: DC | PRN
  Administered 2015-11-01: 1 mL via INTRAOCULAR

## 2015-11-01 MED ORDER — LIDOCAINE HCL (PF) 4 % IJ SOLN
INTRAOCULAR | Status: DC | PRN
  Administered 2015-11-01: .5 mL via OPHTHALMIC

## 2015-11-01 MED ORDER — POVIDONE-IODINE 5 % OP SOLN
OPHTHALMIC | Status: AC
  Filled 2015-11-01: qty 30

## 2015-11-01 MED ORDER — PHENYLEPHRINE HCL 10 % OP SOLN
OPHTHALMIC | Status: AC
  Administered 2015-11-01: 1 [drp] via OPHTHALMIC
  Filled 2015-11-01: qty 5

## 2015-11-01 MED ORDER — LIDOCAINE HCL (PF) 4 % IJ SOLN
INTRAMUSCULAR | Status: DC | PRN
  Administered 2015-11-01: 4 mL via OPHTHALMIC

## 2015-11-01 MED ORDER — ONDANSETRON HCL 4 MG/2ML IJ SOLN
INTRAMUSCULAR | Status: DC | PRN
  Administered 2015-11-01: 4 mg via INTRAVENOUS

## 2015-11-01 MED ORDER — CYCLOPENTOLATE HCL 2 % OP SOLN
OPHTHALMIC | Status: AC
  Administered 2015-11-01: 1 [drp] via OPHTHALMIC
  Filled 2015-11-01: qty 2

## 2015-11-01 MED ORDER — MOXIFLOXACIN HCL 0.5 % OP SOLN
1.0000 [drp] | OPHTHALMIC | Status: DC | PRN
  Administered 2015-11-01 (×3): 1 [drp] via OPHTHALMIC

## 2015-11-01 MED ORDER — POVIDONE-IODINE 5 % OP SOLN
OPHTHALMIC | Status: DC | PRN
  Administered 2015-11-01: 1 via OPHTHALMIC

## 2015-11-01 MED ORDER — CEFUROXIME OPHTHALMIC INJECTION 1 MG/0.1 ML
INJECTION | OPHTHALMIC | Status: AC
  Filled 2015-11-01: qty 0.1

## 2015-11-01 MED ORDER — MOXIFLOXACIN HCL 0.5 % OP SOLN
OPHTHALMIC | Status: AC
  Filled 2015-11-01: qty 3

## 2015-11-01 MED ORDER — EPINEPHRINE HCL 1 MG/ML IJ SOLN
INTRAMUSCULAR | Status: DC | PRN
  Administered 2015-11-01: 1 mL via OPHTHALMIC

## 2015-11-01 MED ORDER — TETRACAINE HCL 0.5 % OP SOLN
OPHTHALMIC | Status: AC
  Filled 2015-11-01: qty 2

## 2015-11-01 MED ORDER — MOXIFLOXACIN HCL 0.5 % OP SOLN
OPHTHALMIC | Status: DC | PRN
  Administered 2015-11-01: 1 [drp] via OPHTHALMIC

## 2015-11-01 MED ORDER — CARBACHOL 0.01 % IO SOLN
INTRAOCULAR | Status: DC | PRN
  Administered 2015-11-01: .5 mL via INTRAOCULAR

## 2015-11-01 MED ORDER — SODIUM CHLORIDE 0.9 % IV SOLN
INTRAVENOUS | Status: DC
  Administered 2015-11-01: 08:00:00 via INTRAVENOUS

## 2015-11-01 MED ORDER — BUPIVACAINE HCL (PF) 0.75 % IJ SOLN
INTRAMUSCULAR | Status: AC
  Filled 2015-11-01: qty 10

## 2015-11-01 MED ORDER — EPINEPHRINE HCL 1 MG/ML IJ SOLN
INTRAMUSCULAR | Status: AC
  Filled 2015-11-01: qty 1

## 2015-11-01 MED ORDER — HYALURONIDASE HUMAN 150 UNIT/ML IJ SOLN
INTRAMUSCULAR | Status: AC
  Filled 2015-11-01: qty 1

## 2015-11-01 MED ORDER — NA CHONDROIT SULF-NA HYALURON 40-17 MG/ML IO SOLN
INTRAOCULAR | Status: AC
  Filled 2015-11-01: qty 1

## 2015-11-01 SURGICAL SUPPLY — 29 items

## 2015-11-01 NOTE — Anesthesia Postprocedure Evaluation (Signed)
Anesthesia Post Note  Patient: Kristina Banks  Procedure(s) Performed: Procedure(s) (LRB): CATARACT EXTRACTION PHACO AND INTRAOCULAR LENS PLACEMENT (IOC) (Right)  Patient location during evaluation: PACU Anesthesia Type: MAC Level of consciousness: awake Pain management: pain level controlled Vital Signs Assessment: post-procedure vital signs reviewed and stable Respiratory status: spontaneous breathing Cardiovascular status: stable Anesthetic complications: no    Last Vitals:  Filed Vitals:   11/01/15 1044 11/01/15 1100  BP: 157/60 155/81  Pulse: 59 59  Temp: 35.9 C   Resp: 16     Last Pain: There were no vitals filed for this visit.               VAN STAVEREN,Kyrell Ruacho

## 2015-11-01 NOTE — Op Note (Signed)
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 Item Serial No. Manufacturer Lot No. LRB No. Used  LENS IOL ACRYSOF IQ 22.5 - [id number] Intraocular Lens LENS IOL ACRYSOF IQ 22.5 [id number] ALCON   Right 1   Anesthesia: 2% Lidocaine and 4% Marcaine in a 50/50 mixture with 10 unites/ml of Hylenex given as a peribulbar Anesthesiologist: Anesthesiologist: Gijsbertus F Boston Service, MD CRNA: Timoteo Expose, CRNA Complications: none Estimated Blood Loss: less than 1 ml  Description of procedure:  The patient was given anesthesia and sedation via intravenous access. The patient was then prepped and draped in the usual fashion. A 25-gauge needle was bent for initiating the capsulorhexis. A 5-0 silk suture was placed through the conjunctiva superior and inferiorly to serve as bridle sutures. Hemostasis was obtained at the superior limbus using an eraser cautery. A partial thickness groove was made at the anterior surgical limbus with a 64 Beaver blade and this was dissected anteriorly with an [employer]aya. The anterior chamber was entered at 10 o'clock with a 1.0 mm paracentesis knife and through the lamellar dissection with a 2.6 mm Alcon keratome. Epi-Shugarcaine 0.5 CC [9 cc BSS Plus (Alcon), 3 cc 4% preservative-free lidocaine (Hospira) and 4 cc 1:1000 preservative-free, bisulfite-free epinephrine] was injected into the anterior chamber via the paracentesis tract. Epi-Shugarcaine 0.5 CC [9 cc BSS Plus (Alcon), 3 cc 4% preservative-free lidocaine (Hospira) and 4 cc 1:1000 preservative-free, bisulfite-free epinephrine] was injected into the anterior chamber via the paracentesis tract. DiscoVisc was  injected to replace the aqueous and a continuous tear curvilinear capsulorhexis was performed using a bent 25-gauge needle.  Balance salt on a syringe was used to perform hydro-dissection and phacoemulsification was carried out using a divide and conquer technique. Procedure(s) with comments: CATARACT EXTRACTION PHACO AND INTRAOCULAR LENS PLACEMENT (IOC) (Right) - [place] 01:18 AP% 22.8 CDE 36.00 fluid pack lot # [id number]. Irrigation/aspiration was used to remove the residual cortex and the capsular bag was inflated with DiscoVisc. The intraocular lens was inserted into the capsular bag using a pre-loaded UltraSert Delivery System. Irrigation/aspiration was used to remove the residual DiscoVisc. The wound was inflated with balanced salt and checked for leaks. None were found. Miostat was injected via the paracentesis track and 0.1 ml of cefuroxime containing 1 mg of drug  was injected via the paracentesis track. The wound was checked for leaks again and none were found.   The bridal sutures were removed and two drops of Vigamox were placed on the eye. An eye shield was placed to protect the eye and the patient was discharged to the recovery area in good condition.   Bernyce Brimley MD

## 2015-11-01 NOTE — Transfer of Care (Signed)
Immediate Anesthesia Transfer of Care Note  Patient: Kristina Banks  Procedure(s) Performed: Procedure(s) with comments: CATARACT EXTRACTION PHACO AND INTRAOCULAR LENS PLACEMENT (IOC) (Right) - [place] 01:18 AP% 22.8 CDE 36.00 fluid pack lot # [id number]  Patient Location: short stay  Anesthesia Type:MAC  Level of Consciousness: awake and alert   Airway & Oxygen Therapy: Patient Spontanous Breathing  Post-op Assessment: Report given to RN  Post vital signs: Reviewed and stable  Last Vitals:  Filed Vitals:   11/01/15 0815 11/01/15 1044  BP: 138/99 157/60  Pulse: 65 98  Temp: 36.5 C 35.8 C  Resp: 16     Last Pain: There were no vitals filed for this visit.       Complications: No apparent anesthesia complications

## 2015-11-01 NOTE — Anesthesia Preprocedure Evaluation (Signed)
Anesthesia Evaluation  Patient identified by MRN, date of birth, ID band  History of Anesthesia Complications (+) MALIGNANT HYPERTHERMIA  Airway Mallampati: II       Dental  (+) Caps   Pulmonary shortness of breath,    Pulmonary exam normal        Cardiovascular Exercise Tolerance: Good hypertension, Pt. on medications  Rhythm:Regular Rate:Normal     Neuro/Psych CVA    GI/Hepatic negative GI ROS, Neg liver ROS,   Endo/Other  negative endocrine ROS  Renal/GU negative Renal ROS     Musculoskeletal   Abdominal Normal abdominal exam  (+)   Peds  Hematology negative hematology ROS (+)   Anesthesia Other Findings   Reproductive/Obstetrics                             Anesthesia Physical Anesthesia Plan  ASA: II  Anesthesia Plan: MAC   Post-op Pain Management:    Induction: Intravenous  Airway Management Planned: Natural Airway and Nasal Cannula  Additional Equipment:   Intra-op Plan:   Post-operative Plan:   Informed Consent: I have reviewed the patients History and Physical, chart, labs and discussed the procedure including the risks, benefits and alternatives for the proposed anesthesia with the patient or authorized representative who has indicated his/her understanding and acceptance.     Plan Discussed with: CRNA  Anesthesia Plan Comments:         Anesthesia Quick Evaluation

## 2015-11-01 NOTE — Interval H&P Note (Signed)
History and Physical Interval Note:  11/01/2015 10:00 AM  Kristina Banks  has presented today for surgery, with the diagnosis of cataract  The various methods of treatment have been discussed with the patient and family. After consideration of risks, benefits and other options for treatment, the patient has consented to  Procedure(s): CATARACT EXTRACTION PHACO AND INTRAOCULAR LENS PLACEMENT (Drew) (Right) as a surgical intervention .  The patient's history has been reviewed, patient examined, no change in status, stable for surgery.  I have reviewed the patient's chart and labs.  Questions were answered to the patient's satisfaction.     Ronon Ferger

## 2015-11-01 NOTE — Anesthesia Postprocedure Evaluation (Signed)
Anesthesia Post Note  Patient: Kristina Banks  Procedure(s) Performed: Procedure(s) (LRB): CATARACT EXTRACTION PHACO AND INTRAOCULAR LENS PLACEMENT (IOC) (Right)  Patient location during evaluation: Short Stay Anesthesia Type: MAC Level of consciousness: awake and awake and alert Pain management: pain level controlled Vital Signs Assessment: post-procedure vital signs reviewed and stable Respiratory status: spontaneous breathing Cardiovascular status: blood pressure returned to baseline Postop Assessment: no headache Anesthetic complications: no    Last Vitals:  Filed Vitals:   11/01/15 0815 11/01/15 1044  BP: 138/99 157/60  Pulse: 65 59  Temp: 36.5 C 35.9 C  Resp: 16 16    Last Pain: There were no vitals filed for this visit.               Efrain Clauson

## 2015-11-01 NOTE — Discharge Instructions (Addendum)
See handout.  Eye Surgery Discharge Instructions  Expect mild scratchy sensation or mild soreness. DO NOT RUB YOUR EYE!  The day of surgery:  Minimal physical activity, but bed rest is not required  No reading, computer work, or close hand work  No bending, lifting, or straining.  May watch TV  For 24 hours:  No driving, legal decisions, or alcoholic beverages  Safety precautions  Eat anything you prefer: It is better to start with liquids, then soup then solid foods.  _____ Eye patch should be worn until postoperative exam tomorrow.  ____ Solar shield eyeglasses should be worn for comfort in the sunlight/patch while sleeping  Resume all regular medications including aspirin or Coumadin if these were discontinued prior to surgery. You may shower, bathe, shave, or wash your hair. Tylenol may be taken for mild discomfort.  Call your doctor if you experience significant pain, nausea, or vomiting, fever > 101 or other signs of infection. [phone] or [phone] Specific instructions:  Follow-up Information    Follow up with Estill Cotta, MD.   Specialty:  Ophthalmology   Why:  6-2o0   Contact information:   [place] [address] [phone]       Follow up with Estill Cotta, MD.   Specialty:  Ophthalmology   Why:  11-02-15 at 10:40   Contact information:   [place] [address] [phone]      Eye Surgery Discharge Instructions  Expect mild scratchy sensation or mild soreness. DO NOT RUB YOUR EYE!  The day of surgery:  Minimal physical activity, but bed rest is not required  No reading, computer work, or close hand work  No bending, lifting, or straining.  May watch TV  For 24 hours:  No driving, legal decisions, or alcoholic beverages  Safety precautions  Eat anything you prefer: It is better to start with liquids, then soup then solid foods.  _____ Eye patch should be worn until  postoperative exam tomorrow.  ____ Solar shield eyeglasses should be worn for comfort in the sunlight/patch while sleeping  Resume all regular medications including aspirin or Coumadin if these were discontinued prior to surgery. You may shower, bathe, shave, or wash your hair. Tylenol may be taken for mild discomfort.  Call your doctor if you experience significant pain, nausea, or vomiting, fever > 101 or other signs of infection. [phone] or [phone] Specific instructions:  Follow-up Information    Follow up with Estill Cotta, MD.   Specialty:  Ophthalmology   Why:  6-2o0   Contact information:   [place] [address] [phone]       Follow up with Estill Cotta, MD.   Specialty:  Ophthalmology   Why:  11-02-15 at 10:40   Contact information:   [address]   [place] [address] [phone]

## 2015-11-29 DIAGNOSIS — C4492 Squamous cell carcinoma of skin, unspecified: Secondary | ICD-10-CM

## 2015-11-29 HISTORY — DX: Squamous cell carcinoma of skin, unspecified: C44.92

## 2015-12-14 DIAGNOSIS — M25469 Effusion, unspecified knee: Secondary | ICD-10-CM | POA: Insufficient documentation

## 2015-12-15 ENCOUNTER — Encounter: Payer: Self-pay | Admitting: *Deleted

## 2015-12-21 NOTE — H&P (Signed)
See scanned note.

## 2015-12-22 ENCOUNTER — Ambulatory Visit
Admission: RE | Admit: 2015-12-22 | Discharge: 2015-12-22 | Disposition: A | Discharge status: Home / Self Care | Payer: Medicare Other | Source: Ambulatory Visit | Attending: Ophthalmology | Admitting: Ophthalmology

## 2015-12-22 ENCOUNTER — Encounter
Admission: RE | Disposition: A | Discharge status: Home / Self Care | Payer: Self-pay | Source: Ambulatory Visit | Attending: Ophthalmology

## 2015-12-22 ENCOUNTER — Encounter: Payer: Self-pay | Admitting: Anesthesiology

## 2015-12-22 ENCOUNTER — Ambulatory Visit: Payer: Medicare Other | Admitting: Anesthesiology

## 2015-12-22 DIAGNOSIS — M1990 Unspecified osteoarthritis, unspecified site: Secondary | ICD-10-CM | POA: Insufficient documentation

## 2015-12-22 DIAGNOSIS — R0602 Shortness of breath: Secondary | ICD-10-CM | POA: Diagnosis not present

## 2015-12-22 DIAGNOSIS — Z8673 Personal history of transient ischemic attack (TIA), and cerebral infarction without residual deficits: Secondary | ICD-10-CM | POA: Diagnosis not present

## 2015-12-22 DIAGNOSIS — I10 Essential (primary) hypertension: Secondary | ICD-10-CM | POA: Insufficient documentation

## 2015-12-22 DIAGNOSIS — G709 Myoneural disorder, unspecified: Secondary | ICD-10-CM | POA: Insufficient documentation

## 2015-12-22 DIAGNOSIS — H2512 Age-related nuclear cataract, left eye: Secondary | ICD-10-CM | POA: Diagnosis present

## 2015-12-22 HISTORY — PX: CATARACT EXTRACTION W/PHACO: SHX586

## 2015-12-22 HISTORY — DX: Essential (primary) hypertension: I10

## 2015-12-22 SURGERY — PHACOEMULSIFICATION, CATARACT, WITH IOL INSERTION
Anesthesia: Monitor Anesthesia Care | Site: Eye | Laterality: Left | Wound class: Clean

## 2015-12-22 MED ORDER — CARBACHOL 0.01 % IO SOLN
INTRAOCULAR | Status: DC | PRN
  Administered 2015-12-22: .5 mL via INTRAOCULAR

## 2015-12-22 MED ORDER — TETRACAINE HCL 0.5 % OP SOLN
OPHTHALMIC | Status: AC
  Filled 2015-12-22: qty 2

## 2015-12-22 MED ORDER — LIDOCAINE HCL (PF) 4 % IJ SOLN
INTRAMUSCULAR | Status: DC | PRN
  Administered 2015-12-22: 4 mL via OPHTHALMIC

## 2015-12-22 MED ORDER — HYALURONIDASE HUMAN 150 UNIT/ML IJ SOLN
INTRAMUSCULAR | Status: AC
  Filled 2015-12-22: qty 1

## 2015-12-22 MED ORDER — MOXIFLOXACIN HCL 0.5 % OP SOLN
OPHTHALMIC | Status: DC | PRN
  Administered 2015-12-22: 1 [drp] via OPHTHALMIC

## 2015-12-22 MED ORDER — NA CHONDROIT SULF-NA HYALURON 40-17 MG/ML IO SOLN
INTRAOCULAR | Status: DC | PRN
  Administered 2015-12-22: 1 mL via INTRAOCULAR

## 2015-12-22 MED ORDER — CEFUROXIME OPHTHALMIC INJECTION 1 MG/0.1 ML
INJECTION | OPHTHALMIC | Status: AC
  Filled 2015-12-22: qty 0.1

## 2015-12-22 MED ORDER — EPINEPHRINE HCL 1 MG/ML IJ SOLN
INTRAOCULAR | Status: DC | PRN
  Administered 2015-12-22: 1 mL via OPHTHALMIC

## 2015-12-22 MED ORDER — MIDAZOLAM HCL 2 MG/2ML IJ SOLN
INTRAMUSCULAR | Status: DC | PRN
  Administered 2015-12-22: 1 mg via INTRAVENOUS

## 2015-12-22 MED ORDER — MOXIFLOXACIN HCL 0.5 % OP SOLN
1.0000 [drp] | OPHTHALMIC | Status: AC | PRN
Start: 2015-12-22 — End: 2015-12-22
  Administered 2015-12-22 (×3): 1 [drp] via OPHTHALMIC

## 2015-12-22 MED ORDER — EPINEPHRINE HCL 1 MG/ML IJ SOLN
INTRAMUSCULAR | Status: AC
  Filled 2015-12-22: qty 2

## 2015-12-22 MED ORDER — PHENYLEPHRINE HCL 10 % OP SOLN
1.0000 [drp] | OPHTHALMIC | Status: AC | PRN
  Administered 2015-12-22 (×4): 1 [drp] via OPHTHALMIC

## 2015-12-22 MED ORDER — LIDOCAINE HCL (PF) 4 % IJ SOLN
INTRAMUSCULAR | Status: AC
  Filled 2015-12-22: qty 5

## 2015-12-22 MED ORDER — POVIDONE-IODINE 5 % OP SOLN
OPHTHALMIC | Status: AC
  Filled 2015-12-22: qty 30

## 2015-12-22 MED ORDER — BUPIVACAINE HCL (PF) 0.75 % IJ SOLN
INTRAMUSCULAR | Status: AC
  Filled 2015-12-22: qty 10

## 2015-12-22 MED ORDER — POVIDONE-IODINE 5 % OP SOLN
OPHTHALMIC | Status: DC | PRN
  Administered 2015-12-22: 1 via OPHTHALMIC

## 2015-12-22 MED ORDER — PHENYLEPHRINE HCL 10 % OP SOLN
OPHTHALMIC | Status: AC
  Administered 2015-12-22: 1 [drp] via OPHTHALMIC
  Filled 2015-12-22: qty 5

## 2015-12-22 MED ORDER — ALFENTANIL 500 MCG/ML IJ INJ
INJECTION | INTRAMUSCULAR | Status: DC | PRN
  Administered 2015-12-22: 500 ug via INTRAVENOUS

## 2015-12-22 MED ORDER — TETRACAINE HCL 0.5 % OP SOLN
OPHTHALMIC | Status: DC | PRN
  Administered 2015-12-22: 1 [drp] via OPHTHALMIC

## 2015-12-22 MED ORDER — LIDOCAINE HCL (PF) 4 % IJ SOLN
INTRAMUSCULAR | Status: DC | PRN
  Administered 2015-12-22: .5 mL via OPHTHALMIC

## 2015-12-22 MED ORDER — MOXIFLOXACIN HCL 0.5 % OP SOLN
OPHTHALMIC | Status: AC
  Administered 2015-12-22: 1 [drp] via OPHTHALMIC
  Filled 2015-12-22: qty 3

## 2015-12-22 MED ORDER — CEFUROXIME OPHTHALMIC INJECTION 1 MG/0.1 ML
INJECTION | OPHTHALMIC | Status: DC | PRN
  Administered 2015-12-22: .1 mL via INTRACAMERAL

## 2015-12-22 MED ORDER — CYCLOPENTOLATE HCL 2 % OP SOLN
OPHTHALMIC | Status: AC
  Administered 2015-12-22: 1 [drp] via OPHTHALMIC
  Filled 2015-12-22: qty 2

## 2015-12-22 MED ORDER — NA CHONDROIT SULF-NA HYALURON 40-17 MG/ML IO SOLN
INTRAOCULAR | Status: AC
  Filled 2015-12-22: qty 1

## 2015-12-22 MED ORDER — SODIUM CHLORIDE 0.9 % IV SOLN
INTRAVENOUS | Status: DC
Start: 2015-12-22 — End: 2015-12-22
  Administered 2015-12-22: 10:00:00 via INTRAVENOUS

## 2015-12-22 MED ORDER — CYCLOPENTOLATE HCL 2 % OP SOLN
1.0000 [drp] | OPHTHALMIC | Status: AC | PRN
  Administered 2015-12-22 (×4): 1 [drp] via OPHTHALMIC

## 2015-12-22 SURGICAL SUPPLY — 29 items

## 2015-12-22 NOTE — Op Note (Signed)
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 Item Serial No. Manufacturer Lot No. LRB No. Used  LENS IOL ACRYSOF IQ 22.5 - [id number] Intraocular Lens LENS IOL ACRYSOF IQ 22.5 [id number] ALCON   Left 1   Anesthesia: 2% Lidocaine and 4% Marcaine in a 50/50 mixture with 10 unites/ml of Hylenex given as a peribulbar Anesthesiologist: Anesthesiologist: Gunnar Bulla, MD CRNA: Courtney Paris, CRNA Complications: none Estimated Blood Loss: less than 1 ml  Description of procedure:  The patient was given anesthesia and sedation via intravenous access. The patient was then prepped and draped in the usual fashion. A 25-gauge needle was bent for initiating the capsulorhexis. A 5-0 silk suture was placed through the conjunctiva superior and inferiorly to serve as bridle sutures. Hemostasis was obtained at the superior limbus using an eraser cautery. A partial thickness groove was made at the anterior surgical limbus with a 64 Beaver blade and this was dissected anteriorly with an Avaya. The anterior chamber was entered at 10 o'clock with a 1.0 mm paracentesis knife and through the lamellar dissection with a 2.6 mm Alcon keratome. Epi-Shugarcaine 0.5 CC [9 cc BSS Plus (Alcon), 3 cc 4% preservative-free lidocaine (Hospira) and 4 cc 1:1000 preservative-free, bisulfite-free epinephrine] was injected into the anterior chamber via the paracentesis tract. Epi-Shugarcaine 0.5 CC [9 cc BSS Plus (Alcon), 3 cc 4% preservative-free lidocaine (Hospira) and 4 cc 1:1000 preservative-free, bisulfite-free epinephrine] was injected into the anterior chamber via the paracentesis tract. DiscoVisc was injected to replace  the aqueous and a continuous tear curvilinear capsulorhexis was performed using a bent 25-gauge needle.  Balance salt on a syringe was used to perform hydro-dissection and phacoemulsification was carried out using a divide and conquer technique. Procedure(s) with comments: CATARACT EXTRACTION PHACO AND INTRAOCULAR LENS PLACEMENT ([facility]) (Left) - [place] 00:51 AP% 23.4 CDE 22.40 Fluid pack lot # [id number]. Irrigation/aspiration was used to remove the residual cortex and the capsular bag was inflated with DiscoVisc. The intraocular lens was inserted into the capsular bag using a pre-loaded UltraSert Delivery System. Irrigation/aspiration was used to remove the residual DiscoVisc. The wound was inflated with balanced salt and checked for leaks. None were found. Miostat was injected via the paracentesis track and 0.1 ml of cefuroxime containing 1 mg of drug  was injected via the paracentesis track. The wound was checked for leaks again and none were found.   The bridal sutures were removed and two drops of Vigamox were placed on the eye. An eye shield was placed to protect the eye and the patient was discharged to the recovery area in good condition.   Iliany Losier MD

## 2015-12-22 NOTE — Anesthesia Preprocedure Evaluation (Addendum)
Anesthesia Evaluation  Patient identified by MRN, date of birth, ID band Patient awake    Reviewed: Allergy & Precautions, NPO status , Patient's Chart, lab work & pertinent test results, reviewed documented beta blocker date and time   History of Anesthesia Complications (+) PONV and history of anesthetic complications  Airway Mallampati: II  TM Distance: >3 FB     Dental  (+) Chipped   Pulmonary shortness of breath,           Cardiovascular hypertension,      Neuro/Psych  Neuromuscular disease    GI/Hepatic   Endo/Other    Renal/GU      Musculoskeletal  (+) Arthritis ,   Abdominal   Peds  Hematology   Anesthesia Other Findings No overt stroke, but has had TIAs. EKG from a year ago ok.  Reproductive/Obstetrics                            Anesthesia Physical Anesthesia Plan  ASA: III  Anesthesia Plan: MAC   Post-op Pain Management:    Induction:   Airway Management Planned:   Additional Equipment:   Intra-op Plan:   Post-operative Plan:   Informed Consent: I have reviewed the patients History and Physical, chart, labs and discussed the procedure including the risks, benefits and alternatives for the proposed anesthesia with the patient or authorized representative who has indicated his/her understanding and acceptance.     Plan Discussed with: CRNA  Anesthesia Plan Comments:         Anesthesia Quick Evaluation

## 2015-12-22 NOTE — Interval H&P Note (Signed)
History and Physical Interval Note:  12/22/2015 10:42 AM  Kristina Banks  has presented today for surgery, with the diagnosis of cataract  The various methods of treatment have been discussed with the patient and family. After consideration of risks, benefits and other options for treatment, the patient has consented to  Procedure(s): CATARACT EXTRACTION PHACO AND INTRAOCULAR LENS PLACEMENT ([place]) (Left) as a surgical intervention .  The patient's history has been reviewed, patient examined, no change in status, stable for surgery.  I have reviewed the patient's chart and labs.  Questions were answered to the patient's satisfaction.     Shi Blankenship

## 2015-12-22 NOTE — Anesthesia Postprocedure Evaluation (Signed)
Anesthesia Post Note  Patient: Kristina Banks  Procedure(s) Performed: Procedure(s) (LRB): CATARACT EXTRACTION PHACO AND INTRAOCULAR LENS PLACEMENT ([facility]) (Left)  Patient location during evaluation: Other Anesthesia Type: MAC Level of consciousness: awake and alert Pain management: pain level controlled Vital Signs Assessment: post-procedure vital signs reviewed and stable Respiratory status: spontaneous breathing, nonlabored ventilation, respiratory function stable and patient connected to nasal cannula oxygen Cardiovascular status: stable and blood pressure returned to baseline Anesthetic complications: no    Last Vitals:  Vitals:   12/22/15 1126 12/22/15 1128  BP: (!) 161/71   Pulse: 61 61  Resp: 16 18  Temp: 36.5 C 36.3 C    Last Pain:  Vitals:   12/22/15 0927  TempSrc: Oral                 Jakhai Fant S

## 2015-12-22 NOTE — Transfer of Care (Signed)
Immediate Anesthesia Transfer of Care Note  Patient: PAM FINNIGAN  Procedure(s) Performed: Procedure(s) with comments: CATARACT EXTRACTION PHACO AND INTRAOCULAR LENS PLACEMENT ([facility]) (Left) - [place] 00:51 AP% 23.4 CDE 22.40 Fluid pack lot # [id number]  Patient Location: PACU and Short Stay  Anesthesia Type:MAC  Level of Consciousness: awake, oriented and patient cooperative  Airway & Oxygen Therapy: Patient Spontanous Breathing  Post-op Assessment: Report given to RN and Post -op Vital signs reviewed and stable  Post vital signs: Reviewed and stable  Last Vitals:  Vitals:   12/22/15 0927  BP: (!) 154/71  Pulse: 62  Resp: 16  Temp: 36.4 C    Last Pain:  Vitals:   12/22/15 0927  TempSrc: Oral         Complications: No apparent anesthesia complications

## 2015-12-22 NOTE — Discharge Instructions (Signed)
Eye Surgery Discharge Instructions  Expect mild scratchy sensation or mild soreness. DO NOT RUB YOUR EYE!  The day of surgery:  Minimal physical activity, but bed rest is not required  No reading, computer work, or close hand work  No bending, lifting, or straining.  May watch TV  For 24 hours:  No driving, legal decisions, or alcoholic beverages  Safety precautions  Eat anything you prefer: It is better to start with liquids, then soup then solid foods.  _____ Eye patch should be worn until postoperative exam tomorrow.  ____ Solar shield eyeglasses should be worn for comfort in the sunlight/patch while sleeping  Resume all regular medications including aspirin or Coumadin if these were discontinued prior to surgery. You may shower, bathe, shave, or wash your hair. Tylenol may be taken for mild discomfort.  Call your doctor if you experience significant pain, nausea, or vomiting, fever > 101 or other signs of infection. [phone] or [phone] Specific instructions:  Follow-up Information    Marolyn Urschel, MD .   Specialty:  Ophthalmology Why:  August 10 at 8:40am Contact information: [address]   [place] [address] [phone]

## 2016-01-03 DIAGNOSIS — C44319 Basal cell carcinoma of skin of other parts of face: Secondary | ICD-10-CM

## 2016-01-03 HISTORY — DX: Basal cell carcinoma of skin of other parts of face: C44.319

## 2017-04-17 ENCOUNTER — Encounter: Payer: Self-pay | Admitting: *Deleted

## 2017-04-18 ENCOUNTER — Encounter: Payer: Self-pay | Admitting: *Deleted

## 2017-04-18 ENCOUNTER — Encounter
Admission: RE | Disposition: A | Discharge status: Home / Self Care | Payer: Self-pay | Source: Ambulatory Visit | Attending: Unknown Physician Specialty

## 2017-04-18 ENCOUNTER — Ambulatory Visit: Payer: Medicare Other | Admitting: Anesthesiology

## 2017-04-18 ENCOUNTER — Ambulatory Visit
Admission: RE | Admit: 2017-04-18 | Discharge: 2017-04-18 | Disposition: A | Discharge status: Home / Self Care | Payer: Medicare Other | Source: Ambulatory Visit | Attending: Unknown Physician Specialty | Admitting: Unknown Physician Specialty

## 2017-04-18 DIAGNOSIS — Q438 Other specified congenital malformations of intestine: Secondary | ICD-10-CM | POA: Diagnosis not present

## 2017-04-18 DIAGNOSIS — Z1211 Encounter for screening for malignant neoplasm of colon: Secondary | ICD-10-CM | POA: Diagnosis present

## 2017-04-18 DIAGNOSIS — Z79899 Other long term (current) drug therapy: Secondary | ICD-10-CM | POA: Diagnosis not present

## 2017-04-18 DIAGNOSIS — I10 Essential (primary) hypertension: Secondary | ICD-10-CM | POA: Diagnosis not present

## 2017-04-18 DIAGNOSIS — K5730 Diverticulosis of large intestine without perforation or abscess without bleeding: Secondary | ICD-10-CM | POA: Insufficient documentation

## 2017-04-18 DIAGNOSIS — Z7982 Long term (current) use of aspirin: Secondary | ICD-10-CM | POA: Insufficient documentation

## 2017-04-18 DIAGNOSIS — Z8673 Personal history of transient ischemic attack (TIA), and cerebral infarction without residual deficits: Secondary | ICD-10-CM | POA: Insufficient documentation

## 2017-04-18 DIAGNOSIS — Z800 Family history of malignant neoplasm of digestive organs: Secondary | ICD-10-CM | POA: Diagnosis not present

## 2017-04-18 DIAGNOSIS — Z888 Allergy status to other drugs, medicaments and biological substances status: Secondary | ICD-10-CM | POA: Insufficient documentation

## 2017-04-18 DIAGNOSIS — M1990 Unspecified osteoarthritis, unspecified site: Secondary | ICD-10-CM | POA: Diagnosis not present

## 2017-04-18 DIAGNOSIS — K640 First degree hemorrhoids: Secondary | ICD-10-CM | POA: Insufficient documentation

## 2017-04-18 DIAGNOSIS — K635 Polyp of colon: Secondary | ICD-10-CM | POA: Diagnosis not present

## 2017-04-18 DIAGNOSIS — Z885 Allergy status to narcotic agent status: Secondary | ICD-10-CM | POA: Insufficient documentation

## 2017-04-18 HISTORY — PX: COLONOSCOPY WITH PROPOFOL: SHX5780

## 2017-04-18 HISTORY — DX: Transient cerebral ischemic attack, unspecified: G45.9

## 2017-04-18 HISTORY — DX: Other cervical disc degeneration, unspecified cervical region: M50.30

## 2017-04-18 SURGERY — COLONOSCOPY WITH PROPOFOL
Anesthesia: General

## 2017-04-18 MED ORDER — FENTANYL CITRATE (PF) 100 MCG/2ML IJ SOLN
INTRAMUSCULAR | Status: AC
  Filled 2017-04-18: qty 2

## 2017-04-18 MED ORDER — PROPOFOL 500 MG/50ML IV EMUL
INTRAVENOUS | Status: DC | PRN
  Administered 2017-04-18: 140 ug/kg/min via INTRAVENOUS

## 2017-04-18 MED ORDER — SODIUM CHLORIDE 0.9 % IV SOLN: INTRAVENOUS | Status: DC

## 2017-04-18 MED ORDER — PROPOFOL 10 MG/ML IV BOLUS
INTRAVENOUS | Status: AC
  Filled 2017-04-18: qty 20

## 2017-04-18 MED ORDER — LIDOCAINE 2% (20 MG/ML) 5 ML SYRINGE
INTRAMUSCULAR | Status: DC | PRN
  Administered 2017-04-18: 30 mg via INTRAVENOUS

## 2017-04-18 MED ORDER — PROPOFOL 500 MG/50ML IV EMUL
INTRAVENOUS | Status: AC
Start: 2017-04-18 — End: 2017-04-18
  Filled 2017-04-18: qty 50

## 2017-04-18 MED ORDER — FENTANYL CITRATE (PF) 100 MCG/2ML IJ SOLN
INTRAMUSCULAR | Status: DC | PRN
  Administered 2017-04-18 (×2): 50 ug via INTRAVENOUS

## 2017-04-18 MED ORDER — LIDOCAINE HCL (PF) 2 % IJ SOLN
INTRAMUSCULAR | Status: AC
  Filled 2017-04-18: qty 10

## 2017-04-18 MED ORDER — SODIUM CHLORIDE 0.9 % IV SOLN
INTRAVENOUS | Status: DC
  Administered 2017-04-18: 09:00:00 via INTRAVENOUS

## 2017-04-18 MED ORDER — PROPOFOL 10 MG/ML IV BOLUS
INTRAVENOUS | Status: DC | PRN
  Administered 2017-04-18: 80 mg via INTRAVENOUS

## 2017-04-18 NOTE — Anesthesia Postprocedure Evaluation (Signed)
Anesthesia Post Note  Patient: Kristina Banks  Procedure(s) Performed: COLONOSCOPY WITH PROPOFOL (N/A )  Patient location during evaluation: Endoscopy Anesthesia Type: General Level of consciousness: awake and alert Pain management: pain level controlled Vital Signs Assessment: post-procedure vital signs reviewed and stable Respiratory status: spontaneous breathing, nonlabored ventilation, respiratory function stable and patient connected to nasal cannula oxygen Cardiovascular status: blood pressure returned to baseline and stable Postop Assessment: no apparent nausea or vomiting Anesthetic complications: no     Last Vitals:  Vitals:   04/18/17 0930 04/18/17 0940  BP: (!) 118/58 (!) 118/58  Pulse: 61 67  Resp: (!) 7 18  Temp:    SpO2: 97% 100%    Last Pain:  Vitals:   04/18/17 0920  TempSrc: Tympanic                 Martha Clan

## 2017-04-18 NOTE — Transfer of Care (Signed)
Immediate Anesthesia Transfer of Care Note  Patient: Kristina Banks  Procedure(s) Performed: COLONOSCOPY WITH PROPOFOL (N/A )  Patient Location: PACU and Endoscopy Unit  Anesthesia Type:General  Level of Consciousness: sedated  Airway & Oxygen Therapy: Patient Spontanous Breathing and Patient connected to nasal cannula oxygen  Post-op Assessment: Report given to RN and Post -op Vital signs reviewed and stable  Post vital signs: Reviewed and stable  Last Vitals:  Vitals:   04/18/17 0814 04/18/17 0920  BP: (!) 155/70   Pulse: 65 (P) 61  Resp: 16 (P) 18  Temp: (!) 36.1 C (!) (P) 36.2 C  SpO2: 100% (P) 100%    Last Pain:  Vitals:   04/18/17 0920  TempSrc: (P) Tympanic         Complications: No apparent anesthesia complications

## 2017-04-18 NOTE — H&P (Signed)
Primary Care Physician:  Derinda Late, MD Primary Gastroenterologist:  Dr. Vira Agar  Pre-Procedure History & Physical: HPI:  Kristina Banks is a 76 y.o. female is here for an colonoscopy.   Past Medical History:  Diagnosis Date  . Arthritis   . Change in hair   . Complication of anesthesia    "takes a little longer to wake up"   . DDD (degenerative disc disease), cervical   . Discolored nails    TOENAILS  . HBP (high blood pressure)   . Hypertension   . Muscle pain   . PONV (postoperative nausea and vomiting)    nausea  . Shortness of breath dyspnea    with exertion  . Stroke [facility]) 2006   tia  . TIA (transient ischemic attack)     Past Surgical History:  Procedure Laterality Date  . ABDOMINAL HYSTERECTOMY    . ARM SURGERY Left    BONE TUMOR  . ARTHOSCOPIC ROTAOR CUFF REPAIR Left 12/16/2014   Procedure: ARTHROSCOPIC ROTATOR CUFF REPAIR;  Surgeon: Earnestine Leys, MD;  Location: [facility] ORS;  Service: Orthopedics;  Laterality: Left;  . bone tumor removed     Lt arm  . CATARACT EXTRACTION W/PHACO Right 11/01/2015   Procedure: CATARACT EXTRACTION PHACO AND INTRAOCULAR LENS PLACEMENT ([facility]);  Surgeon: Estill Cotta, MD;  Location: [facility] ORS;  Service: Ophthalmology;  Laterality: Right;  [place] 01:18 AP% 22.8 CDE 36.00 fluid pack lot # [id number]  . CATARACT EXTRACTION W/PHACO Left 12/22/2015   Procedure: CATARACT EXTRACTION PHACO AND INTRAOCULAR LENS PLACEMENT ([facility]);  Surgeon: Estill Cotta, MD;  Location: [facility] ORS;  Service: Ophthalmology;  Laterality: Left;  [place] 00:51 AP% 23.4 CDE 22.40 Fluid pack lot # [id number]  . COLONOSCOPY    . FOOT SURGERY Right    10.3.14  . FRACTURE SURGERY    . fractured knee Left   . LUMBAR LAMINECTOMY/DECOMPRESSION MICRODISCECTOMY Left 07/07/2015   Procedure: Left Lumbar two-three Extraforaminal Microdiskectomy;  Surgeon: Leeroy Cha, MD;  Location: [place] ORS;  Service: Neurosurgery;  Laterality: Left;  Left L2-3 Extraforaminal  Microdiskectomy  . TONSILLECTOMY      Prior to Admission medications   Medication Sig Start Date End Date Taking? Authorizing Provider  Calcium Carbonate-Vitamin D (CALCIUM 600 + D PO) Take 1 tablet by mouth daily.    Yes [provider]  hydrochlorothiazide (HYDRODIURIL) 25 MG tablet Take 25 mg by mouth daily.   Yes [provider]  meloxicam (MOBIC) 15 MG tablet Take 1 tablet (15 mg total) by mouth daily. 06/16/15  Yes Jule Ser, DO  omega-3 acid ethyl esters (LOVAZA) 1 g capsule Take 1 g by mouth daily.   Yes [provider]  aspirin 81 MG tablet Take 81 mg by mouth daily.    [provider]    Allergies as of 10/25/2016 - Review Complete 12/22/2015  Allergen Reaction Noted  . Codeine Nausea And Vomiting and Other (See Comments) 02/14/2013  . Cephalexin Other (See Comments) 02/26/2013  . Oxycodone Other (See Comments) 12/08/2014    History reviewed. No pertinent family history.  Social History   Socioeconomic History  . Marital status: Married    Spouse name: Not on file  . Number of children: Not on file  . Years of education: Not on file  . Highest education level: Not on file  Social Needs  . Financial resource strain: Not on file  . Food insecurity - worry: Not on file  . Food insecurity - inability: Not on file  .  Transportation needs - medical: Not on file  . Transportation needs - non-medical: Not on file  Occupational History  . Not on file  Tobacco Use  . Smoking status: Never Smoker  . Smokeless tobacco: Never Used  Substance and Sexual Activity  . Alcohol use: No  . Drug use: No  . Sexual activity: Not on file  Other Topics Concern  . Not on file  Social History Narrative  . Not on file    Review of Systems: See HPI, otherwise negative ROS  Physical Exam: BP (!) 155/70   Pulse 65   Temp (!) 97 F (36.1 C) (Tympanic)   Resp 16   Ht 5\' 5"  (1.651 m)   Wt 65.8 kg (145 lb)   SpO2 100%   BMI 24.13 kg/m   General:   Alert,  pleasant and cooperative in NAD Head:  Normocephalic and atraumatic. Neck:  Supple; no masses or thyromegaly. Lungs:  Clear throughout to auscultation.    Heart:  Regular rate and rhythm. Abdomen:  Soft, nontender and nondistended. Normal bowel sounds, without guarding, and without rebound.   Neurologic:  Alert and  oriented x4;  grossly normal neurologically.  Impression/Plan: Kristina Banks is here for an colonoscopy to be performed for FH colon cancer in 2 first degree relatives  Risks, benefits, limitations, and alternatives regarding  colonoscopy have been reviewed with the patient.  Questions have been answered.  All parties agreeable.   Gaylyn Cheers, MD  04/18/2017, 8:39 AM

## 2017-04-18 NOTE — Anesthesia Preprocedure Evaluation (Signed)
Anesthesia Evaluation  Patient identified by MRN, date of birth, ID band Patient awake    Reviewed: Allergy & Precautions, H&P , NPO status , Patient's Chart, lab work & pertinent test results, reviewed documented beta blocker date and time   History of Anesthesia Complications (+) PONV and history of anesthetic complications  Airway Mallampati: I  TM Distance: >3 FB Neck ROM: full    Dental  (+) Caps, Dental Advidsory Given, Teeth Intact   Pulmonary neg pulmonary ROS,           Cardiovascular Exercise Tolerance: Good hypertension, (-) angina(-) CAD, (-) Past MI, (-) Cardiac Stents and (-) CABG (-) dysrhythmias (-) Valvular Problems/Murmurs     Neuro/Psych neg Seizures TIA Neuromuscular disease negative psych ROS   GI/Hepatic negative GI ROS, Neg liver ROS,   Endo/Other  negative endocrine ROS  Renal/GU negative Renal ROS  negative genitourinary   Musculoskeletal   Abdominal   Peds  Hematology negative hematology ROS (+)   Anesthesia Other Findings Past Medical History: No date: Arthritis No date: Change in hair No date: Complication of anesthesia     Comment:  "takes a little longer to wake up"  No date: DDD (degenerative disc disease), cervical No date: Discolored nails     Comment:  TOENAILS No date: HBP (high blood pressure) No date: Hypertension No date: Muscle pain No date: PONV (postoperative nausea and vomiting)     Comment:  nausea No date: Shortness of breath dyspnea     Comment:  with exertion 2006: Stroke ([place])     Comment:  tia No date: TIA (transient ischemic attack)   Reproductive/Obstetrics negative OB ROS                             Anesthesia Physical Anesthesia Plan  ASA: II  Anesthesia Plan: General   Post-op Pain Management:    Induction: Intravenous  PONV Risk Score and Plan: 4 or greater and Propofol infusion  Airway Management Planned: Nasal  Cannula  Additional Equipment:   Intra-op Plan:   Post-operative Plan:   Informed Consent: I have reviewed the patients History and Physical, chart, labs and discussed the procedure including the risks, benefits and alternatives for the proposed anesthesia with the patient or authorized representative who has indicated his/her understanding and acceptance.   Dental Advisory Given  Plan Discussed with: Anesthesiologist, CRNA and Surgeon  Anesthesia Plan Comments:         Anesthesia Quick Evaluation

## 2017-04-18 NOTE — Anesthesia Post-op Follow-up Note (Signed)
Anesthesia QCDR form completed.        

## 2017-04-18 NOTE — Op Note (Signed)
[facility] Gastroenterology Patient Name: Kristina Banks Procedure Date: 04/18/2017 8:43 AM MRN: [id number] Account #: [id number] Date of Birth: Mar 04, 1941 Admit Type: Outpatient Age: 76 Room: [facility] ENDO ROOM 3 Gender: Female Note Status: Finalized Procedure:            Colonoscopy Indications:          Family history of colon cancer in multiple first-degree                        relatives Providers:            Manya Silvas, MD Referring MD:         Caprice Renshaw MD (Referring MD) Medicines:            Propofol per Anesthesia Complications:        No immediate complications. Procedure:            Pre-Anesthesia Assessment:                       - After reviewing the risks and benefits, the patient                        was deemed in satisfactory condition to undergo the                        procedure.                       After obtaining informed consent, the colonoscope was                        passed under direct vision. Throughout the procedure,                        the patient's blood pressure, pulse, and oxygen                        saturations were monitored continuously. The                        Colonoscope was introduced through the anus and                        advanced to the the cecum, identified by appendiceal                        orifice and ileocecal valve. The colonoscopy was                        somewhat difficult due to a tortuous colon. Successful                        completion of the procedure was aided by applying                        abdominal pressure. The patient tolerated the procedure                        well. The quality of the bowel preparation was  excellent. Findings:      A small polyp was found in the cecum. The polyp was sessile. The polyp       was removed with a hot snare. Resection and retrieval were complete. To       prevent bleeding after the polypectomy, one hemostatic  clip was       successfully placed. There was no bleeding during, or at the end, of the       procedure.      Many small-mouthed diverticula were found in the sigmoid colon.      Internal hemorrhoids were found during endoscopy. The hemorrhoids were       small and Grade I (internal hemorrhoids that do not prolapse). Impression:           - One small polyp in the cecum, removed with a hot                        snare. Resected and retrieved. Clip was placed.                       - Diverticulosis in the sigmoid colon.                       - Internal hemorrhoids. Recommendation:       - Await pathology results. Manya Silvas, MD 04/18/2017 9:25:31 AM This report has been signed electronically. Number of Addenda: 0 Note Initiated On: 04/18/2017 8:43 AM Scope Withdrawal Time: 0 hours 10 minutes 8 seconds  Total Procedure Duration: 0 hours 25 minutes 51 seconds       [facility]

## 2017-04-19 ENCOUNTER — Encounter: Payer: Self-pay | Admitting: Unknown Physician Specialty

## 2017-04-19 LAB — SURGICAL PATHOLOGY

## 2017-06-15 DIAGNOSIS — M5430 Sciatica, unspecified side: Secondary | ICD-10-CM | POA: Insufficient documentation

## 2017-12-06 IMAGING — CT CT ABD-PELV W/ CM
2 of 5 series · 16 of 46 positions shown, 18 images · IV contrast (omnipaque)
Comparison: [HOSPITAL] CT Abdomen and Pelvis
09/20/2006.

CLINICAL DATA: 74-year-old female with left groin pain. Query
hernia. Initial encounter.

EXAM:
CT ABDOMEN AND PELVIS WITH CONTRAST
TECHNIQUE: Multidetector CT imaging of the abdomen and pelvis was performed
using the standard protocol following bolus administration of
intravenous contrast.
CONTRAST:  125 mL Omnipaque 300

[Series 2: axial soft tissue · axial · 0.62mm/px · z∈[-884,-479]mm · 13 of 91 slices shown, 15 images]
[im 5/91  soft-tissue]
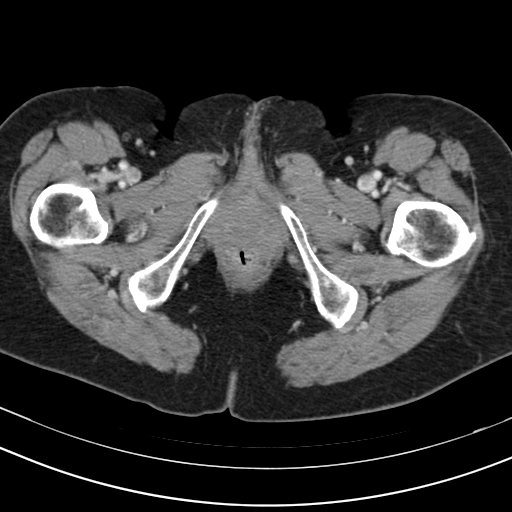
[im 5/91  bone]
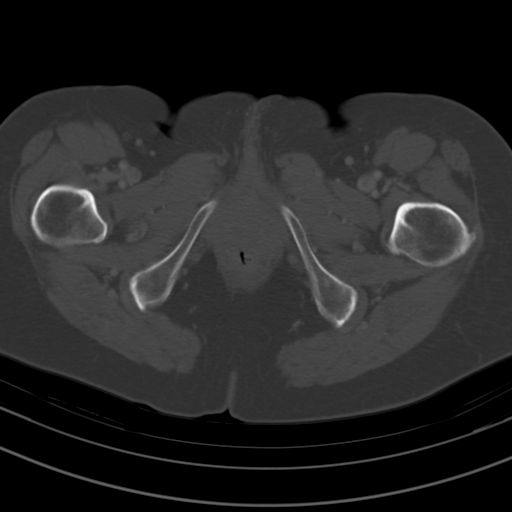
[im 14/91  soft-tissue]
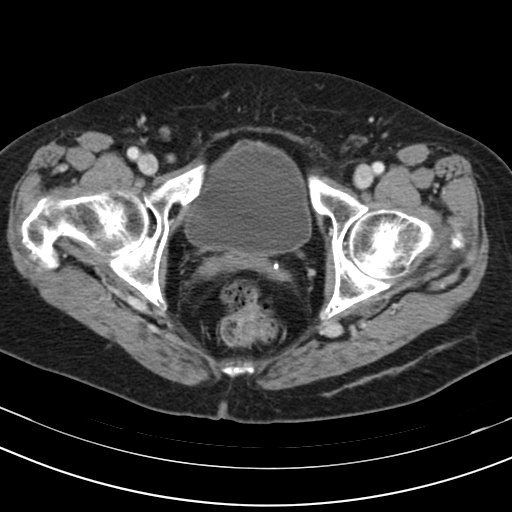
[im 19/91  soft-tissue]
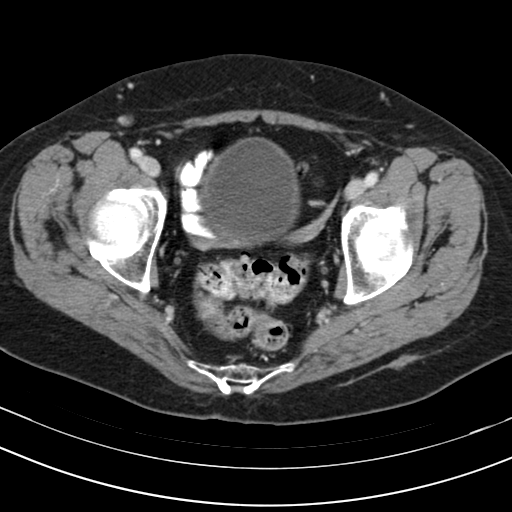
[im 28/91  soft-tissue]
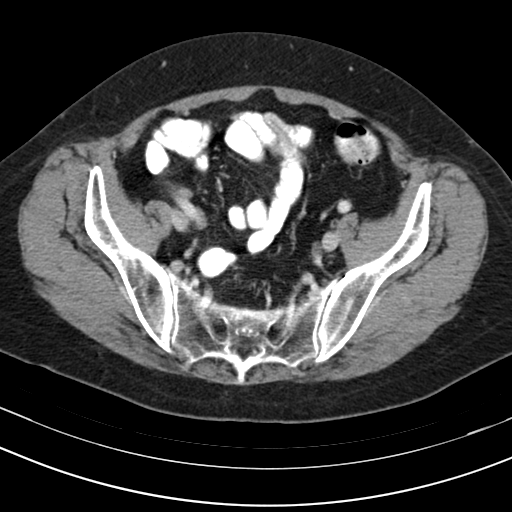
[im 32/91  soft-tissue]
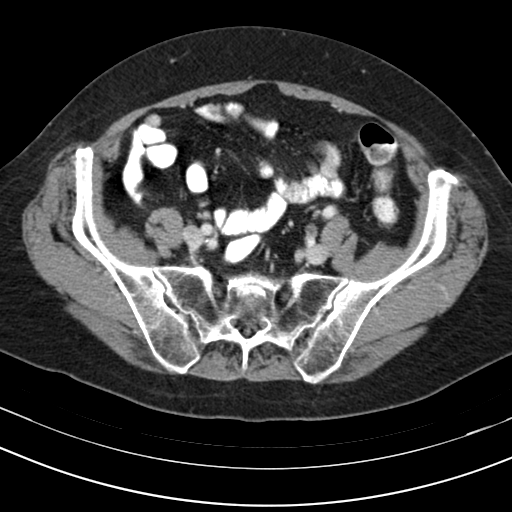
[im 41/91  soft-tissue]
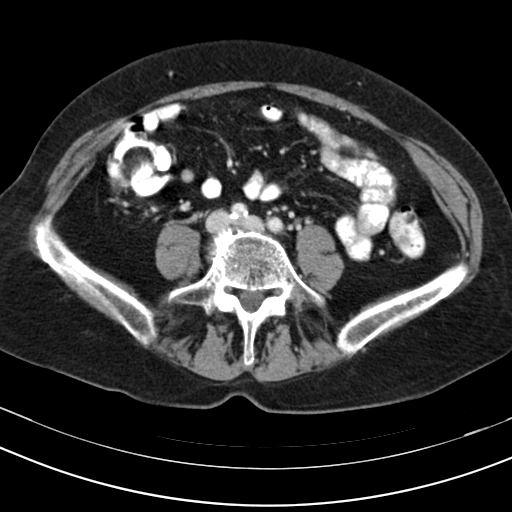
[im 46/91  soft-tissue]
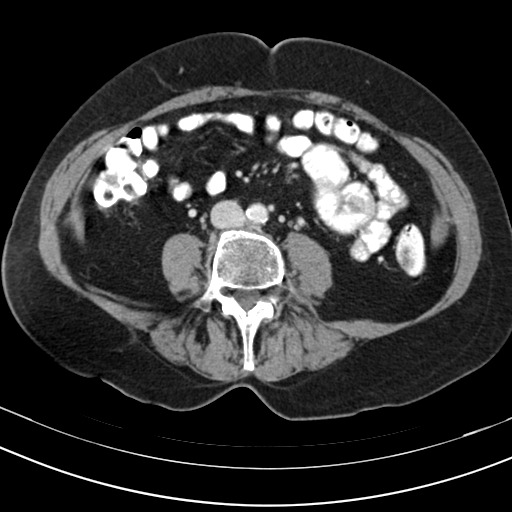
[im 50/91  soft-tissue]
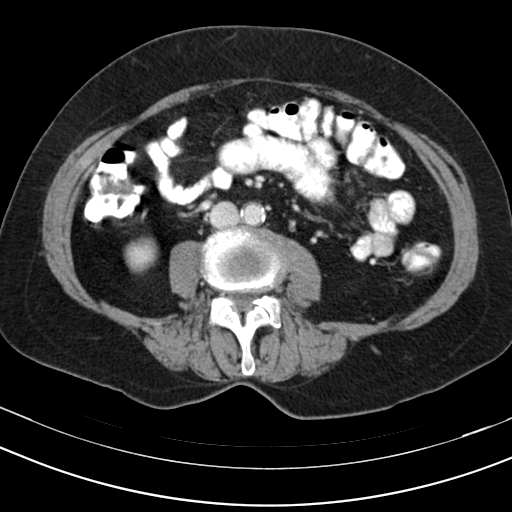
[im 59/91  soft-tissue]
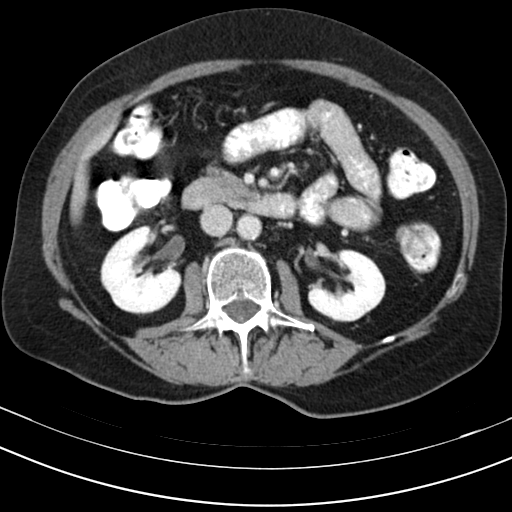
[im 59/91  bone]
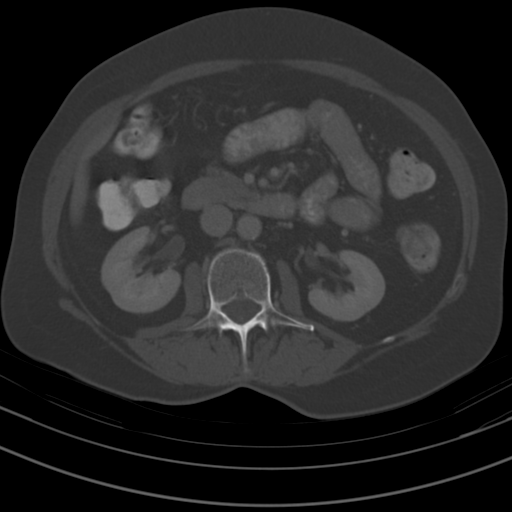
[im 64/91  soft-tissue]
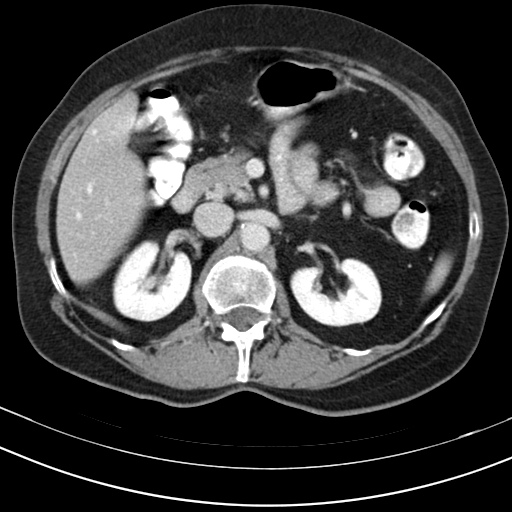
[im 73/91  soft-tissue]
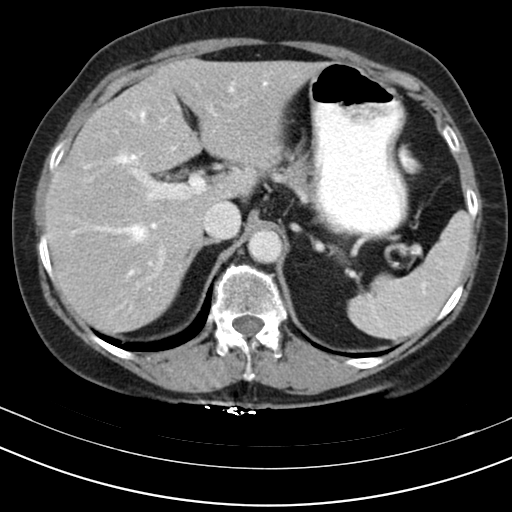
[im 77/91  soft-tissue]
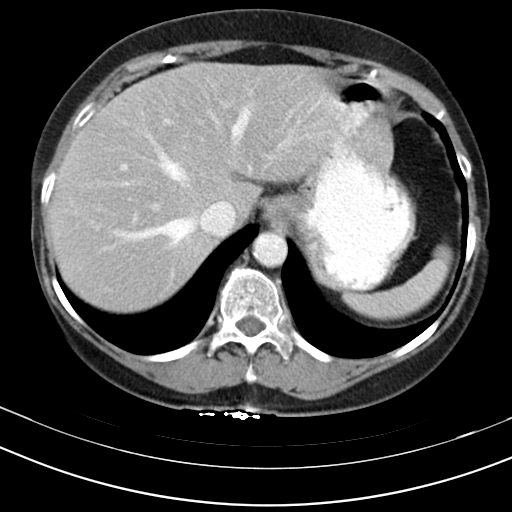
[im 86/91  soft-tissue]
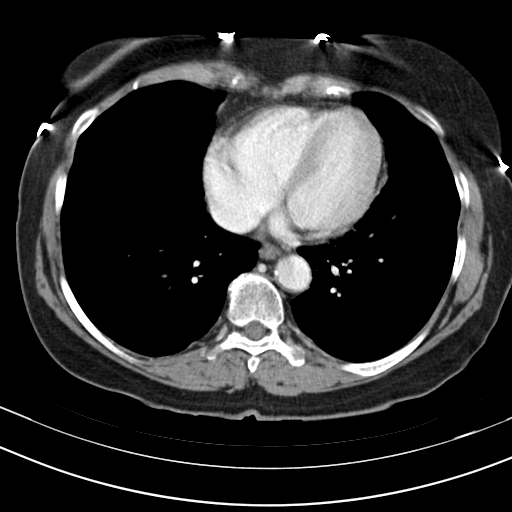

[Series 602: coronal · coronal · 0.87mm/px · 3 of 105 slices shown]
[im 35/105  soft-tissue]
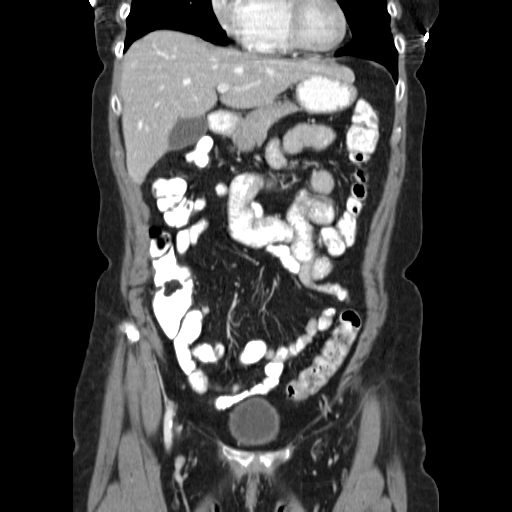
[im 47/105  soft-tissue]
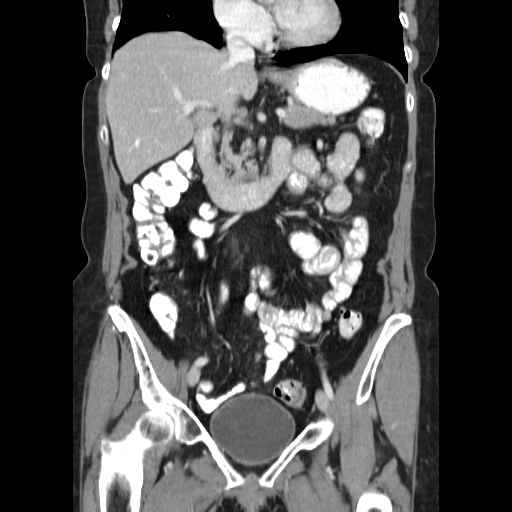
[im 58/105  soft-tissue]
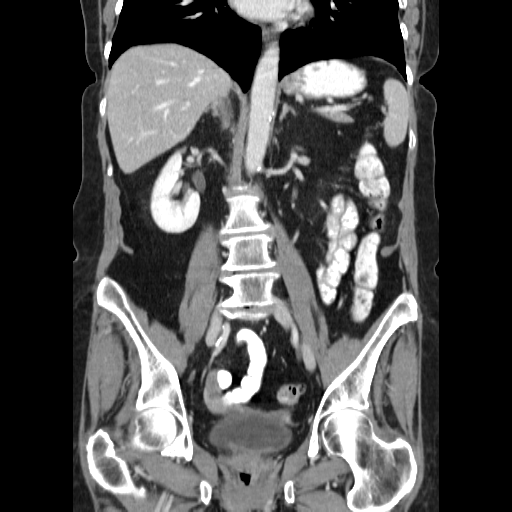

[16 of 46 positions shown; findings below may reference images not displayed]

FINDINGS: Negative lung bases. Chronic elevation of the right hemidiaphragm.
No pericardial or pleural effusion.

Osteopenia. Small sclerotic probable bone island of the left L4
vertebral body, mildly increased in size since 8 years ago. No acute
osseous abnormality identified.

No pelvic free fluid. Negative urinary bladder. Small chronic left
hemipelvis phlebolith. Surgically absent uterus. Negative at adnexa.
No inguinal or pelvic sidewall lymphadenopathy. No inguinal or
femoral hernia there is some soft and calcified plaque in the
visible left femoral arteries which remain patent.

Sigmoid diverticulosis, no active inflammation. Oral contrast has
reached the distal sigmoid colon. Left colon, transverse colon,
right colon, appendix, and terminal ileum are within normal limits.
No dilated or abnormal small bowel loops. Negative stomach and
duodenum.

No abdominal free fluid or free air. Mild chronic hepatic steatosis
appears stable. Otherwise negative liver, gallbladder, spleen,
pancreas and adrenal glands. Portal venous system is patent.
Aortoiliac calcified atherosclerosis noted. Major arterial
structures in the abdomen and pelvis are patent. Bilateral renal
enhancement and contrast excretion within normal limits. No
perinephric or periureteral stranding. Negative course of the left
ureter.
IMPRESSION: 1. No acute or inflammatory findings in the abdomen or pelvis. No
inguinal hernia or source of left groin pain identified.
2. Calcified aortic atherosclerosis. Diverticulosis of the sigmoid
colon.

## 2018-05-31 DIAGNOSIS — Z0000 Encounter for general adult medical examination without abnormal findings: Secondary | ICD-10-CM | POA: Insufficient documentation

## 2018-05-31 DIAGNOSIS — M4147 Neuromuscular scoliosis, lumbosacral region: Secondary | ICD-10-CM | POA: Insufficient documentation

## 2018-05-31 DIAGNOSIS — I10 Essential (primary) hypertension: Secondary | ICD-10-CM | POA: Insufficient documentation

## 2018-08-19 DIAGNOSIS — M818 Other osteoporosis without current pathological fracture: Secondary | ICD-10-CM | POA: Insufficient documentation

## 2018-09-06 ENCOUNTER — Other Ambulatory Visit: Payer: Self-pay | Admitting: Neurology

## 2018-09-06 DIAGNOSIS — R519 Headache, unspecified: Secondary | ICD-10-CM

## 2018-09-06 DIAGNOSIS — G500 Trigeminal neuralgia: Secondary | ICD-10-CM

## 2018-09-06 DIAGNOSIS — R51 Headache: Secondary | ICD-10-CM

## 2018-09-27 ENCOUNTER — Ambulatory Visit
Admission: RE | Admit: 2018-09-27 | Discharge: 2018-09-27 | Disposition: A | Discharge status: Home / Self Care | Payer: Medicare Other | Source: Ambulatory Visit | Attending: Neurology | Admitting: Neurology

## 2018-09-27 ENCOUNTER — Other Ambulatory Visit: Payer: Self-pay

## 2018-09-27 DIAGNOSIS — R51 Headache: Secondary | ICD-10-CM | POA: Insufficient documentation

## 2018-09-27 DIAGNOSIS — R519 Headache, unspecified: Secondary | ICD-10-CM

## 2018-09-27 MED ORDER — GADOBUTROL 1 MMOL/ML IV SOLN
6.0000 mL | Freq: Once | INTRAVENOUS | Status: AC | PRN
  Administered 2018-09-27: 14:00:00 6 mL via INTRAVENOUS

## 2018-12-11 DIAGNOSIS — M810 Age-related osteoporosis without current pathological fracture: Secondary | ICD-10-CM | POA: Insufficient documentation

## 2019-01-27 ENCOUNTER — Emergency Department: Payer: Medicare Other

## 2019-01-27 ENCOUNTER — Inpatient Hospital Stay
Admission: EM | Admit: 2019-01-27 | Discharge: 2019-01-28 | DRG: 065 | Disposition: A | Discharge status: Home / Self Care | Payer: Medicare Other | Attending: Internal Medicine | Admitting: Internal Medicine

## 2019-01-27 ENCOUNTER — Encounter: Payer: Self-pay | Admitting: Emergency Medicine

## 2019-01-27 ENCOUNTER — Other Ambulatory Visit: Payer: Self-pay

## 2019-01-27 DIAGNOSIS — R471 Dysarthria and anarthria: Secondary | ICD-10-CM | POA: Diagnosis not present

## 2019-01-27 DIAGNOSIS — R402362 Coma scale, best motor response, obeys commands, at arrival to emergency department: Secondary | ICD-10-CM | POA: Diagnosis present

## 2019-01-27 DIAGNOSIS — R531 Weakness: Secondary | ICD-10-CM

## 2019-01-27 DIAGNOSIS — R262 Difficulty in walking, not elsewhere classified: Secondary | ICD-10-CM | POA: Diagnosis present

## 2019-01-27 DIAGNOSIS — Z9841 Cataract extraction status, right eye: Secondary | ICD-10-CM | POA: Diagnosis not present

## 2019-01-27 DIAGNOSIS — Z20828 Contact with and (suspected) exposure to other viral communicable diseases: Secondary | ICD-10-CM | POA: Diagnosis not present

## 2019-01-27 DIAGNOSIS — Z79899 Other long term (current) drug therapy: Secondary | ICD-10-CM | POA: Diagnosis not present

## 2019-01-27 DIAGNOSIS — Z9842 Cataract extraction status, left eye: Secondary | ICD-10-CM | POA: Diagnosis not present

## 2019-01-27 DIAGNOSIS — Z23 Encounter for immunization: Secondary | ICD-10-CM

## 2019-01-27 DIAGNOSIS — Z888 Allergy status to other drugs, medicaments and biological substances status: Secondary | ICD-10-CM

## 2019-01-27 DIAGNOSIS — R402252 Coma scale, best verbal response, oriented, at arrival to emergency department: Secondary | ICD-10-CM | POA: Diagnosis present

## 2019-01-27 DIAGNOSIS — I639 Cerebral infarction, unspecified: Secondary | ICD-10-CM | POA: Diagnosis present

## 2019-01-27 DIAGNOSIS — Z885 Allergy status to narcotic agent status: Secondary | ICD-10-CM | POA: Diagnosis not present

## 2019-01-27 DIAGNOSIS — R29703 NIHSS score 3: Secondary | ICD-10-CM | POA: Diagnosis present

## 2019-01-27 DIAGNOSIS — I69992 Facial weakness following unspecified cerebrovascular disease: Secondary | ICD-10-CM

## 2019-01-27 DIAGNOSIS — R402142 Coma scale, eyes open, spontaneous, at arrival to emergency department: Secondary | ICD-10-CM | POA: Diagnosis not present

## 2019-01-27 DIAGNOSIS — I10 Essential (primary) hypertension: Secondary | ICD-10-CM | POA: Diagnosis present

## 2019-01-27 DIAGNOSIS — Z961 Presence of intraocular lens: Secondary | ICD-10-CM | POA: Diagnosis not present

## 2019-01-27 DIAGNOSIS — E785 Hyperlipidemia, unspecified: Secondary | ICD-10-CM | POA: Diagnosis not present

## 2019-01-27 DIAGNOSIS — G8194 Hemiplegia, unspecified affecting left nondominant side: Secondary | ICD-10-CM | POA: Diagnosis present

## 2019-01-27 DIAGNOSIS — Z7982 Long term (current) use of aspirin: Secondary | ICD-10-CM | POA: Diagnosis not present

## 2019-01-27 DIAGNOSIS — Z85828 Personal history of other malignant neoplasm of skin: Secondary | ICD-10-CM | POA: Diagnosis not present

## 2019-01-27 LAB — DIFFERENTIAL
Abs Immature Granulocytes: 0.15 10*3/uL — ABNORMAL HIGH (ref 0.00–0.07)
Basophils Absolute: 0 10*3/uL (ref 0.0–0.1)
Basophils Relative: 0 %
Eosinophils Absolute: 0.1 10*3/uL (ref 0.0–0.5)
Eosinophils Relative: 1 %
Immature Granulocytes: 1 %
Lymphocytes Relative: 15 %
Lymphs Abs: 2.3 10*3/uL (ref 0.7–4.0)
Monocytes Absolute: 0.9 10*3/uL (ref 0.1–1.0)
Monocytes Relative: 6 %
Neutro Abs: 11.5 10*3/uL — ABNORMAL HIGH (ref 1.7–7.7)
Neutrophils Relative %: 77 %

## 2019-01-27 LAB — COMPREHENSIVE METABOLIC PANEL
ALT: 20 U/L (ref 0–44)
AST: 21 U/L (ref 15–41)
Albumin: 4.1 g/dL (ref 3.5–5.0)
Alkaline Phosphatase: 41 U/L (ref 38–126)
Anion gap: 10 (ref 5–15)
BUN: 29 mg/dL — ABNORMAL HIGH (ref 8–23)
CO2: 22 mmol/L (ref 22–32)
Calcium: 9.6 mg/dL (ref 8.9–10.3)
Chloride: 105 mmol/L (ref 98–111)
Creatinine, Ser: 0.82 mg/dL (ref 0.44–1.00)
GFR calc Af Amer: >60 mL/min (ref >60)
GFR calc non Af Amer: >60 mL/min (ref >60)
Glucose, Bld: 151 mg/dL — ABNORMAL HIGH (ref 70–99)
Potassium: 3.7 mmol/L (ref 3.5–5.1)
Sodium: 137 mmol/L (ref 135–145)
Total Bilirubin: 0.7 mg/dL (ref 0.3–1.2)
Total Protein: 6.9 g/dL (ref 6.5–8.1)

## 2019-01-27 LAB — APTT: aPTT: 24 seconds (ref 24–36)

## 2019-01-27 LAB — CBC
HCT: 42.9 % (ref 36.0–46.0)
Hemoglobin: 14.5 g/dL (ref 12.0–15.0)
MCH: 30.9 pg (ref 26.0–34.0)
MCHC: 33.8 g/dL (ref 30.0–36.0)
MCV: 91.5 fL (ref 80.0–100.0)
Platelets: 291 10*3/uL (ref 150–400)
RBC: 4.69 MIL/uL (ref 3.87–5.11)
RDW: 12.6 % (ref 11.5–15.5)
WBC: 15 10*3/uL — ABNORMAL HIGH (ref 4.0–10.5)
nRBC: 0 % (ref 0.0–0.2)

## 2019-01-27 LAB — PROTIME-INR
INR: 0.9 (ref 0.8–1.2)
Prothrombin Time: 11.8 seconds (ref 11.4–15.2)

## 2019-01-27 LAB — GLUCOSE, CAPILLARY: Glucose-Capillary: 149 mg/dL — ABNORMAL HIGH (ref 70–99)

## 2019-01-27 MED ORDER — SODIUM CHLORIDE 0.9 % IV SOLN
INTRAVENOUS | Status: DC
  Administered 2019-01-27: 20:00:00 via INTRAVENOUS

## 2019-01-27 MED ORDER — CLOPIDOGREL BISULFATE 75 MG PO TABS
75.0000 mg | ORAL_TABLET | Freq: Every day | ORAL | Status: DC
  Administered 2019-01-27 – 2019-01-28 (×2): 75 mg via ORAL
  Filled 2019-01-27 (×2): qty 1

## 2019-01-27 MED ORDER — IOHEXOL 350 MG/ML SOLN
75.0000 mL | Freq: Once | INTRAVENOUS | Status: AC | PRN
  Administered 2019-01-27: 75 mL via INTRAVENOUS

## 2019-01-27 MED ORDER — SODIUM CHLORIDE 0.9% FLUSH: 3.0000 mL | Freq: Once | INTRAVENOUS | Status: DC

## 2019-01-27 MED ORDER — ACETAMINOPHEN 160 MG/5ML PO SOLN
650.0000 mg | ORAL | Status: DC | PRN
  Filled 2019-01-27: qty 20.3

## 2019-01-27 MED ORDER — INFLUENZA VAC A&B SA ADJ QUAD 0.5 ML IM PRSY
0.5000 mL | PREFILLED_SYRINGE | INTRAMUSCULAR | Status: AC
  Administered 2019-01-28: 10:00:00 0.5 mL via INTRAMUSCULAR
  Filled 2019-01-27: qty 0.5

## 2019-01-27 MED ORDER — HYDRALAZINE HCL 20 MG/ML IJ SOLN: 10.0000 mg | Freq: Four times a day (QID) | INTRAMUSCULAR | Status: DC | PRN

## 2019-01-27 MED ORDER — ASPIRIN EC 81 MG PO TBEC
81.0000 mg | DELAYED_RELEASE_TABLET | Freq: Every day | ORAL | Status: DC
  Administered 2019-01-28: 81 mg via ORAL
  Filled 2019-01-27: qty 1

## 2019-01-27 MED ORDER — STROKE: EARLY STAGES OF RECOVERY BOOK
Freq: Once | Status: AC
  Administered 2019-01-27: 20:00:00

## 2019-01-27 MED ORDER — ASPIRIN 81 MG PO CHEW
324.0000 mg | CHEWABLE_TABLET | Freq: Once | ORAL | Status: AC
  Administered 2019-01-27: 17:00:00 324 mg via ORAL
  Filled 2019-01-27: qty 4

## 2019-01-27 MED ORDER — ENOXAPARIN SODIUM 40 MG/0.4ML ~~LOC~~ SOLN
40.0000 mg | SUBCUTANEOUS | Status: DC
  Administered 2019-01-27: 20:00:00 40 mg via SUBCUTANEOUS
  Filled 2019-01-27: qty 0.4

## 2019-01-27 MED ORDER — ACETAMINOPHEN 650 MG RE SUPP: 650.0000 mg | RECTAL | Status: DC | PRN

## 2019-01-27 MED ORDER — ACETAMINOPHEN 325 MG PO TABS
650.0000 mg | ORAL_TABLET | ORAL | Status: DC | PRN
  Administered 2019-01-28: 650 mg via ORAL
  Filled 2019-01-27: qty 2

## 2019-01-27 NOTE — ED Notes (Signed)
Pt up to bedside toilet with NT.

## 2019-01-27 NOTE — ED Notes (Signed)
CODE STROKE CALLED TO [id number] 

## 2019-01-27 NOTE — ED Notes (Signed)
Pt/daughter briefly updated.

## 2019-01-27 NOTE — ED Notes (Signed)
FIRST NURSE NOTE-pt with slurred speech and difficulty walking. Pulled next for triage. Present when waking this AM

## 2019-01-27 NOTE — ED Triage Notes (Signed)
States went to bed normal at 10pm. This am slurred speech and L weakness.

## 2019-01-27 NOTE — ED Notes (Signed)
Called lab about covid test; per lab this test is the 48hr not rapid. Floor stated this result must be back first; this RN will call to notify pt being sent up to room at 19:30 since it is not a rapid.

## 2019-01-27 NOTE — Progress Notes (Signed)
Pt arrived to the unit with daughter.  Dtr brought [employer] for pt.  Explained that I needed to place pt on monitor and check orders for diet and swallow eval.  Pt started eating before able to check orders.  Delayed assessment, placing  Monitor, profile due to pt wanting to eat.  Asked pt to press call light when willing to start admission process. Dorna Bloom RN

## 2019-01-27 NOTE — Progress Notes (Signed)
CODE STROKE- PHARMACY COMMUNICATION   Time CODE STROKE called/page received:'@242'$  pm   Time response to CODE STROKE was made (in person or via phone): in-person  Time Stroke Kit retrieved from [facility] (only if needed): retrieved but not indicated given pt was outside of window.   Name of Provider/Nurse contacted: Minette Brine RN   Past Medical History:  Diagnosis Date  . Arthritis   . Basal cell carcinoma (BCC) of right cheek 01/03/2016  . Change in hair   . Complication of anesthesia    "takes a little longer to wake up"   . DDD (degenerative disc disease), cervical   . Discolored nails    TOENAILS  . HBP (high blood pressure)   . History of dysplastic nevus 03/13/2013  . Hypertension   . Muscle pain   . PONV (postoperative nausea and vomiting)    nausea  . Shortness of breath dyspnea    with exertion  . Stroke [facility]) 2006   tia  . TIA (transient ischemic attack)    Prior to Admission medications   Medication Sig Start Date End Date Taking? Authorizing Provider  aspirin 81 MG tablet Take 81 mg by mouth daily.    [provider]  Calcium Carbonate-Vitamin D (CALCIUM 600 + D PO) Take 1 tablet by mouth daily.     [provider]  hydrochlorothiazide (HYDRODIURIL) 25 MG tablet Take 25 mg by mouth daily.    [provider]  meloxicam (MOBIC) 15 MG tablet Take 1 tablet (15 mg total) by mouth daily. 06/16/15   Jule Ser, DO  omega-3 acid ethyl esters (LOVAZA) 1 g capsule Take 1 g by mouth daily.    [provider]    Rowland Lathe ,PharmD Clinical Pharmacist  01/27/2019  3:05 PM

## 2019-01-27 NOTE — ED Provider Notes (Signed)
[facility] Emergency Department Provider Note  ____________________________________________  Time seen: Approximately 3:23 PM  I have reviewed the triage vital signs and the nursing notes.   HISTORY  Chief Complaint Code Stroke    HPI Kristina Banks is a 78 y.o. female with a history of basal cell carcinoma hypertension and prior stroke who comes the ED due to slurred speech and difficulty walking and left-sided weakness first noticed at about 8:00 AM today when her daughter called her on the phone.  Last known well is bedtime last night at 10 PM.  Daughter at bedside reports that the slurred speech and difficulty walking are still present and that her mother is not at her baseline.  Patient denies any acute pain or other complaints, no fall or head injury.  No reported seizure or convulsive activity.  Symptoms are constant, no aggravating or alleviating factors.      Past Medical History:  Diagnosis Date  . Arthritis   . Basal cell carcinoma (BCC) of right cheek 01/03/2016  . Change in hair   . Complication of anesthesia    "takes a little longer to wake up"   . DDD (degenerative disc disease), cervical   . Discolored nails    TOENAILS  . HBP (high blood pressure)   . History of dysplastic nevus 03/13/2013  . Hypertension   . Muscle pain   . PONV (postoperative nausea and vomiting)    nausea  . Shortness of breath dyspnea    with exertion  . Stroke [facility]) 2006   tia  . TIA (transient ischemic attack)      Patient Active Problem List   Diagnosis Date Noted  . Lumbar herniated disc 07/07/2015  . Back pain   . Hip pain   . LLQ pain   . Radiculopathy, lumbar region   . Abdominal pain 06/14/2015  . HTN (hypertension) 06/14/2015  . 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 Post-op pain 02/18/2013  . Contusion of foot 02/18/2013     Past Surgical History:  Procedure Laterality Date  . ABDOMINAL  HYSTERECTOMY    . ARM SURGERY Left    BONE TUMOR  . ARTHOSCOPIC ROTAOR CUFF REPAIR Left 12/16/2014   Procedure: ARTHROSCOPIC ROTATOR CUFF REPAIR;  Surgeon: Earnestine Leys, MD;  Location: [facility] ORS;  Service: Orthopedics;  Laterality: Left;  . bone tumor removed     Lt arm  . CATARACT EXTRACTION W/PHACO Right 11/01/2015   Procedure: CATARACT EXTRACTION PHACO AND INTRAOCULAR LENS PLACEMENT (IOC);  Surgeon: Estill Cotta, MD;  Location: [facility] ORS;  Service: Ophthalmology;  Laterality: Right;  [place] 01:18 AP% 22.8 CDE 36.00 fluid pack lot # [id number]  . CATARACT EXTRACTION W/PHACO Left 12/22/2015   Procedure: CATARACT EXTRACTION PHACO AND INTRAOCULAR LENS PLACEMENT (IOC);  Surgeon: Estill Cotta, MD;  Location: [facility] ORS;  Service: Ophthalmology;  Laterality: Left;  [place] 00:51 AP% 23.4 CDE 22.40 Fluid pack lot # [id number]  . COLONOSCOPY    . COLONOSCOPY WITH PROPOFOL N/A 04/18/2017   Procedure: COLONOSCOPY WITH PROPOFOL;  Surgeon: Manya Silvas, MD;  Location: [facility] ENDOSCOPY;  Service: Endoscopy;  Laterality: N/A;  . FOOT SURGERY Right    10.3.14  . FRACTURE SURGERY    . fractured knee Left   . LUMBAR LAMINECTOMY/DECOMPRESSION MICRODISCECTOMY Left 07/07/2015   Procedure: Left Lumbar two-three Extraforaminal Microdiskectomy;  Surgeon: Leeroy Cha, MD;  Location: [place] NEURO ORS;  Service: Neurosurgery;  Laterality: Left;  Left L2-3 Extraforaminal Microdiskectomy  .  TONSILLECTOMY       Prior to Admission medications   Medication Sig Start Date End Date Taking? Authorizing Provider  amLODipine (NORVASC) 5 MG tablet Take 5 mg by mouth daily. 12/30/18  Yes [provider]  aspirin 81 MG tablet Take 81 mg by mouth daily.   Yes [provider]  Calcium Carbonate-Vitamin D (CALCIUM 600 + D PO) Take 1 tablet by mouth daily.    Yes [provider]  hydrochlorothiazide (HYDRODIURIL) 12.5 MG tablet Take 12.5 mg by mouth daily.    Yes [provider]  meloxicam (MOBIC)  15 MG tablet Take 1 tablet (15 mg total) by mouth daily. 06/16/15  Yes Jule Ser, DO  olmesartan (BENICAR) 20 MG tablet Take 20 mg by mouth daily. 12/30/18  Yes [provider]     Allergies Codeine, Cephalexin, and Oxycodone   No family history on file.  Social History Social History   Tobacco Use  . Smoking status: Never Smoker  . Smokeless tobacco: Never Used  Substance Use Topics  . Alcohol use: No  . Drug use: No    Review of Systems  Constitutional:   No fever or chills.  ENT:   No sore throat. No rhinorrhea. Cardiovascular:   No chest pain or syncope. Respiratory:   No dyspnea or cough. Gastrointestinal:   Negative for abdominal pain, vomiting and diarrhea.  Musculoskeletal:   Negative for focal pain or swelling All other systems reviewed and are negative except as documented above in ROS and HPI.  ____________________________________________   PHYSICAL EXAM:  VITAL SIGNS: ED Triage Vitals  Enc Vitals Group     BP 01/27/19 1427 (!) 154/61     Pulse Rate 01/27/19 1427 86     Resp 01/27/19 1427 20     Temp 01/27/19 1427 98.1 F (36.7 C)     Temp Source 01/27/19 1427 Oral     SpO2 01/27/19 1427 90 %     Weight 01/27/19 1437 135 lb (61.2 kg)     Height 01/27/19 1437 5\' 5"  (1.651 m)     Head Circumference --      Peak Flow --      Pain Score 01/27/19 1437 3     Pain Loc --      Pain Edu? --      Excl. in [place]? --     Vital signs reviewed, nursing assessments reviewed.   Constitutional:   Alert and oriented. Non-toxic appearance. Eyes:   Conjunctivae are normal. EOMI. PERRL. ENT      Head:   Normocephalic and atraumatic.      Nose:   No congestion/rhinnorhea.       Mouth/Throat:   MMM, no pharyngeal erythema. No peritonsillar mass.       Neck:   No meningismus. Full ROM. Hematological/Lymphatic/Immunilogical:   No cervical lymphadenopathy. Cardiovascular:   RRR. Symmetric bilateral radial and DP pulses.  No murmurs. Cap refill less than 2  seconds. Respiratory:   Normal respiratory effort without tachypnea/retractions. Breath sounds are clear and equal bilaterally. No wheezes/rales/rhonchi. Gastrointestinal:   Soft and nontender. Non distended. There is no CVA tenderness.  No rebound, rigidity, or guarding.  Musculoskeletal:   Normal range of motion in all extremities. No joint effusions.  No lower extremity tenderness.  No edema. Neurologic:   Dysarthric speech.  Normal language perception and expression Cranial nerves III through XII intact No pronator drift, normal finger-to-nose, normal heel-to-shin.  Motor grossly intact. Shuffling gait, maintains balance NIH  stroke scale 2 Skin:    Skin is warm, dry and intact. No rash noted.  No petechiae, purpura, or bullae.  ____________________________________________    LABS (pertinent positives/negatives) (all labs ordered are listed, but only abnormal results are displayed) Labs Reviewed  CBC - Abnormal; Notable for the following components:      Result Value   WBC 15.0 (*)    All other components within normal limits  DIFFERENTIAL - Abnormal; Notable for the following components:   Neutro Abs 11.5 (*)    Abs Immature Granulocytes 0.15 (*)    All other components within normal limits  COMPREHENSIVE METABOLIC PANEL - Abnormal; Notable for the following components:   Glucose, Bld 151 (*)    BUN 29 (*)    All other components within normal limits  GLUCOSE, CAPILLARY - Abnormal; Notable for the following components:   Glucose-Capillary 149 (*)    All other components within normal limits  SARS CORONAVIRUS 2 (TAT 6-24 HRS)  PROTIME-INR  APTT  I-STAT CREATININE, ED  CBG MONITORING, ED   ____________________________________________   EKG  Interpreted by me Normal sinus rhythm rate of 78, normal axis and intervals.  Right bundle branch block.  No acute ischemic changes.  ____________________________________________    RADIOLOGY  Ct Head Code Stroke Wo  Contrast  Result Date: 01/27/2019 CLINICAL DATA:  Code stroke. Left-sided weakness and slurred speech beginning today. Symptoms were present upon waking. Last seen normal at 10 p.m. last night. EXAM: CT HEAD WITHOUT CONTRAST TECHNIQUE: Contiguous axial images were obtained from the base of the skull through the vertex without intravenous contrast. COMPARISON:  CT head without contrast 08/12/2010. MR head without contrast 11/02/2014 and MR face 09/27/2018 FINDINGS: Brain: Extensive periventricular and subcortical white matter changes bilaterally are similar to previous studies. No acute infarct, hemorrhage, or mass lesion is present. Basal ganglia are intact. Insular ribbon is normal. Gray-white differentiation is preserved. No acute or focal cortical lesions are evident. Vascular: Atherosclerotic calcifications are present within the cavernous internal carotid arteries bilaterally. There is no hyperdense vessel. Skull: Calvarium is intact. No focal lytic or blastic lesions are present. Sinuses/Orbits: The paranasal sinuses and mastoid air cells are clear. Bilateral lens replacements are noted. Globes and orbits are otherwise unremarkable. ASPECTS [facility] Stroke Program Early CT Score) - Ganglionic level infarction (caudate, lentiform nuclei, internal capsule, insula, M1-M3 cortex): 7/7 - Supraganglionic infarction (M4-M6 cortex): 3/3 Total score (0-10 with 10 being normal): 10/10 IMPRESSION: 1. No acute intracranial abnormality. 2. Stable appearance of chronic atrophy and white matter disease likely reflecting the sequela of chronic microvascular ischemia. 3. ASPECTS is 10/10 These results were called by telephone at the time of interpretation on 01/27/2019 at 2:50 pm to provider Kidada Ging, MD , who verbally acknowledged these results. Electronically Signed   By: San Morelle M.D.   On: 01/27/2019 14:51    ____________________________________________   PROCEDURES .Critical Care Performed by:  Carrie Mew, MD Authorized by: Carrie Mew, MD   Critical care provider statement:    Critical care time (minutes):  35   Critical care time was exclusive of:  Separately billable procedures and treating other patients   Critical care was necessary to treat or prevent imminent or life-threatening deterioration of the following conditions:  CNS failure or compromise   Critical care was time spent personally by me on the following activities:  Development of treatment plan with patient or surrogate, discussions with consultants, evaluation of patient's response to treatment, examination of patient, obtaining history  from patient or surrogate, ordering and performing treatments and interventions, ordering and review of laboratory studies, ordering and review of radiographic studies, pulse oximetry, re-evaluation of patient's condition and review of old charts    ____________________________________________  DIFFERENTIAL DIAGNOSIS   Intracranial hemorrhage, acute ischemic stroke  CLINICAL IMPRESSION / ASSESSMENT AND PLAN / ED COURSE  Medications ordered in the ED: Medications  sodium chloride flush (NS) 0.9 % injection 3 mL (has no administration in time range)  aspirin chewable tablet 324 mg (has no administration in time range)  iohexol (OMNIPAQUE) 350 MG/ML injection 75 mL (75 mLs Intravenous Contrast Given 01/27/19 1454)    Pertinent labs & imaging results that were available during my care of the patient were reviewed by me and considered in my medical decision making (see chart for details).  Kristina Banks was evaluated in Emergency Department on 01/27/2019 for the symptoms described in the history of present illness. She was evaluated in the context of the global COVID-19 pandemic, which necessitated consideration that the patient might be at risk for infection with the SARS-CoV-2 virus that causes COVID-19. Institutional protocols and algorithms that pertain to the  evaluation of patients at risk for COVID-19 are in a state of rapid change based on information released by regulatory bodies including the CDC and federal and state organizations. These policies and algorithms were followed during the patient's care in the ED.   Patient presents with reported left facial droop and left-sided weakness and unsteady gait first noticed this morning at about 8:00 AM, unclear last known well.  CT head negative, result discussed with the radiologist.  Case reviewed with the neurologist after his in person evaluation as well.  CT angiogram obtained which is negative.  Neurology recommends against TPA, advises patient should be hospitalized for further stroke work-up.  We will give a full dose aspirin.  Hospitalist paged for further management.     ____________________________________________   FINAL CLINICAL IMPRESSION(S) / ED DIAGNOSES    Final diagnoses:  Acute ischemic stroke [facility])  Left-sided weakness     ED Discharge Orders    None      Portions of this note were generated with dragon dictation software. Dictation errors may occur despite best attempts at proofreading.   Carrie Mew, MD 01/27/19 1538

## 2019-01-27 NOTE — Consult Note (Signed)
Admission H&P    Chief Complaint: slurred speech, left sided wekaness HPI: Kristina Banks is an 78 y.o. female with HTN presents to ER as code stroke with difficulty speaking and left sided wekaness  LSN: 800 am  Per daughter at bedside: daughter last saw her mon yesterday evening. This AM around 800 when she called her she noticed that he speech was 'not OK. Around 1100 am when she met her daughter noticed her to be dragging her left leg. Patient brought to ER. Head ct w/o acute abnlities. NIHS of 3. CTA w/o LVO ( discussed with dr Rowe Clack neuro radiologist). Patient not candidate for tpa due to outside window. Not candidate for IR due to no Lvo   Past Medical History:  Diagnosis Date  . Arthritis   . Basal cell carcinoma (BCC) of right cheek 01/03/2016  . Change in hair   . Complication of anesthesia    "takes a little longer to wake up"   . DDD (degenerative disc disease), cervical   . Discolored nails    TOENAILS  . HBP (high blood pressure)   . History of dysplastic nevus 03/13/2013  . Hypertension   . Muscle pain   . PONV (postoperative nausea and vomiting)    nausea  . Shortness of breath dyspnea    with exertion  . Stroke [facility]) 2006   tia  . TIA (transient ischemic attack)     Past Surgical History:  Procedure Laterality Date  . ABDOMINAL HYSTERECTOMY    . ARM SURGERY Left    BONE TUMOR  . ARTHOSCOPIC ROTAOR CUFF REPAIR Left 12/16/2014   Procedure: ARTHROSCOPIC ROTATOR CUFF REPAIR;  Surgeon: Earnestine Leys, MD;  Location: [facility] ORS;  Service: Orthopedics;  Laterality: Left;  . bone tumor removed     Lt arm  . CATARACT EXTRACTION W/PHACO Right 11/01/2015   Procedure: CATARACT EXTRACTION PHACO AND INTRAOCULAR LENS PLACEMENT (IOC);  Surgeon: Estill Cotta, MD;  Location: [facility] ORS;  Service: Ophthalmology;  Laterality: Right;  [place] 01:18 AP% 22.8 CDE 36.00 fluid pack lot # [id number]  . CATARACT EXTRACTION W/PHACO Left 12/22/2015   Procedure: CATARACT EXTRACTION PHACO AND  INTRAOCULAR LENS PLACEMENT (IOC);  Surgeon: Estill Cotta, MD;  Location: [facility] ORS;  Service: Ophthalmology;  Laterality: Left;  [place] 00:51 AP% 23.4 CDE 22.40 Fluid pack lot # [id number]  . COLONOSCOPY    . COLONOSCOPY WITH PROPOFOL N/A 04/18/2017   Procedure: COLONOSCOPY WITH PROPOFOL;  Surgeon: Manya Silvas, MD;  Location: [facility] ENDOSCOPY;  Service: Endoscopy;  Laterality: N/A;  . FOOT SURGERY Right    10.3.14  . FRACTURE SURGERY    . fractured knee Left   . LUMBAR LAMINECTOMY/DECOMPRESSION MICRODISCECTOMY Left 07/07/2015   Procedure: Left Lumbar two-three Extraforaminal Microdiskectomy;  Surgeon: Leeroy Cha, MD;  Location: [place] NEURO ORS;  Service: Neurosurgery;  Laterality: Left;  Left L2-3 Extraforaminal Microdiskectomy  . TONSILLECTOMY      No family history on file. Social History:  reports that she has never smoked. She has never used smokeless tobacco. She reports that she does not drink alcohol or use drugs.  Allergies:  Allergies  Allergen Reactions  . Codeine Nausea And Vomiting and Other (See Comments)    Hallucinations  . Cephalexin Other (See Comments)    Dizziness, PATIENT DENIES  . Oxycodone Other (See Comments)    Dizzy    (Not in a hospital admission)   ROS: As hpi  Physical Examination: Blood pressure (!) 168/77, pulse 78, temperature 98.1 F (36.7  C), temperature source Oral, resp. rate 17, height _0  (1.651 m), weight 61.2 kg, SpO2 98 %.  HEENT-  Normocephalic, no lesions, without obvious abnormality.  Normal external eye and conjunctiva.  Normal TM's bilaterally.  Normal auditory canals and external ears. Normal external nose, mucus membranes and septum.  Normal pharynx. Neck supple with no masses, nodes, nodules or enlargement. Cardiovascular - regular rate and rhythm, S1, S2 normal, no murmur, click, rub or gallop Lungs - chest clear, no wheezing, rales, normal symmetric air entry, Heart exam - S1, S2 normal, no murmur, no gallop, rate  regular Abdomen - soft, non-tender; bowel sounds normal; no masses,  no organomegaly Extremities - no joint deformities, effusion, or inflammation  Neurologic Examination: Alert, awake, oriented x3, some slurred speech, following commands PERLA, EOMI, no nystagmus, VFF, flattening of left nasol labial fold, face sensation nle, uvula/tongue midline Motor: she had drift on LUEX/LLEX, 4/5 on LUEX/LLEX, 5/5 on RUEX/RLEX No sensory deficit appreciated No coordination deficit appreciated dtr and gait not checked at time of examination    Results for orders placed or performed during the hospital encounter of 01/27/19 (from the past 48 hour(s))  Glucose, capillary     Status: Abnormal   Collection Time: 01/27/19  2:38 PM  Result Value Ref Range   Glucose-Capillary 149 (H) 70 - 99 mg/dL  Protime-INR     Status: None   Collection Time: 01/27/19  2:44 PM  Result Value Ref Range   Prothrombin Time 11.8 11.4 - 15.2 seconds   INR 0.9 0.8 - 1.2    Comment: (NOTE) INR goal varies based on device and disease states. Performed at [facility], [address] [place], [place] [address]   APTT     Status: None   Collection Time: 01/27/19  2:44 PM  Result Value Ref Range   aPTT 24 24 - 36 seconds    Comment: Performed at [facility], [address] [place], [place] [address]  CBC     Status: Abnormal   Collection Time: 01/27/19  2:44 PM  Result Value Ref Range   WBC 15.0 (H) 4.0 - 10.5 K/uL   RBC 4.69 3.87 - 5.11 MIL/uL   Hemoglobin 14.5 12.0 - 15.0 g/dL   HCT 42.9 36.0 - 46.0 %   MCV 91.5 80.0 - 100.0 fL   MCH 30.9 26.0 - 34.0 pg   MCHC 33.8 30.0 - 36.0 g/dL   RDW 12.6 11.5 - 15.5 %   Platelets 291 150 - 400 K/uL   nRBC 0.0 0.0 - 0.2 %    Comment: Performed at [facility], [address] [place], [place] [address]  Differential     Status: Abnormal   Collection Time: 01/27/19  2:44 PM  Result Value Ref Range   Neutrophils Relative % 77 %   Neutro  Abs 11.5 (H) 1.7 - 7.7 K/uL   Lymphocytes Relative 15 %   Lymphs Abs 2.3 0.7 - 4.0 K/uL   Monocytes Relative 6 %   Monocytes Absolute 0.9 0.1 - 1.0 K/uL   Eosinophils Relative 1 %   Eosinophils Absolute 0.1 0.0 - 0.5 K/uL   Basophils Relative 0 %   Basophils Absolute 0.0 0.0 - 0.1 K/uL   Immature Granulocytes 1 %   Abs Immature Granulocytes 0.15 (H) 0.00 - 0.07 K/uL    Comment: Performed at [facility], [address] [place],  [address]  Comprehensive metabolic panel     Status: Abnormal  Collection Time: 01/27/19  2:44 PM  Result Value Ref Range   Sodium 137 135 - 145 mmol/L   Potassium 3.7 3.5 - 5.1 mmol/L   Chloride 105 98 - 111 mmol/L   CO2 22 22 - 32 mmol/L   Glucose, Bld 151 (H) 70 - 99 mg/dL   BUN 29 (H) 8 - 23 mg/dL   Creatinine, Ser 0.82 0.44 - 1.00 mg/dL   Calcium 9.6 8.9 - 10.3 mg/dL   Total Protein 6.9 6.5 - 8.1 g/dL   Albumin 4.1 3.5 - 5.0 g/dL   AST 21 15 - 41 U/L   ALT 20 0 - 44 U/L   Alkaline Phosphatase 41 38 - 126 U/L   Total Bilirubin 0.7 0.3 - 1.2 mg/dL   GFR calc non Af Amer >60 >60 mL/min   GFR calc Af Amer >60 >60 mL/min   Anion gap 10 5 - 15    Comment: Performed at [facility], [address] [place], [place] [address]   Ct Head Code Stroke Wo Contrast  Result Date: 01/27/2019 CLINICAL DATA:  Code stroke. Left-sided weakness and slurred speech beginning today. Symptoms were present upon waking. Last seen normal at 10 p.m. last night. EXAM: CT HEAD WITHOUT CONTRAST TECHNIQUE: Contiguous axial images were obtained from the base of the skull through the vertex without intravenous contrast. COMPARISON:  CT head without contrast 08/12/2010. MR head without contrast 11/02/2014 and MR face 09/27/2018 FINDINGS: Brain: Extensive periventricular and subcortical white matter changes bilaterally are similar to previous studies. No acute infarct, hemorrhage, or mass lesion is present. Basal ganglia are intact. Insular ribbon is normal.  Gray-white differentiation is preserved. No acute or focal cortical lesions are evident. Vascular: Atherosclerotic calcifications are present within the cavernous internal carotid arteries bilaterally. There is no hyperdense vessel. Skull: Calvarium is intact. No focal lytic or blastic lesions are present. Sinuses/Orbits: The paranasal sinuses and mastoid air cells are clear. Bilateral lens replacements are noted. Globes and orbits are otherwise unremarkable. ASPECTS [facility] Stroke Program Early CT Score) - Ganglionic level infarction (caudate, lentiform nuclei, internal capsule, insula, M1-M3 cortex): 7/7 - Supraganglionic infarction (M4-M6 cortex): 3/3 Total score (0-10 with 10 being normal): 10/10 IMPRESSION: 1. No acute intracranial abnormality. 2. Stable appearance of chronic atrophy and white matter disease likely reflecting the sequela of chronic microvascular ischemia. 3. ASPECTS is 10/10 These results were called by telephone at the time of interpretation on 01/27/2019 at 2:50 pm to provider PHILLIP STAFFORD, MD , who verbally acknowledged these results. Electronically Signed   By: San Morelle M.D.   On: 01/27/2019 14:51    Assessment: 78 y.o. female with htn presenst to er with speech difficulty and left sided weakness. NIHS 3, head CT w/o acute ablites, CTA with no LVO. Not candidate for tpa nor IR Plan: 1. HgbA1c, fasting lipid panel 2. MRI, MRA  of the brain without contrast 3. PT consult, OT consult, Speech consult 4. Echocardiogram 5. Carotid dopplers 6. Prophylactic therapy ASA 325 if no CI 7. Risk factor modification 8. Telemetry monitoring 9. Frequent neuro checks

## 2019-01-27 NOTE — H&P (Signed)
Kristina Banks at [place] NAME: Kristina Banks    MR#:  [id number]  DATE OF BIRTH:  1940/06/28  DATE OF ADMISSION:  01/27/2019  PRIMARY CARE PHYSICIAN: Rusty Aus, MD   REQUESTING/REFERRING PHYSICIAN: dr Joni Fears  CHIEF COMPLAINT:   Slurred speech HISTORY OF PRESENT ILLNESS:  Kristina Banks  is a 78 y.o. female with a known history of hypertension who presents today to the emergency room due to difficulty speaking and left-sided weakness.  Code stroke was called in the ER.  Neurologist has seen the patient who is recommending further work-up for CVA.  Head CT without acute abnormalities.  CT of the neck with results below.  Patient is not a candidate for TPA due to patient being outside the window.  Apparently daughter saw the patient on Monday evening and she was in her normal state of health.  This a.m. when the daughter called her she noticed that the speech was abnormal.  At around 11:00 her daughter noticed patient dragging her left leg.  She was brought to ER for further evaluation.  PAST MEDICAL HISTORY:   Past Medical History:  Diagnosis Date  . Arthritis   . Basal cell carcinoma (BCC) of right cheek 01/03/2016  . Change in hair   . Complication of anesthesia    "takes a little longer to wake up"   . DDD (degenerative disc disease), cervical   . Discolored nails    TOENAILS  . HBP (high blood pressure)   . History of dysplastic nevus 03/13/2013  . Hypertension   . Muscle pain   . PONV (postoperative nausea and vomiting)    nausea  . Shortness of breath dyspnea    with exertion  . Stroke [facility] 2006   tia  . TIA (transient ischemic attack)     PAST SURGICAL HISTORY:   Past Surgical History:  Procedure Laterality Date  . ABDOMINAL HYSTERECTOMY    . ARM SURGERY Left    BONE TUMOR  . ARTHOSCOPIC ROTAOR CUFF REPAIR Left 12/16/2014   Procedure: ARTHROSCOPIC ROTATOR CUFF REPAIR;  Surgeon: Earnestine Leys, MD;  Location: [facility] ORS;   Service: Orthopedics;  Laterality: Left;  . bone tumor removed     Lt arm  . CATARACT EXTRACTION W/PHACO Right 11/01/2015   Procedure: CATARACT EXTRACTION PHACO AND INTRAOCULAR LENS PLACEMENT (IOC);  Surgeon: Estill Cotta, MD;  Location: [facility] ORS;  Service: Ophthalmology;  Laterality: Right;  [place] 01:18 AP% 22.8 CDE 36.00 fluid pack lot # [id number]  . CATARACT EXTRACTION W/PHACO Left 12/22/2015   Procedure: CATARACT EXTRACTION PHACO AND INTRAOCULAR LENS PLACEMENT (IOC);  Surgeon: Estill Cotta, MD;  Location: [facility] ORS;  Service: Ophthalmology;  Laterality: Left;  [place] 00:51 AP% 23.4 CDE 22.40 Fluid pack lot # [id number]  . COLONOSCOPY    . COLONOSCOPY WITH PROPOFOL N/A 04/18/2017   Procedure: COLONOSCOPY WITH PROPOFOL;  Surgeon: Manya Silvas, MD;  [facility] ENDOSCOPY;  Service: Endoscopy;  Laterality: N/A;  . FOOT SURGERY Right    10.3.14  . FRACTURE SURGERY    . fractured knee Left   . LUMBAR LAMINECTOMY/DECOMPRESSION MICRODISCECTOMY Left 07/07/2015   Procedure: Left Lumbar two-three Extraforaminal Microdiskectomy;  Surgeon: Leeroy Cha, MD;  Location: [place] NEURO ORS;  Service: Neurosurgery;  Laterality: Left;  Left L2-3 Extraforaminal Microdiskectomy  . TONSILLECTOMY      SOCIAL HISTORY:   Social History   Tobacco Use  . Smoking status: Never Smoker  . Smokeless tobacco: Never Used  Substance  Use Topics  . Alcohol use: No    FAMILY HISTORY:  No family history on file.  DRUG ALLERGIES:   Allergies  Allergen Reactions  . Codeine Nausea And Vomiting and Other (See Comments)    Hallucinations  . Cephalexin Other (See Comments)    Dizziness, PATIENT DENIES  . Oxycodone Other (See Comments)    Dizzy    REVIEW OF SYSTEMS:   Review of Systems  Constitutional: Negative.  Negative for chills, fever and malaise/fatigue.  HENT: Negative.  Negative for ear discharge, ear pain, hearing loss, nosebleeds and sore throat.   Eyes: Negative.  Negative for blurred vision  and pain.  Respiratory: Negative.  Negative for cough, hemoptysis, shortness of breath and wheezing.   Cardiovascular: Negative.  Negative for chest pain, palpitations and leg swelling.  Gastrointestinal: Negative.  Negative for abdominal pain, blood in stool, diarrhea, nausea and vomiting.  Genitourinary: Negative.  Negative for dysuria.  Musculoskeletal: Negative.  Negative for back pain.  Skin: Negative.   Neurological: Positive for speech change, focal weakness, weakness and headaches. Negative for dizziness, tremors and seizures.  Endo/Heme/Allergies: Negative.  Does not bruise/bleed easily.  Psychiatric/Behavioral: Negative.  Negative for depression, hallucinations and suicidal ideas.    MEDICATIONS AT HOME:   Prior to Admission medications   Medication Sig Start Date End Date Taking? Authorizing Provider  amLODipine (NORVASC) 5 MG tablet Take 5 mg by mouth daily. 12/30/18  Yes [provider]  aspirin 81 MG tablet Take 81 mg by mouth daily.   Yes [provider]  Calcium Carbonate-Vitamin D (CALCIUM 600 + D PO) Take 1 tablet by mouth daily.    Yes [provider]  hydrochlorothiazide (HYDRODIURIL) 12.5 MG tablet Take 12.5 mg by mouth daily.    Yes [provider]  meloxicam (MOBIC) 15 MG tablet Take 1 tablet (15 mg total) by mouth daily. 06/16/15  Yes Jule Ser, DO  olmesartan (BENICAR) 20 MG tablet Take 20 mg by mouth daily. 12/30/18  Yes [provider]      VITAL SIGNS:  Blood pressure (!) 168/77, pulse 78, temperature 98.1 F (36.7 C), temperature source Oral, resp. rate 17, height 5\' 5"  (1.651 m), weight 61.2 kg, SpO2 98 %.  PHYSICAL EXAMINATION:   Physical Exam Constitutional:      General: She is not in acute distress. HENT:     Head: Normocephalic.  Eyes:     General: No scleral icterus. Neck:     Musculoskeletal: Normal range of motion and neck supple.     Vascular: No JVD.     Trachea: No tracheal deviation.   Cardiovascular:     Rate and Rhythm: Normal rate and regular rhythm.     Heart sounds: Normal heart sounds. No murmur. No friction rub. No gallop.   Pulmonary:     Effort: Pulmonary effort is normal. No respiratory distress.     Breath sounds: Normal breath sounds. No wheezing or rales.  Chest:     Chest wall: No tenderness.  Abdominal:     General: Bowel sounds are normal. There is no distension.     Palpations: Abdomen is soft. There is no mass.     Tenderness: There is no abdominal tenderness. There is no guarding or rebound.  Musculoskeletal: Normal range of motion.  Skin:    General: Skin is warm.     Findings: No erythema or rash.  Neurological:     Mental Status: She is alert and oriented to person, place, and  time.     Sensory: Sensory deficit present.     Motor: Weakness present.     Coordination: Coordination abnormal.  Psychiatric:        Judgment: Judgment normal.       LABORATORY PANEL:   CBC Recent Labs  Lab 01/27/19 1444  WBC 15.0*  HGB 14.5  HCT 42.9  PLT 291   ------------------------------------------------------------------------------------------------------------------  Chemistries  Recent Labs  Lab 01/27/19 1444  NA 137  K 3.7  CL 105  CO2 22  GLUCOSE 151*  BUN 29*  CREATININE 0.82  CALCIUM 9.6  AST 21  ALT 20  ALKPHOS 41  BILITOT 0.7   ------------------------------------------------------------------------------------------------------------------  Cardiac Enzymes No results for input(s): TROPONINI in the last 168 hours. ------------------------------------------------------------------------------------------------------------------  RADIOLOGY:  Ct Code Stroke Cta Head W/wo Contrast  Result Date: 01/27/2019 CLINICAL DATA:  New onset of slurred speech and left-sided weakness upon waking today. Last seen normal at 10 o'clock last night. EXAM: CT ANGIOGRAPHY HEAD AND NECK TECHNIQUE: Multidetector CT imaging of the head and  neck was performed using the standard protocol during bolus administration of intravenous contrast. Multiplanar CT image reconstructions and MIPs were obtained to evaluate the vascular anatomy. Carotid stenosis measurements (when applicable) are obtained utilizing NASCET criteria, using the distal internal carotid diameter as the denominator. CONTRAST:  8mL OMNIPAQUE IOHEXOL 350 MG/ML SOLN COMPARISON:  CT head without contrast 01/27/2019. MR face without and with contrast 09/27/2018. MR head without contrast 11/02/2014. FINDINGS: CTA NECK FINDINGS Aortic arch: A 3 vessel arch configuration is present. Atherosclerotic changes are present at the arch and at the origins the great vessels without significant stenosis. There is no aneurysm. Right carotid system: The right common carotid artery is within normal limits. Atherosclerotic calcifications are present at the right carotid bifurcation without a significant stenosis. Cervical right ICA is normal. Left carotid system: The left common carotid artery is within normal limits. Atherosclerotic calcifications are present at the left carotid bifurcation the lumen of the left ICA is narrowed 1.5 mm of over the course of 10 mm. The more distal cervical ICA is symmetric. Vertebral arteries: The vertebral arteries originate from the subclavian arteries bilaterally without significant stenosis. The right vertebral artery is slightly dominant left. There is no significant stenosis of either vertebral artery in the neck. Skeleton: Cervical spine is fused anteriorly at C4-5 and C5-6. There is slight listhesis above and below the fusion. No focal lytic or blastic lesions are present. Other neck: Bilateral subcentimeter thyroid nodules are present. There is no dominant nodule. No follow-up is necessary. Soft tissues the neck are otherwise unremarkable. Upper chest: The lung apices are clear.  Thoracic inlet is normal. Review of the MIP images confirms the above findings CTA HEAD  FINDINGS Anterior circulation: Atherosclerotic calcifications present within the cavernous left internal carotid artery. The internal carotid arteries are otherwise within normal limits from the skull base through the ICA termini bilaterally. The A1 and M1 segments are normal. There is mild narrowing of less than 50% in the proximal right M1 segment. Left M1 segment is within normal limits to the bifurcation. The right A1 segment is dominant. The anterior communicating artery is patent. There is segmental irregularity within the distal ACA and MCA branch vessels bilaterally without a significant proximal stenosis or occlusion. Posterior circulation: The right vertebral artery is dominant. PICA origins are visualized and normal. Basilar artery is normal. There is a high-grade stenosis of proximal left P1 segment. Moderate right P2 segment stenosis is present. Segmental  irregularity is present within the distal PCA branches bilaterally. Venous sinuses: The dural sinuses are patent. The straight sinus deep cerebral veins are intact. Cortical veins are unremarkable. Anatomic variants: None Review of the MIP images confirms the above findings IMPRESSION: 1. Proximal right M1 segment stenosis of less than 50% relative to the more distal vessel. 2. No emergent large vessel occlusion. 3. High-grade stenoses of the proximal left P1 segment and more distal right P2 segment. 4. Diffuse distal small vessel disease throughout the circle-of-Willis. 5. Atherosclerotic changes at the aortic arch right carotid artery bifurcation without significant stenosis. 6. 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 Electronically Signed   By: San Morelle M.D.   On: 01/27/2019 15:37   Ct Code Stroke Cta Neck W/wo Contrast  Result Date: 01/27/2019 CLINICAL DATA:  New onset of slurred speech and left-sided weakness upon waking today. Last seen normal at 10 o'clock last night. EXAM: CT ANGIOGRAPHY HEAD AND NECK TECHNIQUE:  Multidetector CT imaging of the head and neck was performed using the standard protocol during bolus administration of intravenous contrast. Multiplanar CT image reconstructions and MIPs were obtained to evaluate the vascular anatomy. Carotid stenosis measurements (when applicable) are obtained utilizing NASCET criteria, using the distal internal carotid diameter as the denominator. CONTRAST:  66mL OMNIPAQUE IOHEXOL 350 MG/ML SOLN COMPARISON:  CT head without contrast 01/27/2019. MR face without and with contrast 09/27/2018. MR head without contrast 11/02/2014. FINDINGS: CTA NECK FINDINGS Aortic arch: A 3 vessel arch configuration is present. Atherosclerotic changes are present at the arch and at the origins the great vessels without significant stenosis. There is no aneurysm. Right carotid system: The right common carotid artery is within normal limits. Atherosclerotic calcifications are present at the right carotid bifurcation without a significant stenosis. Cervical right ICA is normal. Left carotid system: The left common carotid artery is within normal limits. Atherosclerotic calcifications are present at the left carotid bifurcation the lumen of the left ICA is narrowed 1.5 mm of over the course of 10 mm. The more distal cervical ICA is symmetric. Vertebral arteries: The vertebral arteries originate from the subclavian arteries bilaterally without significant stenosis. The right vertebral artery is slightly dominant left. There is no significant stenosis of either vertebral artery in the neck. Skeleton: Cervical spine is fused anteriorly at C4-5 and C5-6. There is slight listhesis above and below the fusion. No focal lytic or blastic lesions are present. Other neck: Bilateral subcentimeter thyroid nodules are present. There is no dominant nodule. No follow-up is necessary. Soft tissues the neck are otherwise unremarkable. Upper chest: The lung apices are clear.  Thoracic inlet is normal. Review of the MIP  images confirms the above findings CTA HEAD FINDINGS Anterior circulation: Atherosclerotic calcifications present within the cavernous left internal carotid artery. The internal carotid arteries are otherwise within normal limits from the skull base through the ICA termini bilaterally. The A1 and M1 segments are normal. There is mild narrowing of less than 50% in the proximal right M1 segment. Left M1 segment is within normal limits to the bifurcation. The right A1 segment is dominant. The anterior communicating artery is patent. There is segmental irregularity within the distal ACA and MCA branch vessels bilaterally without a significant proximal stenosis or occlusion. Posterior circulation: The right vertebral artery is dominant. PICA origins are visualized and normal. Basilar artery is normal. There is a high-grade stenosis of proximal left P1 segment. Moderate right P2 segment stenosis is present. Segmental irregularity is present within  the distal PCA branches bilaterally. Venous sinuses: The dural sinuses are patent. The straight sinus deep cerebral veins are intact. Cortical veins are unremarkable. Anatomic variants: None Review of the MIP images confirms the above findings IMPRESSION: 1. Proximal right M1 segment stenosis of less than 50% relative to the more distal vessel. 2. No emergent large vessel occlusion. 3. High-grade stenoses of the proximal left P1 segment and more distal right P2 segment. 4. Diffuse distal small vessel disease throughout the circle-of-Willis. 5. Atherosclerotic changes at the aortic arch right carotid artery bifurcation without significant stenosis. 6. 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 Electronically Signed   By: San Morelle M.D.   On: 01/27/2019 15:37   Ct Head Code Stroke Wo Contrast  Result Date: 01/27/2019 CLINICAL DATA:  Code stroke. Left-sided weakness and slurred speech beginning today. Symptoms were present upon waking. Last seen normal at 10  p.m. last night. EXAM: CT HEAD WITHOUT CONTRAST TECHNIQUE: Contiguous axial images were obtained from the base of the skull through the vertex without intravenous contrast. COMPARISON:  CT head without contrast 08/12/2010. MR head without contrast 11/02/2014 and MR face 09/27/2018 FINDINGS: Brain: Extensive periventricular and subcortical white matter changes bilaterally are similar to previous studies. No acute infarct, hemorrhage, or mass lesion is present. Basal ganglia are intact. Insular ribbon is normal. Gray-white differentiation is preserved. No acute or focal cortical lesions are evident. Vascular: Atherosclerotic calcifications are present within the cavernous internal carotid arteries bilaterally. There is no hyperdense vessel. Skull: Calvarium is intact. No focal lytic or blastic lesions are present. Sinuses/Orbits: The paranasal sinuses and mastoid air cells are clear. Bilateral lens replacements are noted. Globes and orbits are otherwise unremarkable. ASPECTS [facility] Stroke Program Early CT Score) - Ganglionic level infarction (caudate, lentiform nuclei, internal capsule, insula, M1-M3 cortex): 7/7 - Supraganglionic infarction (M4-M6 cortex): 3/3 Total score (0-10 with 10 being normal): 10/10 IMPRESSION: 1. No acute intracranial abnormality. 2. Stable appearance of chronic atrophy and white matter disease likely reflecting the sequela of chronic microvascular ischemia. 3. ASPECTS is 10/10 These results were called by telephone at the time of interpretation on 01/27/2019 at 2:50 pm to provider PHILLIP STAFFORD, MD , who verbally acknowledged these results. Electronically Signed   By: San Morelle M.D.   On: 01/27/2019 14:51    EKG:  Sinus rhythm heart rate 78 no ST elevation or depression  IMPRESSION AND PLAN:   78 year old female with hypertension who presents to the ER with speech difficulty and left-sided weakness.  1.  Acute CVA by symptoms: CT head without acute pathology.  CTA  neck as stated above. Continue aspirin and add Plavix 75 mg daily Statin PT, OT and speech consultation Hemoglobin A1c and lipid panel Continue telemetry monitoring Appreciate neurology consultation Echocardiogram with bubble and MRI brain  2.  Essential hypertension: Hold hypertensive medications to allow permissive hypertension.    All the records are reviewed and case discussed with ED provider. Management plans discussed with the patient and she is in agreement  CODE STATUS: FULL  TOTAL TIME TAKING CARE OF THIS PATIENT: 45 minutes.    Bettey Costa M.D on 01/27/2019 at 3:53 PM  Between 7am to 6pm - Pager - [phone]  After 6pm go to [email or web address] - password [email or web address]  Office  [phone]  CC: Primary care physician; Rusty Aus, MD

## 2019-01-27 NOTE — Progress Notes (Signed)
Ch responded to a code STROKE regarding the pt. Ch was present while the pt was being assessed by the care team. Pt's daughter had just arrived to be bedside an help with the pt's assessment. As reported by the pt's daughter the pt had a significant change in speech and weakness on her L side when walking but able to respond appropriately yet her mobility did not seem steady when asked to walk. Pt was brought in by daughter. Daughter reported that the pt has not had any major health changes recently. Ch informed pt daughter that it would be a high possibility that the pt will be admitted. Daughter understood and was thankful of support.  F/U should include spiritual support post-COVID swab w/ results.   01/27/19 1500  Clinical Encounter Type  Visited With Patient and family together;Health care provider  Visit Type ED;Critical Care;Social support  Referral From Physician  Consult/Referral To Chaplain  Stress Factors  Patient Stress Factors Health changes;Loss of control;Major life changes  Family Stress Factors Major life changes

## 2019-01-27 NOTE — Progress Notes (Signed)
Family Meeting Note  Advance Directive:no  Today a meeting took place with the Patient.   The following clinical team members were present during this meeting:MD  The following were discussed:Patient's diagnosis: cva , Patient's progosis: Unable to determine and Goals for treatment: Full Code  Additional follow-up to be provided: chaplain to create AD   Time spent during discussion:16 minutes  Bettey Costa, MD

## 2019-01-27 NOTE — ED Notes (Signed)
Pt given food tray and drink per order.

## 2019-01-28 ENCOUNTER — Inpatient Hospital Stay: Payer: Medicare Other

## 2019-01-28 ENCOUNTER — Inpatient Hospital Stay
Admit: 2019-01-28 | Discharge: 2019-01-28 | Disposition: A | Discharge status: Home / Self Care | Payer: Medicare Other | Attending: Internal Medicine | Admitting: Internal Medicine

## 2019-01-28 DIAGNOSIS — I6521 Occlusion and stenosis of right carotid artery: Secondary | ICD-10-CM

## 2019-01-28 DIAGNOSIS — I69992 Facial weakness following unspecified cerebrovascular disease: Secondary | ICD-10-CM | POA: Diagnosis not present

## 2019-01-28 DIAGNOSIS — Z20828 Contact with and (suspected) exposure to other viral communicable diseases: Secondary | ICD-10-CM | POA: Diagnosis not present

## 2019-01-28 DIAGNOSIS — Z8673 Personal history of transient ischemic attack (TIA), and cerebral infarction without residual deficits: Secondary | ICD-10-CM

## 2019-01-28 DIAGNOSIS — R402362 Coma scale, best motor response, obeys commands, at arrival to emergency department: Secondary | ICD-10-CM | POA: Diagnosis not present

## 2019-01-28 DIAGNOSIS — I639 Cerebral infarction, unspecified: Secondary | ICD-10-CM | POA: Diagnosis not present

## 2019-01-28 DIAGNOSIS — Z79899 Other long term (current) drug therapy: Secondary | ICD-10-CM | POA: Diagnosis not present

## 2019-01-28 DIAGNOSIS — R471 Dysarthria and anarthria: Secondary | ICD-10-CM | POA: Diagnosis not present

## 2019-01-28 DIAGNOSIS — R262 Difficulty in walking, not elsewhere classified: Secondary | ICD-10-CM | POA: Diagnosis not present

## 2019-01-28 DIAGNOSIS — E785 Hyperlipidemia, unspecified: Secondary | ICD-10-CM | POA: Diagnosis not present

## 2019-01-28 DIAGNOSIS — Z9842 Cataract extraction status, left eye: Secondary | ICD-10-CM | POA: Diagnosis not present

## 2019-01-28 DIAGNOSIS — Z85828 Personal history of other malignant neoplasm of skin: Secondary | ICD-10-CM | POA: Diagnosis not present

## 2019-01-28 DIAGNOSIS — Z885 Allergy status to narcotic agent status: Secondary | ICD-10-CM | POA: Diagnosis not present

## 2019-01-28 DIAGNOSIS — Z23 Encounter for immunization: Secondary | ICD-10-CM | POA: Diagnosis present

## 2019-01-28 DIAGNOSIS — R402252 Coma scale, best verbal response, oriented, at arrival to emergency department: Secondary | ICD-10-CM | POA: Diagnosis not present

## 2019-01-28 DIAGNOSIS — Z7982 Long term (current) use of aspirin: Secondary | ICD-10-CM | POA: Diagnosis not present

## 2019-01-28 DIAGNOSIS — Z961 Presence of intraocular lens: Secondary | ICD-10-CM | POA: Diagnosis not present

## 2019-01-28 DIAGNOSIS — R402142 Coma scale, eyes open, spontaneous, at arrival to emergency department: Secondary | ICD-10-CM | POA: Diagnosis not present

## 2019-01-28 DIAGNOSIS — Z888 Allergy status to other drugs, medicaments and biological substances status: Secondary | ICD-10-CM | POA: Diagnosis not present

## 2019-01-28 DIAGNOSIS — G8194 Hemiplegia, unspecified affecting left nondominant side: Secondary | ICD-10-CM | POA: Diagnosis not present

## 2019-01-28 DIAGNOSIS — I10 Essential (primary) hypertension: Secondary | ICD-10-CM | POA: Diagnosis not present

## 2019-01-28 DIAGNOSIS — Z9841 Cataract extraction status, right eye: Secondary | ICD-10-CM | POA: Diagnosis not present

## 2019-01-28 DIAGNOSIS — R29703 NIHSS score 3: Secondary | ICD-10-CM | POA: Diagnosis not present

## 2019-01-28 LAB — LIPID PANEL
Cholesterol: 231 mg/dL — ABNORMAL HIGH (ref 0–200)
HDL: 48 mg/dL (ref >40)
LDL Cholesterol: 169 mg/dL — ABNORMAL HIGH (ref 0–99)
Total CHOL/HDL Ratio: 4.8 RATIO
Triglycerides: 72 mg/dL (ref <150)
VLDL: 14 mg/dL (ref 0–40)

## 2019-01-28 LAB — ECHOCARDIOGRAM COMPLETE
Height: 65 in
Weight: 2160 oz

## 2019-01-28 LAB — SARS CORONAVIRUS 2 (TAT 6-24 HRS): SARS Coronavirus 2: NEGATIVE

## 2019-01-28 MED ORDER — ATORVASTATIN CALCIUM 20 MG PO TABS: 40.0000 mg | ORAL_TABLET | Freq: Every day | ORAL | Status: DC

## 2019-01-28 MED ORDER — ATORVASTATIN CALCIUM 40 MG PO TABS: 40.0000 mg | ORAL_TABLET | Freq: Every day | ORAL | 0 refills | Status: DC

## 2019-01-28 MED ORDER — ROSUVASTATIN CALCIUM 10 MG PO TABS: 10.0000 mg | ORAL_TABLET | Freq: Every day | ORAL | 1 refills | Status: AC

## 2019-01-28 MED ORDER — LABETALOL HCL 5 MG/ML IV SOLN: 5.0000 mg | Freq: Four times a day (QID) | INTRAVENOUS | Status: DC | PRN

## 2019-01-28 MED ORDER — AMLODIPINE BESYLATE 5 MG PO TABS
5.0000 mg | ORAL_TABLET | Freq: Every day | ORAL | Status: DC
  Administered 2019-01-28: 10:00:00 5 mg via ORAL
  Filled 2019-01-28: qty 1

## 2019-01-28 MED ORDER — CLOPIDOGREL BISULFATE 75 MG PO TABS: 75.0000 mg | ORAL_TABLET | Freq: Every day | ORAL | 2 refills | Status: DC

## 2019-01-28 MED ORDER — ROSUVASTATIN CALCIUM 10 MG PO TABS: 10.0000 mg | ORAL_TABLET | Freq: Every day | ORAL | Status: DC

## 2019-01-28 NOTE — Progress Notes (Signed)
   01/28/19 1200  Clinical Encounter Type  Visited With Patient and family together  Visit Type Initial  Referral From Nurse  Stress Factors  Patient Stress Factors Loss of control  Ch went in for Ad education. Pt states that she already has AD. Pt was unhappy with not being able to get discharged today. Daughter was at bedside explaining to her the need for her to stay because the doctor needs to monitor her. Ch let them know that chaplain services available whenever they need it.

## 2019-01-28 NOTE — Progress Notes (Signed)
[facility] NAME: Kristina Banks    MR#:  [id number]  DATE OF BIRTH:  Jan 07, 1941  SUBJECTIVE:   Patient came in with dysarthria and dragging of her left leg. Daughter Helene Kelp in the room. Patient feels a lot better. Speech is pretty much back to baseline.  Awaiting to get PT eval. Denies any dysphagia or visual disturbance REVIEW OF SYSTEMS:   Review of Systems  Constitutional: Negative for chills, fever and weight loss.  HENT: Negative for ear discharge, ear pain and nosebleeds.   Eyes: Negative for blurred vision, pain and discharge.  Respiratory: Negative for sputum production, shortness of breath, wheezing and stridor.   Cardiovascular: Negative for chest pain, palpitations, orthopnea and PND.  Gastrointestinal: Negative for abdominal pain, diarrhea, nausea and vomiting.  Genitourinary: Negative for frequency and urgency.  Musculoskeletal: Negative for back pain and joint pain.  Neurological: Positive for speech change and weakness. Negative for sensory change and focal weakness.  Psychiatric/Behavioral: Negative for depression and hallucinations. The patient is not nervous/anxious.    Tolerating Diet:yes Tolerating PT: pending  DRUG ALLERGIES:   Allergies  Allergen Reactions  . Codeine Nausea And Vomiting and Other (See Comments)    Hallucinations  . Cephalexin Other (See Comments)    Dizziness, PATIENT DENIES  . Oxycodone Other (See Comments)    Dizzy    VITALS:  Blood pressure (!) 180/81, pulse 62, temperature 98.5 F (36.9 C), temperature source Axillary, resp. rate 17, height 5\' 5"  (1.651 m), weight 61.2 kg, SpO2 99 %.  PHYSICAL EXAMINATION:   Physical Exam  GENERAL:  78 y.o.-year-old patient lying in the bed with no acute distress.  EYES: Pupils equal, round, reactive to light and accommodation. No scleral icterus. Extraocular muscles intact.  HEENT: Head atraumatic, normocephalic. Oropharynx and  nasopharynx clear.  NECK:  Supple, no jugular venous distention. No thyroid enlargement, no tenderness.  LUNGS: Normal breath sounds bilaterally, no wheezing, rales, rhonchi. No use of accessory muscles of respiration.  CARDIOVASCULAR: S1, S2 normal. No murmurs, rubs, or gallops.  ABDOMEN: Soft, nontender, nondistended. Bowel sounds present. No organomegaly or mass.  EXTREMITIES: No cyanosis, clubbing or edema b/l.    NEUROLOGIC: Cranial nerves II through XII are intact. No focal Motor or sensory deficits b/l. Mild dysarthria. PSYCHIATRIC:  patient is alert and oriented x 3.  SKIN: No obvious rash, lesion, or ulcer.   LABORATORY PANEL:  CBC Recent Labs  Lab 01/27/19 1444  WBC 15.0*  HGB 14.5  HCT 42.9  PLT 291    Chemistries  Recent Labs  Lab 01/27/19 1444  NA 137  K 3.7  CL 105  CO2 22  GLUCOSE 151*  BUN 29*  CREATININE 0.82  CALCIUM 9.6  AST 21  ALT 20  ALKPHOS 41  BILITOT 0.7   Cardiac Enzymes No results for input(s): TROPONINI in the last 168 hours. RADIOLOGY:  Ct Code Stroke Cta Head W/wo Contrast  Result Date: 01/27/2019 CLINICAL DATA:  New onset of slurred speech and left-sided weakness upon waking today. Last seen normal at 10 o'clock last night. EXAM: CT ANGIOGRAPHY HEAD AND NECK TECHNIQUE: Multidetector CT imaging of the head and neck was performed using the standard protocol during bolus administration of intravenous contrast. Multiplanar CT image reconstructions and MIPs were obtained to evaluate the vascular anatomy. Carotid stenosis measurements (when applicable) are obtained utilizing NASCET criteria, using the distal internal carotid diameter as the denominator. CONTRAST:  60mL OMNIPAQUE IOHEXOL 350 MG/ML  SOLN COMPARISON:  CT head without contrast 01/27/2019. MR face without and with contrast 09/27/2018. MR head without contrast 11/02/2014. FINDINGS: CTA NECK FINDINGS Aortic arch: A 3 vessel arch configuration is present. Atherosclerotic changes are present  at the arch and at the origins the great vessels without significant stenosis. There is no aneurysm. Right carotid system: The right common carotid artery is within normal limits. Atherosclerotic calcifications are present at the right carotid bifurcation without a significant stenosis. Cervical right ICA is normal. Left carotid system: The left common carotid artery is within normal limits. Atherosclerotic calcifications are present at the left carotid bifurcation the lumen of the left ICA is narrowed 1.5 mm of over the course of 10 mm. The more distal cervical ICA is symmetric. Vertebral arteries: The vertebral arteries originate from the subclavian arteries bilaterally without significant stenosis. The right vertebral artery is slightly dominant left. There is no significant stenosis of either vertebral artery in the neck. Skeleton: Cervical spine is fused anteriorly at C4-5 and C5-6. There is slight listhesis above and below the fusion. No focal lytic or blastic lesions are present. Other neck: Bilateral subcentimeter thyroid nodules are present. There is no dominant nodule. No follow-up is necessary. Soft tissues the neck are otherwise unremarkable. Upper chest: The lung apices are clear.  Thoracic inlet is normal. Review of the MIP images confirms the above findings CTA HEAD FINDINGS Anterior circulation: Atherosclerotic calcifications present within the cavernous left internal carotid artery. The internal carotid arteries are otherwise within normal limits from the skull base through the ICA termini bilaterally. The A1 and M1 segments are normal. There is mild narrowing of less than 50% in the proximal right M1 segment. Left M1 segment is within normal limits to the bifurcation. The right A1 segment is dominant. The anterior communicating artery is patent. There is segmental irregularity within the distal ACA and MCA branch vessels bilaterally without a significant proximal stenosis or occlusion. Posterior  circulation: The right vertebral artery is dominant. PICA origins are visualized and normal. Basilar artery is normal. There is a high-grade stenosis of proximal left P1 segment. Moderate right P2 segment stenosis is present. Segmental irregularity is present within the distal PCA branches bilaterally. Venous sinuses: The dural sinuses are patent. The straight sinus deep cerebral veins are intact. Cortical veins are unremarkable. Anatomic variants: None Review of the MIP images confirms the above findings IMPRESSION: 1. Proximal right M1 segment stenosis of less than 50% relative to the more distal vessel. 2. No emergent large vessel occlusion. 3. High-grade stenoses of the proximal left P1 segment and more distal right P2 segment. 4. Diffuse distal small vessel disease throughout the circle-of-Willis. 5. Atherosclerotic changes at the aortic arch right carotid artery bifurcation without significant stenosis. 6. 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 Electronically Signed   By: San Morelle M.D.   On: 01/27/2019 15:37   Ct Code Stroke Cta Neck W/wo Contrast  Result Date: 01/27/2019 CLINICAL DATA:  New onset of slurred speech and left-sided weakness upon waking today. Last seen normal at 10 o'clock last night. EXAM: CT ANGIOGRAPHY HEAD AND NECK TECHNIQUE: Multidetector CT imaging of the head and neck was performed using the standard protocol during bolus administration of intravenous contrast. Multiplanar CT image reconstructions and MIPs were obtained to evaluate the vascular anatomy. Carotid stenosis measurements (when applicable) are obtained utilizing NASCET criteria, using the distal internal carotid diameter as the denominator. CONTRAST:  105mL OMNIPAQUE IOHEXOL 350 MG/ML SOLN COMPARISON:  CT  head without contrast 01/27/2019. MR face without and with contrast 09/27/2018. MR head without contrast 11/02/2014. FINDINGS: CTA NECK FINDINGS Aortic arch: A 3 vessel arch configuration is present.  Atherosclerotic changes are present at the arch and at the origins the great vessels without significant stenosis. There is no aneurysm. Right carotid system: The right common carotid artery is within normal limits. Atherosclerotic calcifications are present at the right carotid bifurcation without a significant stenosis. Cervical right ICA is normal. Left carotid system: The left common carotid artery is within normal limits. Atherosclerotic calcifications are present at the left carotid bifurcation the lumen of the left ICA is narrowed 1.5 mm of over the course of 10 mm. The more distal cervical ICA is symmetric. Vertebral arteries: The vertebral arteries originate from the subclavian arteries bilaterally without significant stenosis. The right vertebral artery is slightly dominant left. There is no significant stenosis of either vertebral artery in the neck. Skeleton: Cervical spine is fused anteriorly at C4-5 and C5-6. There is slight listhesis above and below the fusion. No focal lytic or blastic lesions are present. Other neck: Bilateral subcentimeter thyroid nodules are present. There is no dominant nodule. No follow-up is necessary. Soft tissues the neck are otherwise unremarkable. Upper chest: The lung apices are clear.  Thoracic inlet is normal. Review of the MIP images confirms the above findings CTA HEAD FINDINGS Anterior circulation: Atherosclerotic calcifications present within the cavernous left internal carotid artery. The internal carotid arteries are otherwise within normal limits from the skull base through the ICA termini bilaterally. The A1 and M1 segments are normal. There is mild narrowing of less than 50% in the proximal right M1 segment. Left M1 segment is within normal limits to the bifurcation. The right A1 segment is dominant. The anterior communicating artery is patent. There is segmental irregularity within the distal ACA and MCA branch vessels bilaterally without a significant proximal  stenosis or occlusion. Posterior circulation: The right vertebral artery is dominant. PICA origins are visualized and normal. Basilar artery is normal. There is a high-grade stenosis of proximal left P1 segment. Moderate right P2 segment stenosis is present. Segmental irregularity is present within the distal PCA branches bilaterally. Venous sinuses: The dural sinuses are patent. The straight sinus deep cerebral veins are intact. Cortical veins are unremarkable. Anatomic variants: None Review of the MIP images confirms the above findings IMPRESSION: 1. Proximal right M1 segment stenosis of less than 50% relative to the more distal vessel. 2. No emergent large vessel occlusion. 3. High-grade stenoses of the proximal left P1 segment and more distal right P2 segment. 4. Diffuse distal small vessel disease throughout the circle-of-Willis. 5. Atherosclerotic changes at the aortic arch right carotid artery bifurcation without significant stenosis. 6. 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 Electronically Signed   By: San Morelle M.D.   On: 01/27/2019 15:37   Mr Brain Wo Contrast  Result Date: 01/28/2019 CLINICAL DATA:  Stroke, follow-up additional history: Patient awoke last night with difficulty speaking and left-sided weakness. EXAM: MRI HEAD WITHOUT CONTRAST TECHNIQUE: Multiplanar, multiecho pulse sequences of the brain and surrounding structures were obtained without intravenous contrast. COMPARISON:  CT angiogram head/neck and non-contrast CT head 01/27/2019, brain MRI 11/02/2014 FINDINGS: Brain: Linear focus of restricted diffusion within the posterior limb of right internal capsule/basal ganglia consistent with acute infarct. There is no evidence of intracranial mass. No midline shift or extra-axial fluid collection. No chronic intracranial hemorrhage. Moderate scattered and confluent T2/FLAIR hyperintensity within the cerebral white  matter is nonspecific, but consistent with chronic small  vessel ischemic disease. Mild generalized parenchymal atrophy. Vascular: Flow voids maintained within the proximal large arterial vessels. Skull and upper cervical spine: Normal marrow signal. Sinuses/Orbits: Imaged globes and orbits demonstrate no acute abnormality. Mild mucosal thickening within the bilateral ethmoid and left sphenoid sinus. Small right maxillary sinus mucous retention cyst. No significant mastoid effusion. 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 These results will be called to the ordering clinician or representative by the Radiologist Assistant, and communication documented in the PACS or zVision Dashboard. Mild generalized parenchymal atrophy with moderate chronic small vessel ischemic disease. Electronically Signed   By: Kellie Simmering   On: 01/28/2019 10:21   Ct Head Code Stroke Wo Contrast  Result Date: 01/27/2019 CLINICAL DATA:  Code stroke. Left-sided weakness and slurred speech beginning today. Symptoms were present upon waking. Last seen normal at 10 p.m. last night. EXAM: CT HEAD WITHOUT CONTRAST TECHNIQUE: Contiguous axial images were obtained from the base of the skull through the vertex without intravenous contrast. COMPARISON:  CT head without contrast 08/12/2010. MR head without contrast 11/02/2014 and MR face 09/27/2018 FINDINGS: Brain: Extensive periventricular and subcortical white matter changes bilaterally are similar to previous studies. No acute infarct, hemorrhage, or mass lesion is present. Basal ganglia are intact. Insular ribbon is normal. Gray-white differentiation is preserved. No acute or focal cortical lesions are evident. Vascular: Atherosclerotic calcifications are present within the cavernous internal carotid arteries bilaterally. There is no hyperdense vessel. Skull: Calvarium is intact. No focal lytic or blastic lesions are present. Sinuses/Orbits: The paranasal sinuses and mastoid air cells are clear.  Bilateral lens replacements are noted. Globes and orbits are otherwise unremarkable. ASPECTS [facility] Stroke Program Early CT Score) - Ganglionic level infarction (caudate, lentiform nuclei, internal capsule, insula, M1-M3 cortex): 7/7 - Supraganglionic infarction (M4-M6 cortex): 3/3 Total score (0-10 with 10 being normal): 10/10 IMPRESSION: 1. No acute intracranial abnormality. 2. Stable appearance of chronic atrophy and white matter disease likely reflecting the sequela of chronic microvascular ischemia. 3. ASPECTS is 10/10 These results were called by telephone at the time of interpretation on 01/27/2019 at 2:50 pm to provider PHILLIP STAFFORD, MD , who verbally acknowledged these results. Electronically Signed   By: San Morelle M.D.   On: 01/27/2019 14:51   ASSESSMENT AND PLAN:   Mckenzi Zieser  is a 78 y.o. female with a known history of hypertension who presents today to the emergency room due to difficulty speaking and left-sided weakness.  Code stroke was called in the ER.  Neurologist has seen the patient who is recommending further work-up for CVA  1.  Acute CVA right internal capsule/basal ganglia - CT head without acute pathology.  - CTA neck 65% stenosis proximal ICA left, vascular consultation with Dr. Lucky Cowboy. -Cardiology consultation with Dr. Clayborn Bigness would since patient may require preop cardiac clearance if  there are plans for left carotid stenosis surgery/stent -Continue aspirin and added Plavix 75 mg daily neurology recommendation -Statin-- not too keen on taking it -physical therapy recommends no PT needs Continue telemetry monitoring Appreciate neurology consultation Echocardiogram results pending  2.  Essential hypertension: -will resume blood pressure meds since it is more than 99991111 systolic  3. Hyperlipidemia -statins--- not too keen on taking it. Importance of statins explained.  4. DVT prophylaxis Lovenox  Discussed with daughter Helene Kelp in the room. Discussed  with neurology Case discussed with Care Management/Social Worker. Management plans discussed with the patient, family  and they are in agreement.  CODE STATUS: full DVT Prophylaxis: Lovenox  TOTAL TIME TAKING CARE OF THIS PATIENT: **30* minutes.  >50% time spent on counselling and coordination of care  POSSIBLE D/C IN **1 to 2* DAYS, DEPENDING ON CLINICAL CONDITION.  Note: This dictation was prepared with Dragon dictation along with smaller phrase technology. Any transcriptional errors that result from this process are unintentional.  Fritzi Mandes M.D on 01/28/2019 at 11:34 AM  Between 7am to 6pm - Pager - [phone]  After 6pm go to [email or web address] - password [email or web address] [facility]  Office  [phone]  CC: Primary care physician; Rusty Aus, MDPatient ID: Kristina Banks, female   DOB: 1940-10-11, 78 y.o.   MRN: [id number]

## 2019-01-28 NOTE — Progress Notes (Signed)
*  PRELIMINARY RESULTS* Echocardiogram 2D Echocardiogram has been performed.  Kristina Banks 01/28/2019, 9:56 AM

## 2019-01-28 NOTE — Progress Notes (Signed)
OT Cancellation Note  Patient Details Name: DENETRA HIRATA MRN: [id number] DOB: [birth year]   Cancelled Treatment:    Reason Eval/Treat Not Completed: Other (comment). Consult received, chart reviewed. Upon attempt, pt meeting with PA. Will re-attempt OT evaluation at later date/time as pt is available and medically appropriate.   Jeni Salles, MPH, MS, OTR/L ascom [phone] 01/28/19, 2:00 PM

## 2019-01-28 NOTE — Consult Note (Signed)
[facility] Consult Note  MRN : [id number]  Kristina Banks is a 78 y.o. (Jan 24, 1941) female who presents with chief complaint of  Chief Complaint  Patient presents with  . Code Stroke   History of Present Illness:  The patient is a 78 year old female with multiple medical issues (see below) who presented to the [facility] emergency department with a chief complaint of slurred speech, difficulty walking and left-sided weakness which started yesterday a.m.  Patient was not a candidate for TPA administration being outside the window.  Patient's daughter notes that her mother who was in her usual state of health the evening before.  Patient states that her symptoms have resolved today.  Denies any slurred speech, left-sided weakness or inability to walk today.  CTA Neck 01/27/19: 1. Proximal right M1 segment stenosis of less than 50% relative to the more distal vessel. 2. No emergent large vessel occlusion. 3. High-grade stenoses of the proximal left P1 segment and more distal right P2 segment. 4. Diffuse distal small vessel disease throughout the circle-of-Willis. 5. Atherosclerotic changes at the aortic arch right carotid artery bifurcation without significant stenosis. 6. 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  Mild generalized parenchymal atrophy with moderate chronic small vessel ischemic disease.  Vascular surgery was consulted by Dr. Posey Pronto for further recommendations  Current Facility-Administered Medications  Medication Dose Route Frequency Provider Last Rate Last Dose  . acetaminophen (TYLENOL) tablet 650 mg  650 mg Oral Q4H PRN Bettey Costa, MD   650 mg at 01/28/19 0018   Or  . acetaminophen (TYLENOL) solution 650 mg  650 mg Per Tube Q4H PRN Bettey Costa, MD       Or  . acetaminophen (TYLENOL) suppository 650 mg   650 mg Rectal Q4H PRN Mody, Sital, MD      . amLODipine (NORVASC) tablet 5 mg  5 mg Oral Daily Fritzi Mandes, MD   5 mg at 01/28/19 1009  . aspirin EC tablet 81 mg  81 mg Oral Daily Bettey Costa, MD   81 mg at 01/28/19 0831  . clopidogrel (PLAVIX) tablet 75 mg  75 mg Oral Daily Bettey Costa, MD   75 mg at 01/28/19 0831  . enoxaparin (LOVENOX) injection 40 mg  40 mg Subcutaneous Q24H Bettey Costa, MD   40 mg at 01/27/19 2010  . labetalol (NORMODYNE) injection 5 mg  5 mg Intravenous Q6H PRN Fritzi Mandes, MD      . rosuvastatin (CRESTOR) tablet 10 mg  10 mg Oral q1800 Fritzi Mandes, MD      . sodium chloride flush (NS) 0.9 % injection 3 mL  3 mL Intravenous Once Carrie Mew, MD       Current Outpatient Medications  Medication Sig Dispense Refill  . amLODipine (NORVASC) 5 MG tablet Take 5 mg by mouth daily.    Marland Kitchen aspirin 81 MG tablet Take 81 mg by mouth daily.    . Calcium Carbonate-Vitamin D (CALCIUM 600 + D PO) Take 1 tablet by mouth daily.     . hydrochlorothiazide (HYDRODIURIL) 12.5 MG tablet Take 12.5 mg by mouth daily.     . meloxicam (MOBIC) 15 MG tablet Take 1 tablet (15 mg total) by mouth daily. 30 tablet 0  . olmesartan (BENICAR) 20 MG tablet Take 20 mg by mouth daily.    Marland Kitchen atorvastatin (LIPITOR) 40 MG tablet Take  1 tablet (40 mg total) by mouth daily at 6 PM. 30 tablet 0  . [START ON 01/29/2019] clopidogrel (PLAVIX) 75 MG tablet Take 1 tablet (75 mg total) by mouth daily. 30 tablet 2  . rosuvastatin (CRESTOR) 10 MG tablet Take 1 tablet (10 mg total) by mouth daily at 6 PM. 30 tablet 1   Past Medical History:  Diagnosis Date  . Arthritis   . Basal cell carcinoma (BCC) of right cheek 01/03/2016  . Change in hair   . Complication of anesthesia    "takes a little longer to wake up"   . DDD (degenerative disc disease), cervical   . Discolored nails    TOENAILS  . HBP (high blood pressure)   . History of dysplastic nevus 03/13/2013  . Hypertension   . Muscle pain   . PONV  (postoperative nausea and vomiting)    nausea  . Shortness of breath dyspnea    with exertion  . Stroke [facility]) 2006   tia  . TIA (transient ischemic attack)    Past Surgical History:  Procedure Laterality Date  . ABDOMINAL HYSTERECTOMY    . ARM SURGERY Left    BONE TUMOR  . ARTHOSCOPIC ROTAOR CUFF REPAIR Left 12/16/2014   Procedure: ARTHROSCOPIC ROTATOR CUFF REPAIR;  Surgeon: Earnestine Leys, MD;  Location: [facility] ORS;  Service: Orthopedics;  Laterality: Left;  . bone tumor removed     Lt arm  . CATARACT EXTRACTION W/PHACO Right 11/01/2015   Procedure: CATARACT EXTRACTION PHACO AND INTRAOCULAR LENS PLACEMENT (IOC);  Surgeon: Estill Cotta, MD;  Location: [facility] ORS;  Service: Ophthalmology;  Laterality: Right;  [place] 01:18 AP% 22.8 CDE 36.00 fluid pack lot # [id number]  . CATARACT EXTRACTION W/PHACO Left 12/22/2015   Procedure: CATARACT EXTRACTION PHACO AND INTRAOCULAR LENS PLACEMENT (IOC);  Surgeon: Estill Cotta, MD;  Location: [facility] ORS;  Service: Ophthalmology;  Laterality: Left;  [place] 00:51 AP% 23.4 CDE 22.40 Fluid pack lot # [id number]  . COLONOSCOPY    . COLONOSCOPY WITH PROPOFOL N/A 04/18/2017   Procedure: COLONOSCOPY WITH PROPOFOL;  Surgeon: Manya Silvas, MD;  Location: [facility] END[facility]COPY;  Service: Endoscopy;  Laterality: N/A;  . FOOT SURGERY Right    10.3.14  . FRACTURE SURGERY    . fractured knee Left   . LUMBAR LAMINECTOMY/DECOMPRESSION MICRODISCECTOMY Left 07/07/2015   Procedure: Left Lumbar two-three Extraforaminal Microdiskectomy;  Surgeon: Leeroy Cha, MD;  Location: [facility];  Service: Neurosurgery;  Laterality: Left;  Left L2-3 Extraforaminal Microdiskectomy  . TONSILLECTOMY     Social History Social History   Tobacco Use  . Smoking status: Never Smoker  . Smokeless tobacco: Never Used  Substance Use Topics  . Alcohol use: No  . Drug use: No   Family History History reviewed. No pertinent family history.   Denies family history of peripheral artery  disease, venous disease and bleeding/clotting disorders.  Allergies  Allergen Reactions  . Codeine Nausea And Vomiting and Other (See Comments)    Hallucinations  . Cephalexin Other (See Comments)    Dizziness, PATIENT DENIES  . Oxycodone Other (See Comments)    Dizzy   REVIEW OF SYSTEMS (Negative unless checked)  Constitutional: [] Weight loss  [] Fever  [] Chills Cardiac: [] Chest pain   [] Chest pressure   [] Palpitations   [] Shortness of breath when laying flat   [] Shortness of breath at rest   [] Shortness of breath with exertion. Vascular:  [] Pain in legs with walking   [] Pain in legs at rest   [] Pain in legs  when laying flat   [] Claudication   [] Pain in feet when walking  [] Pain in feet at rest  [] Pain in feet when laying flat   [] History of DVT   [] Phlebitis   [] Swelling in legs   [] Varicose veins   [] Non-healing ulcers Pulmonary:   [] Uses home oxygen   [] Productive cough   [] Hemoptysis   [] Wheeze  [] COPD   [] Asthma Neurologic:  [] Dizziness  [] Blackouts   [] Seizures   [x] History of stroke   [x] History of TIA  [x] Aphasia   [] Temporary blindness   [] Dysphagia   [x] Weakness or numbness in arms   [x] Weakness or numbness in legs Musculoskeletal:  [] Arthritis   [] Joint swelling   [] Joint pain   [] Low back pain Hematologic:  [] Easy bruising  [] Easy bleeding   [] Hypercoagulable state   [] Anemic  [] Hepatitis Gastrointestinal:  [] Blood in stool   [] Vomiting blood  [] Gastroesophageal reflux/heartburn   [] Difficulty swallowing. Genitourinary:  [] Chronic kidney disease   [] Difficult urination  [] Frequent urination  [] Burning with urination   [] Blood in urine Skin:  [] Rashes   [] Ulcers   [] Wounds Psychological:  [] History of anxiety   []  History of major depression.  Physical Examination  Vitals:   01/28/19 0432 01/28/19 0603 01/28/19 0828 01/28/19 1456  BP: (!) 175/74 (!) 180/78 (!) 180/81 (!) 172/67  Pulse: 70 61 62 68  Resp:  18 17 20   Temp: 98.2 F (36.8 C)  98.5 F (36.9 C) (!) 97.5 F  (36.4 C)  TempSrc: Oral  Axillary Oral  SpO2: 97% 98% 99% 99%  Weight:      Height:       Body mass index is 22.47 kg/m. Gen:  WD/WN, NAD Face: Symmetrical.  Tongue midline. Head: [place]/AT, No temporalis wasting. Prominent temp pulse not noted. Ear/Nose/Throat: Hearing grossly intact, nares w/o erythema or drainage, oropharynx w/o Erythema/Exudate Eyes: Sclera non-icteric, conjunctiva clear Neck: Trachea midline.  No JVD.  Left-sided bruit noted. Pulmonary:  Good air movement, respirations not labored, equal bilaterally.  Cardiac: RRR, normal S1, S2. Vascular:  Vessel Right Left  Radial Palpable Palpable  Ulnar Palpable Palpable  Brachial Palpable Palpable  Carotid Palpable, without bruit Palpable, without bruit  Aorta Not palpable N/A  Femoral Palpable Palpable  Popliteal Palpable Palpable  PT Palpable Palpable  DP Palpable Palpable   Gastrointestinal: soft, non-tender/non-distended. No guarding/reflex.  Musculoskeletal: M/S 5/5 throughout.  Extremities without ischemic changes.  No deformity or atrophy. No edema. Neurologic: Sensation grossly intact in extremities.  Symmetrical.  Speech is fluent. Motor exam as listed above. Psychiatric: Judgment intact, Mood & affect appropriate for pt's clinical situation. Dermatologic: No rashes or ulcers noted.  No cellulitis or open wounds. Lymph : No Cervical, Axillary, or Inguinal lymphadenopathy.  CBC Lab Results  Component Value Date   WBC 15.0 (H) 01/27/2019   HGB 14.5 01/27/2019   HCT 42.9 01/27/2019   MCV 91.5 01/27/2019   PLT 291 01/27/2019   BMET    Component Value Date/Time   NA 137 01/27/2019 1444   K 3.7 01/27/2019 1444   CL 105 01/27/2019 1444   CO2 22 01/27/2019 1444   GLUCOSE 151 (H) 01/27/2019 1444   BUN 29 (H) 01/27/2019 1444   CREATININE 0.82 01/27/2019 1444   CALCIUM 9.6 01/27/2019 1444   GFRNONAA >60 01/27/2019 1444   GFRAA >60 01/27/2019 1444   Estimated Creatinine Clearance: 50.1 mL/min (by C-G  formula based on SCr of 0.82 mg/dL).  COAG Lab Results  Component Value Date   INR 0.9 01/27/2019  Radiology Ct Code Stroke Cta Head W/wo Contrast  Result Date: 01/27/2019 CLINICAL DATA:  New onset of slurred speech and left-sided weakness upon waking today. Last seen normal at 10 o'clock last night. EXAM: CT ANGIOGRAPHY HEAD AND NECK TECHNIQUE: Multidetector CT imaging of the head and neck was performed using the standard protocol during bolus administration of intravenous contrast. Multiplanar CT image reconstructions and MIPs were obtained to evaluate the vascular anatomy. Carotid stenosis measurements (when applicable) are obtained utilizing NASCET criteria, using the distal internal carotid diameter as the denominator. CONTRAST:  78mL OMNIPAQUE IOHEXOL 350 MG/ML SOLN COMPARISON:  CT head without contrast 01/27/2019. MR face without and with contrast 09/27/2018. MR head without contrast 11/02/2014. FINDINGS: CTA NECK FINDINGS Aortic arch: A 3 vessel arch configuration is present. Atherosclerotic changes are present at the arch and at the origins the great vessels without significant stenosis. There is no aneurysm. Right carotid system: The right common carotid artery is within normal limits. Atherosclerotic calcifications are present at the right carotid bifurcation without a significant stenosis. Cervical right ICA is normal. Left carotid system: The left common carotid artery is within normal limits. Atherosclerotic calcifications are present at the left carotid bifurcation the lumen of the left ICA is narrowed 1.5 mm of over the course of 10 mm. The more distal cervical ICA is symmetric. Vertebral arteries: The vertebral arteries originate from the subclavian arteries bilaterally without significant stenosis. The right vertebral artery is slightly dominant left. There is no significant stenosis of either vertebral artery in the neck. Skeleton: Cervical spine is fused anteriorly at C4-5 and C5-6.  There is slight listhesis above and below the fusion. No focal lytic or blastic lesions are present. Other neck: Bilateral subcentimeter thyroid nodules are present. There is no dominant nodule. No follow-up is necessary. Soft tissues the neck are otherwise unremarkable. Upper chest: The lung apices are clear.  Thoracic inlet is normal. Review of the MIP images confirms the above findings CTA HEAD FINDINGS Anterior circulation: Atherosclerotic calcifications present within the cavernous left internal carotid artery. The internal carotid arteries are otherwise within normal limits from the skull base through the ICA termini bilaterally. The A1 and M1 segments are normal. There is mild narrowing of less than 50% in the proximal right M1 segment. Left M1 segment is within normal limits to the bifurcation. The right A1 segment is dominant. The anterior communicating artery is patent. There is segmental irregularity within the distal ACA and MCA branch vessels bilaterally without a significant proximal stenosis or occlusion. Posterior circulation: The right vertebral artery is dominant. PICA origins are visualized and normal. Basilar artery is normal. There is a high-grade stenosis of proximal left P1 segment. Moderate right P2 segment stenosis is present. Segmental irregularity is present within the distal PCA branches bilaterally. Venous sinuses: The dural sinuses are patent. The straight sinus deep cerebral veins are intact. Cortical veins are unremarkable. Anatomic variants: None Review of the MIP images confirms the above findings IMPRESSION: 1. Proximal right M1 segment stenosis of less than 50% relative to the more distal vessel. 2. No emergent large vessel occlusion. 3. High-grade stenoses of the proximal left P1 segment and more distal right P2 segment. 4. Diffuse distal small vessel disease throughout the circle-of-Willis. 5. Atherosclerotic changes at the aortic arch right carotid artery bifurcation without  significant stenosis. 6. 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 Electronically Signed   By: San Morelle M.D.   On: 01/27/2019 15:37   Ct Code Stroke  Cta Neck W/wo Contrast  Result Date: 01/27/2019 CLINICAL DATA:  New onset of slurred speech and left-sided weakness upon waking today. Last seen normal at 10 o'clock last night. EXAM: CT ANGIOGRAPHY HEAD AND NECK TECHNIQUE: Multidetector CT imaging of the head and neck was performed using the standard protocol during bolus administration of intravenous contrast. Multiplanar CT image reconstructions and MIPs were obtained to evaluate the vascular anatomy. Carotid stenosis measurements (when applicable) are obtained utilizing NASCET criteria, using the distal internal carotid diameter as the denominator. CONTRAST:  92mL OMNIPAQUE IOHEXOL 350 MG/ML SOLN COMPARISON:  CT head without contrast 01/27/2019. MR face without and with contrast 09/27/2018. MR head without contrast 11/02/2014. FINDINGS: CTA NECK FINDINGS Aortic arch: A 3 vessel arch configuration is present. Atherosclerotic changes are present at the arch and at the origins the great vessels without significant stenosis. There is no aneurysm. Right carotid system: The right common carotid artery is within normal limits. Atherosclerotic calcifications are present at the right carotid bifurcation without a significant stenosis. Cervical right ICA is normal. Left carotid system: The left common carotid artery is within normal limits. Atherosclerotic calcifications are present at the left carotid bifurcation the lumen of the left ICA is narrowed 1.5 mm of over the course of 10 mm. The more distal cervical ICA is symmetric. Vertebral arteries: The vertebral arteries originate from the subclavian arteries bilaterally without significant stenosis. The right vertebral artery is slightly dominant left. There is no significant stenosis of either vertebral artery in the neck. Skeleton: Cervical spine is  fused anteriorly at C4-5 and C5-6. There is slight listhesis above and below the fusion. No focal lytic or blastic lesions are present. Other neck: Bilateral subcentimeter thyroid nodules are present. There is no dominant nodule. No follow-up is necessary. Soft tissues the neck are otherwise unremarkable. Upper chest: The lung apices are clear.  Thoracic inlet is normal. Review of the MIP images confirms the above findings CTA HEAD FINDINGS Anterior circulation: Atherosclerotic calcifications present within the cavernous left internal carotid artery. The internal carotid arteries are otherwise within normal limits from the skull base through the ICA termini bilaterally. The A1 and M1 segments are normal. There is mild narrowing of less than 50% in the proximal right M1 segment. Left M1 segment is within normal limits to the bifurcation. The right A1 segment is dominant. The anterior communicating artery is patent. There is segmental irregularity within the distal ACA and MCA branch vessels bilaterally without a significant proximal stenosis or occlusion. Posterior circulation: The right vertebral artery is dominant. PICA origins are visualized and normal. Basilar artery is normal. There is a high-grade stenosis of proximal left P1 segment. Moderate right P2 segment stenosis is present. Segmental irregularity is present within the distal PCA branches bilaterally. Venous sinuses: The dural sinuses are patent. The straight sinus deep cerebral veins are intact. Cortical veins are unremarkable. Anatomic variants: None Review of the MIP images confirms the above findings IMPRESSION: 1. Proximal right M1 segment stenosis of less than 50% relative to the more distal vessel. 2. No emergent large vessel occlusion. 3. High-grade stenoses of the proximal left P1 segment and more distal right P2 segment. 4. Diffuse distal small vessel disease throughout the circle-of-Willis. 5. Atherosclerotic changes at the aortic arch right  carotid artery bifurcation without significant stenosis. 6. 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 Electronically Signed   By: San Morelle M.D.   On: 01/27/2019 15:37   Mr Brain Wo Contrast  Result Date:  01/28/2019 CLINICAL DATA:  Stroke, follow-up additional history: Patient awoke last night with difficulty speaking and left-sided weakness. EXAM: MRI HEAD WITHOUT CONTRAST TECHNIQUE: Multiplanar, multiecho pulse sequences of the brain and surrounding structures were obtained without intravenous contrast. COMPARISON:  CT angiogram head/neck and non-contrast CT head 01/27/2019, brain MRI 11/02/2014 FINDINGS: Brain: Linear focus of restricted diffusion within the posterior limb of right internal capsule/basal ganglia consistent with acute infarct. There is no evidence of intracranial mass. No midline shift or extra-axial fluid collection. No chronic intracranial hemorrhage. Moderate scattered and confluent T2/FLAIR hyperintensity within the cerebral white matter is nonspecific, but consistent with chronic small vessel ischemic disease. Mild generalized parenchymal atrophy. Vascular: Flow voids maintained within the proximal large arterial vessels. Skull and upper cervical spine: Normal marrow signal. Sinuses/Orbits: Imaged globes and orbits demonstrate no acute abnormality. Mild mucosal thickening within the bilateral ethmoid and left sphenoid sinus. Small right maxillary sinus mucous retention cyst. No significant mastoid effusion. 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 These results will be called to the ordering clinician or representative by the Radiologist Assistant, and communication documented in the PACS or zVision Dashboard. Mild generalized parenchymal atrophy with moderate chronic small vessel ischemic disease. Electronically Signed   By: Kellie Simmering   On: 01/28/2019 10:21   Ct Head Code Stroke Wo Contrast  Result Date:  01/27/2019 CLINICAL DATA:  Code stroke. Left-sided weakness and slurred speech beginning today. Symptoms were present upon waking. Last seen normal at 10 p.m. last night. EXAM: CT HEAD WITHOUT CONTRAST TECHNIQUE: Contiguous axial images were obtained from the base of the skull through the vertex without intravenous contrast. COMPARISON:  CT head without contrast 08/12/2010. MR head without contrast 11/02/2014 and MR face 09/27/2018 FINDINGS: Brain: Extensive periventricular and subcortical white matter changes bilaterally are similar to previous studies. No acute infarct, hemorrhage, or mass lesion is present. Basal ganglia are intact. Insular ribbon is normal. Gray-white differentiation is preserved. No acute or focal cortical lesions are evident. Vascular: Atherosclerotic calcifications are present within the cavernous internal carotid arteries bilaterally. There is no hyperdense vessel. Skull: Calvarium is intact. No focal lytic or blastic lesions are present. Sinuses/Orbits: The paranasal sinuses and mastoid air cells are clear. Bilateral lens replacements are noted. Globes and orbits are otherwise unremarkable. ASPECTS [facility] Stroke Program Early CT Score) - Ganglionic level infarction (caudate, lentiform nuclei, internal capsule, insula, M1-M3 cortex): 7/7 - Supraganglionic infarction (M4-M6 cortex): 3/3 Total score (0-10 with 10 being normal): 10/10 IMPRESSION: 1. No acute intracranial abnormality. 2. Stable appearance of chronic atrophy and white matter disease likely reflecting the sequela of chronic microvascular ischemia. 3. ASPECTS is 10/10 These results were called by telephone at the time of interpretation on 01/27/2019 at 2:50 pm to provider PHILLIP STAFFORD, MD , who verbally acknowledged these results. Electronically Signed   By: San Morelle M.D.   On: 01/27/2019 14:51   Assessment/Plan The patient is a 78 year old female with multiple medical issues (see below) who presented to the  [facility] emergency department with a chief complaint of slurred speech, difficulty walking and left-sided weakness which started yesterday a.m. 1.  Carotid stenosis: Reviewed CTA of the neck with Dr. Lucky Cowboy.  Estimate approximately 65 to 70% stenosis of the left ICA.  In the setting of symptoms with acute right CVA would recommend left carotid endarterectomy in approximately 1 month.  Have consulted Dr. Clayborn Bigness for cardiac clearance.  Had long discussion with the patient and her daughter  who is at the bedside in regard to the procedure, risks and benefits.  All questions answered.  The patient wishes to proceed.  The patient is being discharged today and was instructed to call her office and ask for Mickel Baas our surgical scheduler to schedule a date for surgery in approximately 1 month. 2.  Acute CVA: Patient with TIA/CVA in the past.  MRI of head today was notable for acute infarct within the posterior limb of right internal capsule/basal ganglia.  Due to acute CVA will wait approximately 1 month for carotid artery repair.  Agree with aspirin/Plavix.  Follow-up with neurology. 3.  Hypertension: On appropriate medications. Encouraged good control as its slows the progression of atherosclerotic disease.  Discussed with Dr. Mayme Genta, PA-C  01/28/2019 3:33 PM  This note was created with Dragon medical transcription system.  Any error is purely unintentional

## 2019-01-28 NOTE — Progress Notes (Signed)
Pt with 12 1/2 second run of SVT per central monitoring. Dorna Bloom RN

## 2019-01-28 NOTE — Progress Notes (Signed)
Patient adamant that she wants to discharge.  Has been seen by both cardiology and vascular. Made Dr. Posey Pronto aware, she will call to speak with patient and discharge her.  Clarise Cruz, BSN

## 2019-01-28 NOTE — Discharge Summary (Addendum)
[facility] [place] [facility] [place] NAME: Kristina Banks    MR#:  [id number]  DATE OF BIRTH:  1940-09-29  DATE OF ADMISSION:  01/27/2019 ADMITTING PHYSICIAN: Bettey Costa, MD  DATE OF DISCHARGE:01/28/2019  PRIMARY CARE PHYSICIAN: Rusty Aus, MD    ADMISSION DIAGNOSIS:  Acute ischemic stroke ([place]) [I63.9] Left-sided weakness [R53.1]  DISCHARGE DIAGNOSIS:  Acute infarct within the posterior limb of right internal capsule/basal ganglia left carotid artery stenosis-- further workup per Dr. dew  SECONDARY DIAGNOSIS:   Past Medical History:  Diagnosis Date  . Arthritis   . Basal cell carcinoma (BCC) of right cheek 01/03/2016  . Change in hair   . Complication of anesthesia    "takes a little longer to wake up"   . DDD (degenerative disc disease), cervical   . Discolored nails    TOENAILS  . HBP (high blood pressure)   . History of dysplastic nevus 03/13/2013  . Hypertension   . Muscle pain   . PONV (postoperative nausea and vomiting)    nausea  . Shortness of breath dyspnea    with exertion  . Stroke [facility]) 2006   tia  . TIA (transient ischemic attack)     HOSPITAL COURSE:  PeggyBlytheis a70 y.o.femalewith a known history of hypertension who presents today to the emergency room due to difficulty speaking and left-sided weakness. Code stroke was called in the ER. Neurologist has seen the patient who is recommending further work-up for CVA  1. Acute CVA right internal capsule/basal ganglia - CT head without acute pathology.  -CTA neck 65% stenosis proximal ICA left, vascular consultation with Dr. Lucky Cowboy. -Cardiology consultation with Dr. Clayborn Bigness would since patient may require preop cardiac clearance if  there are plans for left carotid stenosis surgery/stent -Continue aspirin and added Plavix 75 mg daily neurology recommendation -Statin-- not too keen on taking it -physical therapy recommends no PT needs Continue telemetry  monitoring Appreciate neurology consultation Echocardiogram results pending patient overall improved sooner than expected. She is requesting to go home.  2. Essential hypertension: -will resume blood pressure meds since it is more than [id number] systolic  3. Hyperlipidemia -statins--- not too keen on taking it. Importance of statins explained.  4. DVT prophylaxis Lovenox  Discussed with daughter Kristina Banks in the room. Discussed with neurology  Patient is requesting to be discharged to go home. I have spoken with daughter Kristina Banks and she is okay with it. Dr. dew has seen patient and will plan outpatient procedure in a month. Patient was seen by Dr. Clayborn Bigness and cardiology clearance will be obtained.  Patient will follow-up with PCP and Dr. dew as outpatient. Will discharge her to home. CONSULTS OBTAINED:  Treatment Team:  Neville Route, MD Yolonda Kida, MD  DRUG ALLERGIES:   Allergies  Allergen Reactions  . Codeine Nausea And Vomiting and Other (See Comments)    Hallucinations  . Cephalexin Other (See Comments)    Dizziness, PATIENT DENIES  . Oxycodone Other (See Comments)    Dizzy    DISCHARGE MEDICATIONS:   Allergies as of 01/28/2019      Reactions   Codeine Nausea And Vomiting, Other (See Comments)   Hallucinations   Cephalexin Other (See Comments)   Dizziness, PATIENT DENIES   Oxycodone Other (See Comments)   Dizzy      Medication List    TAKE these medications   amLODipine 5 MG tablet Commonly known as: NORVASC Take 5 mg by mouth daily.  aspirin 81 MG tablet Take 81 mg by mouth daily.   atorvastatin 40 MG tablet Commonly known as: LIPITOR Take 1 tablet (40 mg total) by mouth daily at 6 PM.   CALCIUM 600 + D PO Take 1 tablet by mouth daily.   clopidogrel 75 MG tablet Commonly known as: PLAVIX Take 1 tablet (75 mg total) by mouth daily. Start taking on: January 29, 2019   hydrochlorothiazide 12.5 MG tablet Commonly known as:  HYDRODIURIL Take 12.5 mg by mouth daily.   meloxicam 15 MG tablet Commonly known as: MOBIC Take 1 tablet (15 mg total) by mouth daily.   olmesartan 20 MG tablet Commonly known as: BENICAR Take 20 mg by mouth daily.   rosuvastatin 10 MG tablet Commonly known as: CRESTOR Take 1 tablet (10 mg total) by mouth daily at 6 PM.       If you experience worsening of your admission symptoms, develop shortness of breath, life threatening emergency, suicidal or homicidal thoughts you must seek medical attention immediately by calling 911 or calling your MD immediately  if symptoms less severe.  You Must read complete instructions/literature along with all the possible adverse reactions/side effects for all the Medicines you take and that have been prescribed to you. Take any new Medicines after you have completely understood and accept all the possible adverse reactions/side effects.   Please note  You were cared for by a hospitalist during your hospital stay. If you have any questions about your discharge medications or the care you received while you were in the hospital after you are discharged, you can call the unit and asked to speak with the hospitalist on call if the hospitalist that took care of you is not available. Once you are discharged, your primary care physician will handle any further medical issues. Please note that NO REFILLS for any discharge medications will be authorized once you are discharged, as it is imperative that you return to your primary care physician (or establish a relationship with a primary care physician if you do not have one) for your aftercare needs so that they can reassess your need for medications and monitor your lab values.   DATA REVIEW:   CBC  Recent Labs  Lab 01/27/19 1444  WBC 15.0*  HGB 14.5  HCT 42.9  PLT 291    Chemistries  Recent Labs  Lab 01/27/19 1444  NA 137  K 3.7  CL 105  CO2 22  GLUCOSE 151*  BUN 29*  CREATININE 0.82   CALCIUM 9.6  AST 21  ALT 20  ALKPHOS 41  BILITOT 0.7    Microbiology Results   Recent Results (from the past 240 hour(s))  SARS CORONAVIRUS 2 (TAT 6-24 HRS) Nasopharyngeal Nasopharyngeal Swab     Status: None   Collection Time: 01/27/19  5:00 PM   Specimen: Nasopharyngeal Swab  Result Value Ref Range Status   SARS Coronavirus 2 NEGATIVE NEGATIVE Final    Comment: (NOTE) SARS-CoV-2 target nucleic acids are NOT DETECTED. The SARS-CoV-2 RNA is generally detectable in upper and lower respiratory specimens during the acute phase of infection. Negative results do not preclude SARS-CoV-2 infection, do not rule out co-infections with other pathogens, and should not be used as the sole basis for treatment or other patient management decisions. Negative results must be combined with clinical observations, patient history, and epidemiological information. The expected result is Negative. Fact Sheet for Patients: SugarRoll.be Fact Sheet for Healthcare Providers: https://www.woods-mathews.com/ This test is not yet approved or  cleared by the [place] and  has been authorized for detection and/or diagnosis of SARS-CoV-2 by FDA under an Emergency Use Authorization (EUA). This EUA will remain  in effect (meaning this test can be used) for the duration of the COVID-19 declaration under Section 56 4(b)(1) of the Act, 21 U.S.C. section 360bbb-3(b)(1), unless the authorization is terminated or revoked sooner. Performed at [facility], [place] [address], [place], [place] [address]     RADIOLOGY:  Ct Code Stroke Cta Head W/wo Contrast  Result Date: 01/27/2019 CLINICAL DATA:  New onset of slurred speech and left-sided weakness upon waking today. Last seen normal at 10 o'clock last night. EXAM: CT ANGIOGRAPHY HEAD AND NECK TECHNIQUE: Multidetector CT imaging of the head and neck was performed using the standard protocol during bolus  administration of intravenous contrast. Multiplanar CT image reconstructions and MIPs were obtained to evaluate the vascular anatomy. Carotid stenosis measurements (when applicable) are obtained utilizing NASCET criteria, using the distal internal carotid diameter as the denominator. CONTRAST:  22mL OMNIPAQUE IOHEXOL 350 MG/ML SOLN COMPARISON:  CT head without contrast 01/27/2019. MR face without and with contrast 09/27/2018. MR head without contrast 11/02/2014. FINDINGS: CTA NECK FINDINGS Aortic arch: A 3 vessel arch configuration is present. Atherosclerotic changes are present at the arch and at the origins the great vessels without significant stenosis. There is no aneurysm. Right carotid system: The right common carotid artery is within normal limits. Atherosclerotic calcifications are present at the right carotid bifurcation without a significant stenosis. Cervical right ICA is normal. Left carotid system: The left common carotid artery is within normal limits. Atherosclerotic calcifications are present at the left carotid bifurcation the lumen of the left ICA is narrowed 1.5 mm of over the course of 10 mm. The more distal cervical ICA is symmetric. Vertebral arteries: The vertebral arteries originate from the subclavian arteries bilaterally without significant stenosis. The right vertebral artery is slightly dominant left. There is no significant stenosis of either vertebral artery in the neck. Skeleton: Cervical spine is fused anteriorly at C4-5 and C5-6. There is slight listhesis above and below the fusion. No focal lytic or blastic lesions are present. Other neck: Bilateral subcentimeter thyroid nodules are present. There is no dominant nodule. No follow-up is necessary. Soft tissues the neck are otherwise unremarkable. Upper chest: The lung apices are clear.  Thoracic inlet is normal. Review of the MIP images confirms the above findings CTA HEAD FINDINGS Anterior circulation: Atherosclerotic calcifications  present within the cavernous left internal carotid artery. The internal carotid arteries are otherwise within normal limits from the skull base through the ICA termini bilaterally. The A1 and M1 segments are normal. There is mild narrowing of less than 50% in the proximal right M1 segment. Left M1 segment is within normal limits to the bifurcation. The right A1 segment is dominant. The anterior communicating artery is patent. There is segmental irregularity within the distal ACA and MCA branch vessels bilaterally without a significant proximal stenosis or occlusion. Posterior circulation: The right vertebral artery is dominant. PICA origins are visualized and normal. Basilar artery is normal. There is a high-grade stenosis of proximal left P1 segment. Moderate right P2 segment stenosis is present. Segmental irregularity is present within the distal PCA branches bilaterally. Venous sinuses: The dural sinuses are patent. The straight sinus deep cerebral veins are intact. Cortical veins are unremarkable. Anatomic variants: None Review of the MIP images confirms the above findings IMPRESSION: 1. Proximal right M1 segment stenosis of less  than 50% relative to the more distal vessel. 2. No emergent large vessel occlusion. 3. High-grade stenoses of the proximal left P1 segment and more distal right P2 segment. 4. Diffuse distal small vessel disease throughout the circle-of-Willis. 5. Atherosclerotic changes at the aortic arch right carotid artery bifurcation without significant stenosis. 6. 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 Electronically Signed   By: San Morelle M.D.   On: 01/27/2019 15:37   Ct Code Stroke Cta Neck W/wo Contrast  Result Date: 01/27/2019 CLINICAL DATA:  New onset of slurred speech and left-sided weakness upon waking today. Last seen normal at 10 o'clock last night. EXAM: CT ANGIOGRAPHY HEAD AND NECK TECHNIQUE: Multidetector CT imaging of the head and neck was performed using  the standard protocol during bolus administration of intravenous contrast. Multiplanar CT image reconstructions and MIPs were obtained to evaluate the vascular anatomy. Carotid stenosis measurements (when applicable) are obtained utilizing NASCET criteria, using the distal internal carotid diameter as the denominator. CONTRAST:  63mL OMNIPAQUE IOHEXOL 350 MG/ML SOLN COMPARISON:  CT head without contrast 01/27/2019. MR face without and with contrast 09/27/2018. MR head without contrast 11/02/2014. FINDINGS: CTA NECK FINDINGS Aortic arch: A 3 vessel arch configuration is present. Atherosclerotic changes are present at the arch and at the origins the great vessels without significant stenosis. There is no aneurysm. Right carotid system: The right common carotid artery is within normal limits. Atherosclerotic calcifications are present at the right carotid bifurcation without a significant stenosis. Cervical right ICA is normal. Left carotid system: The left common carotid artery is within normal limits. Atherosclerotic calcifications are present at the left carotid bifurcation the lumen of the left ICA is narrowed 1.5 mm of over the course of 10 mm. The more distal cervical ICA is symmetric. Vertebral arteries: The vertebral arteries originate from the subclavian arteries bilaterally without significant stenosis. The right vertebral artery is slightly dominant left. There is no significant stenosis of either vertebral artery in the neck. Skeleton: Cervical spine is fused anteriorly at C4-5 and C5-6. There is slight listhesis above and below the fusion. No focal lytic or blastic lesions are present. Other neck: Bilateral subcentimeter thyroid nodules are present. There is no dominant nodule. No follow-up is necessary. Soft tissues the neck are otherwise unremarkable. Upper chest: The lung apices are clear.  Thoracic inlet is normal. Review of the MIP images confirms the above findings CTA HEAD FINDINGS Anterior  circulation: Atherosclerotic calcifications present within the cavernous left internal carotid artery. The internal carotid arteries are otherwise within normal limits from the skull base through the ICA termini bilaterally. The A1 and M1 segments are normal. There is mild narrowing of less than 50% in the proximal right M1 segment. Left M1 segment is within normal limits to the bifurcation. The right A1 segment is dominant. The anterior communicating artery is patent. There is segmental irregularity within the distal ACA and MCA branch vessels bilaterally without a significant proximal stenosis or occlusion. Posterior circulation: The right vertebral artery is dominant. PICA origins are visualized and normal. Basilar artery is normal. There is a high-grade stenosis of proximal left P1 segment. Moderate right P2 segment stenosis is present. Segmental irregularity is present within the distal PCA branches bilaterally. Venous sinuses: The dural sinuses are patent. The straight sinus deep cerebral veins are intact. Cortical veins are unremarkable. Anatomic variants: None Review of the MIP images confirms the above findings IMPRESSION: 1. Proximal right M1 segment stenosis of less than 50% relative to  the more distal vessel. 2. No emergent large vessel occlusion. 3. High-grade stenoses of the proximal left P1 segment and more distal right P2 segment. 4. Diffuse distal small vessel disease throughout the circle-of-Willis. 5. Atherosclerotic changes at the aortic arch right carotid artery bifurcation without significant stenosis. 6. 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 Electronically Signed   By: San Morelle M.D.   On: 01/27/2019 15:37   Mr Brain Wo Contrast  Result Date: 01/28/2019 CLINICAL DATA:  Stroke, follow-up additional history: Patient awoke last night with difficulty speaking and left-sided weakness. EXAM: MRI HEAD WITHOUT CONTRAST TECHNIQUE: Multiplanar, multiecho pulse sequences of the  brain and surrounding structures were obtained without intravenous contrast. COMPARISON:  CT angiogram head/neck and non-contrast CT head 01/27/2019, brain MRI 11/02/2014 FINDINGS: Brain: Linear focus of restricted diffusion within the posterior limb of right internal capsule/basal ganglia consistent with acute infarct. There is no evidence of intracranial mass. No midline shift or extra-axial fluid collection. No chronic intracranial hemorrhage. Moderate scattered and confluent T2/FLAIR hyperintensity within the cerebral white matter is nonspecific, but consistent with chronic small vessel ischemic disease. Mild generalized parenchymal atrophy. Vascular: Flow voids maintained within the proximal large arterial vessels. Skull and upper cervical spine: Normal marrow signal. Sinuses/Orbits: Imaged globes and orbits demonstrate no acute abnormality. Mild mucosal thickening within the bilateral ethmoid and left sphenoid sinus. Small right maxillary sinus mucous retention cyst. No significant mastoid effusion. 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 These results will be called to the ordering clinician or representative by the Radiologist Assistant, and communication documented in the PACS or zVision Dashboard. Mild generalized parenchymal atrophy with moderate chronic small vessel ischemic disease. Electronically Signed   By: Kellie Simmering   On: 01/28/2019 10:21   Ct Head Code Stroke Wo Contrast  Result Date: 01/27/2019 CLINICAL DATA:  Code stroke. Left-sided weakness and slurred speech beginning today. Symptoms were present upon waking. Last seen normal at 10 p.m. last night. EXAM: CT HEAD WITHOUT CONTRAST TECHNIQUE: Contiguous axial images were obtained from the base of the skull through the vertex without intravenous contrast. COMPARISON:  CT head without contrast 08/12/2010. MR head without contrast 11/02/2014 and MR face 09/27/2018 FINDINGS: Brain: Extensive  periventricular and subcortical white matter changes bilaterally are similar to previous studies. No acute infarct, hemorrhage, or mass lesion is present. Basal ganglia are intact. Insular ribbon is normal. Gray-white differentiation is preserved. No acute or focal cortical lesions are evident. Vascular: Atherosclerotic calcifications are present within the cavernous internal carotid arteries bilaterally. There is no hyperdense vessel. Skull: Calvarium is intact. No focal lytic or blastic lesions are present. Sinuses/Orbits: The paranasal sinuses and mastoid air cells are clear. Bilateral lens replacements are noted. Globes and orbits are otherwise unremarkable. ASPECTS [facility] Stroke Program Early CT Score) - Ganglionic level infarction (caudate, lentiform nuclei, internal capsule, insula, M1-M3 cortex): 7/7 - Supraganglionic infarction (M4-M6 cortex): 3/3 Total score (0-10 with 10 being normal): 10/10 IMPRESSION: 1. No acute intracranial abnormality. 2. Stable appearance of chronic atrophy and white matter disease likely reflecting the sequela of chronic microvascular ischemia. 3. ASPECTS is 10/10 These results were called by telephone at the time of interpretation on 01/27/2019 at 2:50 pm to provider PHILLIP STAFFORD, MD , who verbally acknowledged these results. Electronically Signed   By: San Morelle M.D.   On: 01/27/2019 14:51     CODE STATUS:     Code Status Orders  (From admission, onward)  Start     Ordered   01/27/19 1943  Full code  Continuous     01/27/19 1942        Code Status History    Date Active Date Inactive Code Status Order ID Comments User Context   07/07/2015 1745 07/08/2015 1905 Full Code [id number]  Leeroy Cha, MD Inpatient   06/14/2015 1231 06/16/2015 1559 Full Code [id number]  Bethena Roys, MD Inpatient   12/16/2014 1013 12/16/2014 1646 Full Code [id number]  Earnestine Leys, MD Inpatient   Advance Care Planning Activity      TOTAL TIME TAKING  CARE OF THIS PATIENT: *40* minutes.    Fritzi Mandes M.D on 01/28/2019 at 3:02 PM  Between 7am to 6pm - Pager - [phone] After 6pm go to [email or web address] - password [email or web address] [facility]  Office  [phone]  CC: Primary care physician; Rusty Aus, MD

## 2019-01-28 NOTE — Evaluation (Signed)
Physical Therapy Evaluation Patient Details Name: Kristina Banks MRN: [id number] DOB: January 25, 1941 Today's Date: 01/28/2019   History of Present Illness  Pt is a 78 y.o. female presenting to hospital 01/27/19 with slurred speech, L sided weakness, and difficulty walking.  (-) tpa.  PMH includes basal cell carcinoma, htn, h/o stroke, L RCR, lumbar lami/decompression 2017, and h/o L fx's knee.  Clinical Impression  Prior to hospital admission, pt was independent.  Pt lives alone and stays on main level of home except prefers to go to 2nd floor to take a shower (although has walk-in shower on main floor as well).  Currently pt is independent with bed mobility and transfers and CGA ambulating 70 feet with no AD.  Initial mild L LE gait impairment noted but improved with distance ambulated; limited ambulation distance d/t pt with elevated BP (resting BP 183/78; increased to 195/75 post ambulation; and decreased to 184/69 with rest break in bed with HOB elevated--nurse notified).  Pt educated on pacing and activity modification to prevent significant increases in BP with activity: pt verbalizing understanding.  Pt would benefit from skilled PT to address noted impairments and functional limitations (see below for any additional details).  Upon hospital discharge, anticipate no further PT needs.    Follow Up Recommendations No PT follow up    Equipment Recommendations  None recommended by PT    Recommendations for Other Services       Precautions / Restrictions Precautions Precautions: Fall Restrictions Weight Bearing Restrictions: No      Mobility  Bed Mobility Overal bed mobility: Independent             General bed mobility comments: no difficulties noted  Transfers Overall transfer level: Independent Equipment used: None             General transfer comment: steady safe transfers noted  Ambulation/Gait Ambulation/Gait assistance: Min guard Gait Distance (Feet): 70  Feet Assistive device: None   Gait velocity: decreased   General Gait Details: initially foot flat L LE but improved to L heelstrike with L LE advancement; mild decreased B LE step length  Stairs            Wheelchair Mobility    Modified Rankin (Stroke Patients Only)       Balance Overall balance assessment: Needs assistance Sitting-balance support: No upper extremity supported;Feet supported Sitting balance-Leahy Scale: Normal Sitting balance - Comments: steady sitting reaching outside BOS   Standing balance support: No upper extremity supported;During functional activity Standing balance-Leahy Scale: Good Standing balance comment: no loss of balance with ambulation                             Pertinent Vitals/Pain Pain Assessment: No/denies pain  HR WFL during session's activities.    Home Living Family/patient expects to be discharged to:: Private residence Living Arrangements: Alone Available Help at Discharge: Family Type of Home: House Home Access: Stairs to enter Entrance Stairs-Rails: Right Entrance Stairs-Number of Steps: [place]: Two level;Able to live on main level with bedroom/bathroom;Other (Comment)(pt reports she prefers to use walk in shower on 2nd floor)        Prior Function Level of Independence: Independent               Hand Dominance        Extremity/Trunk Assessment   Upper Extremity Assessment Upper Extremity Assessment: Defer to OT evaluation(strong B hand grip strength; overall WFL B  UE shoulder flexion and elbow flexion/extension AROM (grossly 4+/5 R UE and 4/5 L UE))    Lower Extremity Assessment Lower Extremity Assessment: RLE deficits/detail;LLE deficits/detail(intact B LE sensation, tone, proprioception, and coordination) RLE Deficits / Details: hip flexion, knee flexion/extension, and DF 5/5 LLE Deficits / Details: hip flexion, knee flexion/extension, and DF 4+/5    Cervical / Trunk  Assessment Cervical / Trunk Assessment: Normal  Communication   Communication: No difficulties  Cognition Arousal/Alertness: Awake/alert Behavior During Therapy: WFL for tasks assessed/performed Overall Cognitive Status: Within Functional Limits for tasks assessed                                        General Comments General comments (skin integrity, edema, etc.): bruising noted inner L lower leg towards knee (pt reports from slipping off a ladder in the garage getting a sign).  Nurse cleared pt for participation in physical therapy and reports ok for permissive htn (SBP <200).  Pt agreeable to PT session.    Exercises  Pacing/activity modification.   Assessment/Plan    PT Assessment Patient needs continued PT services  PT Problem List Decreased strength;Decreased balance;Decreased mobility;Cardiopulmonary status limiting activity       PT Treatment Interventions DME instruction;Gait training;Stair training;Functional mobility training;Therapeutic activities;Therapeutic exercise;Balance training;Patient/family education;Neuromuscular re-education    PT Goals (Current goals can be found in the Care Plan section)  Acute Rehab PT Goals Patient Stated Goal: to go home PT Goal Formulation: With patient Time For Goal Achievement: 02/11/19 Potential to Achieve Goals: Good    Frequency 7X/week   Barriers to discharge        Co-evaluation               AM-PAC PT "6 Clicks" Mobility  Outcome Measure Help needed turning from your back to your side while in a flat bed without using bedrails?: None Help needed moving from lying on your back to sitting on the side of a flat bed without using bedrails?: None Help needed moving to and from a bed to a chair (including a wheelchair)?: None Help needed standing up from a chair using your arms (e.g., wheelchair or bedside chair)?: None Help needed to walk in hospital room?: A Little Help needed climbing 3-5 steps  with a railing? : A Little 6 Click Score: 22    End of Session Equipment Utilized During Treatment: Gait belt Activity Tolerance: Other (comment)(Limited d/t elevated BP with activity) Patient left: in bed;with call bell/phone within reach;with bed alarm set;with family/visitor present Nurse Communication: Mobility status;Precautions;Other (comment)(Pt's BP during session.) PT Visit Diagnosis: Other abnormalities of gait and mobility (R26.89);Hemiplegia and hemiparesis Hemiplegia - Right/Left: Left Hemiplegia - caused by: Cerebral infarction    Time: 1022-1056 PT Time Calculation (min) (ACUTE ONLY): 34 min   Charges:   PT Evaluation $PT Eval Low Complexity: 1 Low PT Treatments $Therapeutic Exercise: 8-22 mins       Leitha Bleak, PT 01/28/19, 11:27 AM [phone]

## 2019-01-28 NOTE — Progress Notes (Signed)
9/14: No major event overnight Patient resting comfortably. 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 CTA 9/14: Proximal right M1 segment stenosis of less than 50% relative to the more distal vessel.No emergent large vessel occlusion.High-grade stenoses of the proximal left P1 segment and more distal right P2 segment.Diffuse distal small vessel disease throughout the circle-of-Willis.Atherosclerotic changes at the aortic arch right carotid artery bifurcation without significant stenosis.65% stenosis of the proximal left ICA over 10 mm.  Neuro exam has improved Alert, awake, oriented x4,  Speech at baseline,  following commands PERLA, EOMI, no nystagmus, VFF, flattening of left nasol labial fold, face sensation nle, uvula/tongue midline Motor: she had drift on LUEX/LLEX, 5/5 on LUEX/LLEX, 5/5 on RUEX/RLEX No sensory deficit appreciated No coordination deficit appreciated dtr and gait not checked at time of examination  Recs:  Continue neuro monitoring Continue neuroprotective measures including normothermia, normoglycemia, correct electrolytes/metabolic abnlites, treat infection continue stroke w up Start Plavix 75 mg PT/OT/OOB Consult cardiovascular for left ICA stenosis ( not related to her stroke)  9/13: admission  Chief Complaint: slurred speech, left sided wekaness HPI: Kristina Banks is an 78 y.o. female with HTN presents to ER as code stroke with difficulty speaking and left sided wekaness  LSN: [id number] am  Per daughter at bedside: daughter last saw her mon yesterday evening. This AM around 800 when she called her she noticed that he speech was 'not OK. Around 1100 am when she met her daughter noticed her to be dragging her left leg. Patient brought to ER. Head ct w/o acute abnlities. NIHS of 3. CTA w/o LVO ( discussed with dr Rowe Clack neuro radiologist). Patient not candidate for tpa due to outside window. Not  candidate for IR due to no Lvo   Past Medical History:  Diagnosis Date  . Arthritis   . Basal cell carcinoma (BCC) of right cheek 01/03/2016  . Change in hair   . Complication of anesthesia    "takes a little longer to wake up"   . DDD (degenerative disc disease), cervical   . Discolored nails    TOENAILS  . HBP (high blood pressure)   . History of dysplastic nevus 03/13/2013  . Hypertension   . Muscle pain   . PONV (postoperative nausea and vomiting)    nausea  . Shortness of breath dyspnea    with exertion  . Stroke [facility]) 2006   tia  . TIA (transient ischemic attack)     Past Surgical History:  Procedure Laterality Date  . ABDOMINAL HYSTERECTOMY    . ARM SURGERY Left    BONE TUMOR  . ARTHOSCOPIC ROTAOR CUFF REPAIR Left 12/16/2014   Procedure: ARTHROSCOPIC ROTATOR CUFF REPAIR;  Surgeon: Earnestine Leys, MD;  Location: [facility] ORS;  Service: Orthopedics;  Laterality: Left;  . bone tumor removed     Lt arm  . CATARACT EXTRACTION W/PHACO Right 11/01/2015   Procedure: CATARACT EXTRACTION PHACO AND INTRAOCULAR LENS PLACEMENT (IOC);  Surgeon: Estill Cotta, MD;  Location: [facility] ORS;  Service: Ophthalmology;  Laterality: Right;  [place] 01:18 AP% 22.8 CDE 36.00 fluid pack lot # [id number]  . CATARACT EXTRACTION W/PHACO Left 12/22/2015   Procedure: CATARACT EXTRACTION PHACO AND INTRAOCULAR LENS PLACEMENT (IOC);  Surgeon: Estill Cotta, MD;  Location: [facility] ORS;  Service: Ophthalmology;  Laterality: Left;  [place] 00:51 AP% 23.4 CDE 22.40 Fluid pack lot # [id number]  . COLONOSCOPY    . COLONOSCOPY WITH PROPOFOL N/A 04/18/2017  Procedure: COLONOSCOPY WITH PROPOFOL;  Surgeon: Manya Silvas, MD;  Location: [facility] ENDOSCOPY;  Service: Endoscopy;  Laterality: N/A;  . FOOT SURGERY Right    10.3.14  . FRACTURE SURGERY    . fractured knee Left   . LUMBAR LAMINECTOMY/DECOMPRESSION MICRODISCECTOMY Left 07/07/2015   Procedure: Left Lumbar two-three Extraforaminal Microdiskectomy;  Surgeon: Leeroy Cha, MD;  Location: [place] [facility] OR[facility];  Service: Neurosurgery;  Laterality: Left;  Left L2-3 Extraforaminal Microdiskectomy  . TONSILLECTOMY      History reviewed. No pertinent family history. Social History:  reports that she has never smoked. She has never used smokeless tobacco. She reports that she does not drink alcohol or use drugs.  Allergies:  Allergies  Allergen Reactions  . Codeine Nausea And Vomiting and Other (See Comments)    Hallucinations  . Cephalexin Other (See Comments)    Dizziness, PATIENT DENIES  . Oxycodone Other (See Comments)    Dizzy    Medications Prior to Admission  Medication Sig Dispense Refill  . amLODipine (NORVASC) 5 MG tablet Take 5 mg by mouth daily.    Marland Kitchen aspirin 81 MG tablet Take 81 mg by mouth daily.    . Calcium Carbonate-Vitamin D (CALCIUM 600 + D PO) Take 1 tablet by mouth daily.     . hydrochlorothiazide (HYDRODIURIL) 12.5 MG tablet Take 12.5 mg by mouth daily.     . meloxicam (MOBIC) 15 MG tablet Take 1 tablet (15 mg total) by mouth daily. 30 tablet 0  . olmesartan (BENICAR) 20 MG tablet Take 20 mg by mouth daily.      ROS: As hpi  Physical Examination: Blood pressure (!) 180/81, pulse 62, temperature 98.5 F (36.9 C), temperature source Axillary, resp. rate 17, height '5\' 5"'$  (1.651 m), weight 61.2 kg, SpO2 99 %.  HEENT-  Normocephalic, no lesions, without obvious abnormality.  Normal external eye and conjunctiva.  Normal TM's bilaterally.  Normal auditory canals and external ears. Normal external nose, mucus membranes and septum.  Normal pharynx. Neck supple with no masses, nodes, nodules or enlargement. Cardiovascular - regular rate and rhythm, S1, S2 normal, no murmur, click, rub or gallop Lungs - chest clear, no wheezing, rales, normal symmetric air entry, Heart exam - S1, S2 normal, no murmur, no gallop, rate regular Abdomen - soft, non-tender; bowel sounds normal; no masses,  no organomegaly Extremities - no joint deformities,  effusion, or inflammation  Neurologic Examination:    Results for orders placed or performed during the hospital encounter of 01/27/19 (from the past 48 hour(s))  Glucose, capillary     Status: Abnormal   Collection Time: 01/27/19  2:38 PM  Result Value Ref Range   Glucose-Capillary 149 (H) 70 - 99 mg/dL  Protime-INR     Status: None   Collection Time: 01/27/19  2:44 PM  Result Value Ref Range   Prothrombin Time 11.8 11.4 - 15.2 seconds   INR 0.9 0.8 - 1.2    Comment: (NOTE) INR goal varies based on device and disease states. Performed at [facility], [address] [place], [place] [address]   APTT     Status: None   Collection Time: 01/27/19  2:44 PM  Result Value Ref Range   aPTT 24 24 - 36 seconds    Comment: Performed at [facility], [address] [place], [place] [address]  CBC     Status: Abnormal   Collection Time: 01/27/19  2:44 PM  Result Value Ref Range   WBC 15.0 (  H) 4.0 - 10.5 K/uL   RBC 4.69 3.87 - 5.11 MIL/uL   Hemoglobin 14.5 12.0 - 15.0 g/dL   HCT 42.9 36.0 - 46.0 %   MCV 91.5 80.0 - 100.0 fL   MCH 30.9 26.0 - 34.0 pg   MCHC 33.8 30.0 - 36.0 g/dL   RDW 12.6 11.5 - 15.5 %   Platelets 291 150 - 400 K/uL   nRBC 0.0 0.0 - 0.2 %    Comment: Performed at [facility], [address] [place], [place] [address]  Differential     Status: Abnormal   Collection Time: 01/27/19  2:44 PM  Result Value Ref Range   Neutrophils Relative % 77 %   Neutro Abs 11.5 (H) 1.7 - 7.7 K/uL   Lymphocytes Relative 15 %   Lymphs Abs 2.3 0.7 - 4.0 K/uL   Monocytes Relative 6 %   Monocytes Absolute 0.9 0.1 - 1.0 K/uL   Eosinophils Relative 1 %   Eosinophils Absolute 0.1 0.0 - 0.5 K/uL   Basophils Relative 0 %   Basophils Absolute 0.0 0.0 - 0.1 K/uL   Immature Granulocytes 1 %   Abs Immature Granulocytes 0.15 (H) 0.00 - 0.07 K/uL    Comment: Performed at [facility], [address] [place], [place] [address]  Comprehensive  metabolic panel     Status: Abnormal   Collection Time: 01/27/19  2:44 PM  Result Value Ref Range   Sodium 137 135 - 145 mmol/L   Potassium 3.7 3.5 - 5.1 mmol/L   Chloride 105 98 - 111 mmol/L   CO2 22 22 - 32 mmol/L   Glucose, Bld 151 (H) 70 - 99 mg/dL   BUN 29 (H) 8 - 23 mg/dL   Creatinine, Ser 0.82 0.44 - 1.00 mg/dL   Calcium 9.6 8.9 - 10.3 mg/dL   Total Protein 6.9 6.5 - 8.1 g/dL   Albumin 4.1 3.5 - 5.0 g/dL   AST 21 15 - 41 U/L   ALT 20 0 - 44 U/L   Alkaline Phosphatase 41 38 - 126 U/L   Total Bilirubin 0.7 0.3 - 1.2 mg/dL   GFR calc non Af Amer >60 >60 mL/min   GFR calc Af Amer >60 >60 mL/min   Anion gap 10 5 - 15    Comment: Performed at [facility], [address] [place], [place] [address]  SARS CORONAVIRUS 2 (TAT 6-24 HRS) Nasopharyngeal Nasopharyngeal Swab     Status: None   Collection Time: 01/27/19  5:00 PM   Specimen: Nasopharyngeal Swab  Result Value Ref Range   SARS Coronavirus 2 NEGATIVE NEGATIVE    Comment: (NOTE) SARS-CoV-2 target nucleic acids are NOT DETECTED. The SARS-CoV-2 RNA is generally detectable in upper and lower respiratory specimens during the acute phase of infection. Negative results do not preclude SARS-CoV-2 infection, do not rule out co-infections with other pathogens, and should not be used as the sole basis for treatment or other patient management decisions. Negative results must be combined with clinical observations, patient history, and epidemiological information. The expected result is Negative. Fact Sheet for Patients: SugarRoll.be Fact Sheet for Healthcare Providers: https://www.woods-mathews.com/ This test is not yet approved or cleared by the [place] FDA and  has been authorized for detection and/or diagnosis of SARS-CoV-2 by FDA under an Emergency Use Authorization (EUA). This EUA will remain  in effect (meaning this test can be used) for the duration of the COVID-19  declaration under Section 56 4(b)(1) of the  Act, 21 U.S.C. section 360bbb-3(b)(1), unless the authorization is terminated or revoked sooner. Performed at [facility], [place] [address] [place], [place] [address]   Lipid panel     Status: Abnormal   Collection Time: 01/28/19  3:27 AM  Result Value Ref Range   Cholesterol 231 (H) 0 - 200 mg/dL   Triglycerides 72 <150 mg/dL   HDL 48 >40 mg/dL   Total CHOL/HDL Ratio 4.8 RATIO   VLDL 14 0 - 40 mg/dL   LDL Cholesterol 169 (H) 0 - 99 mg/dL    Comment:        Total Cholesterol/HDL:CHD Risk Coronary Heart Disease Risk Table                     Men   Women  1/2 Average Risk   3.4   3.3  Average Risk       5.0   4.4  2 X Average Risk   9.6   7.1  3 X Average Risk  23.4   11.0        Use the calculated Patient Ratio above and the CHD Risk Table to determine the patient's CHD Risk.        ATP III CLASSIFICATION (LDL):  <100     mg/dL   Optimal  100-129  mg/dL   Near or Above                    Optimal  130-159  mg/dL   Borderline  160-189  mg/dL   High  >190     mg/dL   Very High Performed at [place], [place] [address]    Ct Code Stroke Cta Head W/wo Contrast  Result Date: 01/27/2019 CLINICAL DATA:  New onset of slurred speech and left-sided weakness upon waking today. Last seen normal at 10 o'clock last night. EXAM: CT ANGIOGRAPHY HEAD AND NECK TECHNIQUE: Multidetector CT imaging of the head and neck was performed using the standard protocol during bolus administration of intravenous contrast. Multiplanar CT image reconstructions and MIPs were obtained to evaluate the vascular anatomy. Carotid stenosis measurements (when applicable) are obtained utilizing NASCET criteria, using the distal internal carotid diameter as the denominator. CONTRAST:  9m OMNIPAQUE IOHEXOL 350 MG/ML SOLN COMPARISON:  CT head without contrast 01/27/2019. MR face without and with contrast 09/27/2018. MR head without  contrast 11/02/2014. FINDINGS: CTA NECK FINDINGS Aortic arch: A 3 vessel arch configuration is present. Atherosclerotic changes are present at the arch and at the origins the great vessels without significant stenosis. There is no aneurysm. Right carotid system: The right common carotid artery is within normal limits. Atherosclerotic calcifications are present at the right carotid bifurcation without a significant stenosis. Cervical right ICA is normal. Left carotid system: The left common carotid artery is within normal limits. Atherosclerotic calcifications are present at the left carotid bifurcation the lumen of the left ICA is narrowed 1.5 mm of over the course of 10 mm. The more distal cervical ICA is symmetric. Vertebral arteries: The vertebral arteries originate from the subclavian arteries bilaterally without significant stenosis. The right vertebral artery is slightly dominant left. There is no significant stenosis of either vertebral artery in the neck. Skeleton: Cervical spine is fused anteriorly at C4-5 and C5-6. There is slight listhesis above and below the fusion. No focal lytic or blastic lesions are present. Other neck: Bilateral subcentimeter thyroid nodules are present. There is no dominant nodule. No  follow-up is necessary. Soft tissues the neck are otherwise unremarkable. Upper chest: The lung apices are clear.  Thoracic inlet is normal. Review of the MIP images confirms the above findings CTA HEAD FINDINGS Anterior circulation: Atherosclerotic calcifications present within the cavernous left internal carotid artery. The internal carotid arteries are otherwise within normal limits from the skull base through the ICA termini bilaterally. The A1 and M1 segments are normal. There is mild narrowing of less than 50% in the proximal right M1 segment. Left M1 segment is within normal limits to the bifurcation. The right A1 segment is dominant. The anterior communicating artery is patent. There is  segmental irregularity within the distal ACA and MCA branch vessels bilaterally without a significant proximal stenosis or occlusion. Posterior circulation: The right vertebral artery is dominant. PICA origins are visualized and normal. Basilar artery is normal. There is a high-grade stenosis of proximal left P1 segment. Moderate right P2 segment stenosis is present. Segmental irregularity is present within the distal PCA branches bilaterally. Venous sinuses: The dural sinuses are patent. The straight sinus deep cerebral veins are intact. Cortical veins are unremarkable. Anatomic variants: None Review of the MIP images confirms the above findings IMPRESSION: 1. Proximal right M1 segment stenosis of less than 50% relative to the more distal vessel. 2. No emergent large vessel occlusion. 3. High-grade stenoses of the proximal left P1 segment and more distal right P2 segment. 4. Diffuse distal small vessel disease throughout the circle-of-Willis. 5. Atherosclerotic changes at the aortic arch right carotid artery bifurcation without significant stenosis. 6. 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 Electronically Signed   By: San Morelle M.D.   On: 01/27/2019 15:37   Ct Code Stroke Cta Neck W/wo Contrast  Result Date: 01/27/2019 CLINICAL DATA:  New onset of slurred speech and left-sided weakness upon waking today. Last seen normal at 10 o'clock last night. EXAM: CT ANGIOGRAPHY HEAD AND NECK TECHNIQUE: Multidetector CT imaging of the head and neck was performed using the standard protocol during bolus administration of intravenous contrast. Multiplanar CT image reconstructions and MIPs were obtained to evaluate the vascular anatomy. Carotid stenosis measurements (when applicable) are obtained utilizing NASCET criteria, using the distal internal carotid diameter as the denominator. CONTRAST:  2m OMNIPAQUE IOHEXOL 350 MG/ML SOLN COMPARISON:  CT head without contrast 01/27/2019. MR face without and  with contrast 09/27/2018. MR head without contrast 11/02/2014. FINDINGS: CTA NECK FINDINGS Aortic arch: A 3 vessel arch configuration is present. Atherosclerotic changes are present at the arch and at the origins the great vessels without significant stenosis. There is no aneurysm. Right carotid system: The right common carotid artery is within normal limits. Atherosclerotic calcifications are present at the right carotid bifurcation without a significant stenosis. Cervical right ICA is normal. Left carotid system: The left common carotid artery is within normal limits. Atherosclerotic calcifications are present at the left carotid bifurcation the lumen of the left ICA is narrowed 1.5 mm of over the course of 10 mm. The more distal cervical ICA is symmetric. Vertebral arteries: The vertebral arteries originate from the subclavian arteries bilaterally without significant stenosis. The right vertebral artery is slightly dominant left. There is no significant stenosis of either vertebral artery in the neck. Skeleton: Cervical spine is fused anteriorly at C4-5 and C5-6. There is slight listhesis above and below the fusion. No focal lytic or blastic lesions are present. Other neck: Bilateral subcentimeter thyroid nodules are present. There is no dominant nodule. No follow-up is necessary. Soft  tissues the neck are otherwise unremarkable. Upper chest: The lung apices are clear.  Thoracic inlet is normal. Review of the MIP images confirms the above findings CTA HEAD FINDINGS Anterior circulation: Atherosclerotic calcifications present within the cavernous left internal carotid artery. The internal carotid arteries are otherwise within normal limits from the skull base through the ICA termini bilaterally. The A1 and M1 segments are normal. There is mild narrowing of less than 50% in the proximal right M1 segment. Left M1 segment is within normal limits to the bifurcation. The right A1 segment is dominant. The anterior  communicating artery is patent. There is segmental irregularity within the distal ACA and MCA branch vessels bilaterally without a significant proximal stenosis or occlusion. Posterior circulation: The right vertebral artery is dominant. PICA origins are visualized and normal. Basilar artery is normal. There is a high-grade stenosis of proximal left P1 segment. Moderate right P2 segment stenosis is present. Segmental irregularity is present within the distal PCA branches bilaterally. Venous sinuses: The dural sinuses are patent. The straight sinus deep cerebral veins are intact. Cortical veins are unremarkable. Anatomic variants: None Review of the MIP images confirms the above findings IMPRESSION: 1. Proximal right M1 segment stenosis of less than 50% relative to the more distal vessel. 2. No emergent large vessel occlusion. 3. High-grade stenoses of the proximal left P1 segment and more distal right P2 segment. 4. Diffuse distal small vessel disease throughout the circle-of-Willis. 5. Atherosclerotic changes at the aortic arch right carotid artery bifurcation without significant stenosis. 6. 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 Electronically Signed   By: San Morelle M.D.   On: 01/27/2019 15:37   Mr Brain Wo Contrast  Result Date: 01/28/2019 CLINICAL DATA:  Stroke, follow-up additional history: Patient awoke last night with difficulty speaking and left-sided weakness. EXAM: MRI HEAD WITHOUT CONTRAST TECHNIQUE: Multiplanar, multiecho pulse sequences of the brain and surrounding structures were obtained without intravenous contrast. COMPARISON:  CT angiogram head/neck and non-contrast CT head 01/27/2019, brain MRI 11/02/2014 FINDINGS: Brain: Linear focus of restricted diffusion within the posterior limb of right internal capsule/basal ganglia consistent with acute infarct. There is no evidence of intracranial mass. No midline shift or extra-axial fluid collection. No chronic intracranial  hemorrhage. Moderate scattered and confluent T2/FLAIR hyperintensity within the cerebral white matter is nonspecific, but consistent with chronic small vessel ischemic disease. Mild generalized parenchymal atrophy. Vascular: Flow voids maintained within the proximal large arterial vessels. Skull and upper cervical spine: Normal marrow signal. Sinuses/Orbits: Imaged globes and orbits demonstrate no acute abnormality. Mild mucosal thickening within the bilateral ethmoid and left sphenoid sinus. Small right maxillary sinus mucous retention cyst. No significant mastoid effusion. 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 These results will be called to the ordering clinician or representative by the Radiologist Assistant, and communication documented in the PACS or zVision Dashboard. Mild generalized parenchymal atrophy with moderate chronic small vessel ischemic disease. Electronically Signed   By: Kellie Simmering   On: 01/28/2019 10:21   Ct Head Code Stroke Wo Contrast  Result Date: 01/27/2019 CLINICAL DATA:  Code stroke. Left-sided weakness and slurred speech beginning today. Symptoms were present upon waking. Last seen normal at 10 p.m. last night. EXAM: CT HEAD WITHOUT CONTRAST TECHNIQUE: Contiguous axial images were obtained from the base of the skull through the vertex without intravenous contrast. COMPARISON:  CT head without contrast 08/12/2010. MR head without contrast 11/02/2014 and MR face 09/27/2018 FINDINGS: Brain: Extensive periventricular  and subcortical white matter changes bilaterally are similar to previous studies. No acute infarct, hemorrhage, or mass lesion is present. Basal ganglia are intact. Insular ribbon is normal. Gray-white differentiation is preserved. No acute or focal cortical lesions are evident. Vascular: Atherosclerotic calcifications are present within the cavernous internal carotid arteries bilaterally. There is no hyperdense vessel.  Skull: Calvarium is intact. No focal lytic or blastic lesions are present. Sinuses/Orbits: The paranasal sinuses and mastoid air cells are clear. Bilateral lens replacements are noted. Globes and orbits are otherwise unremarkable. ASPECTS [facility] Stroke Program Early CT Score) - Ganglionic level infarction (caudate, lentiform nuclei, internal capsule, insula, M1-M3 cortex): 7/7 - Supraganglionic infarction (M4-M6 cortex): 3/3 Total score (0-10 with 10 being normal): 10/10 IMPRESSION: 1. No acute intracranial abnormality. 2. Stable appearance of chronic atrophy and white matter disease likely reflecting the sequela of chronic microvascular ischemia. 3. ASPECTS is 10/10 These results were called by telephone at the time of interpretation on 01/27/2019 at 2:50 pm to provider PHILLIP STAFFORD, MD , who verbally acknowledged these results. Electronically Signed   By: San Morelle M.D.   On: 01/27/2019 14:51    Assessment: 78 y.o. female with htn presenst to er with speech difficulty and left sided weakness. NIHS 3, head CT w/o acute ablites, CTA with no LVO. Not candidate for tpa nor IR Plan: 1. HgbA1c, fasting lipid panel 2. MRI, MRA  of the brain without contrast 3. PT consult, OT consult, Speech consult 4. Echocardiogram 5. Carotid dopplers 6. Prophylactic therapy ASA 325 if no CI 7. Risk factor modification 8. Telemetry monitoring 9. Frequent neuro checks

## 2019-01-28 NOTE — Evaluation (Addendum)
Speech Language Pathology Evaluation Patient Details Name: Kristina Banks MRN: [id number] DOB: 02-13-1941 Today's Date: 01/28/2019 Time: [id number] SLP Time Calculation (min) (ACUTE ONLY): 37 min  Problem List:  Patient Active Problem List   Diagnosis Date Noted  . 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 Lumbar herniated disc 07/07/2015  . Back pain   . Hip pain   . LLQ pain   . Radiculopathy, lumbar region   . Abdominal pain 06/14/2015  . HTN (hypertension) 06/14/2015  . 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 Post-op pain 02/18/2013  . Contusion of foot 02/18/2013   Past Medical History:  Past Medical History:  Diagnosis Date  . Arthritis   . Basal cell carcinoma (BCC) of right cheek 01/03/2016  . Change in hair   . Complication of anesthesia    "takes a little longer to wake up"   . DDD (degenerative disc disease), cervical   . Discolored nails    TOENAILS  . HBP (high blood pressure)   . History of dysplastic nevus 03/13/2013  . Hypertension   . Muscle pain   . PONV (postoperative nausea and vomiting)    nausea  . Shortness of breath dyspnea    with exertion  . Stroke [facility]) 2006   tia  . TIA (transient ischemic attack)    Past Surgical History:  Past Surgical History:  Procedure Laterality Date  . ABDOMINAL HYSTERECTOMY    . ARM SURGERY Left    BONE TUMOR  . ARTHOSCOPIC ROTAOR CUFF REPAIR Left 12/16/2014   Procedure: ARTHROSCOPIC ROTATOR CUFF REPAIR;  Surgeon: Earnestine Leys, MD;  Location: [facility] ORS;  Service: Orthopedics;  Laterality: Left;  . bone tumor removed     Lt arm  . CATARACT EXTRACTION W/PHACO Right 11/01/2015   Procedure: CATARACT EXTRACTION PHACO AND INTRAOCULAR LENS PLACEMENT (IOC);  Surgeon: Estill Cotta, MD;  Location: [facility] ORS;  Service: Ophthalmology;  Laterality: Right;  [place] 01:18 AP% 22.8 CDE 36.00 fluid pack lot # [id number]  . CATARACT EXTRACTION W/PHACO Left 12/22/2015   Procedure: CATARACT EXTRACTION  PHACO AND INTRAOCULAR LENS PLACEMENT (IOC);  Surgeon: Estill Cotta, MD;  Location: [facility] ORS;  Service: Ophthalmology;  Laterality: Left;  [place] 00:51 AP% 23.4 CDE 22.40 Fluid pack lot # [id number]  . COLONOSCOPY    . COLONOSCOPY WITH PROPOFOL N/A 04/18/2017   Procedure: COLONOSCOPY WITH PROPOFOL;  Surgeon: Manya Silvas, MD;  Location: [facility] ENDOSCOPY;  Service: Endoscopy;  Laterality: N/A;  . FOOT SURGERY Right    10.3.14  . FRACTURE SURGERY    . fractured knee Left   . LUMBAR LAMINECTOMY/DECOMPRESSION MICRODISCECTOMY Left 07/07/2015   Procedure: Left Lumbar two-three Extraforaminal Microdiskectomy;  Surgeon: Leeroy Cha, MD;  Location: [place] NEURO ORS;  Service: Neurosurgery;  Laterality: Left;  Left L2-3 Extraforaminal Microdiskectomy  . TONSILLECTOMY     HPI:  Pt is a 78 y.o. female presenting to hospital 01/27/19 with slurred speech, L sided weakness, and difficulty walking.  (-) tpa.  PMH includes basal cell carcinoma, htn, h/o stroke, L RCR, lumbar lami/decompression 2017, and h/o L fx's knee.  Pt's Dtr present in room stating pt's speech is "much better today".  MRI results: "Acute infarct within the posterior limb of right internal capsule/basal ganglia".   Assessment / Plan / Recommendation Clinical Impression  Pt appears to present w/ Min oral motor (labial) decreased tone/weakness on Left side of mouth; recently reduced articulation of speech, or Dysarthria(slight-mild deficits noted per SLP). Per pt's and  Dtr's report today, pt's speech articulation/intelligibility is "much better today". Pt denied any other deficits stating she felt she was "fine now". No receptive or expressive language deficits noted; no cognitive deficits were noted. Cursory OM exam revealed Left labial decreased tone; mild Dysarthria during verbalizations and conversation in room w/ pt. Pt and Dtr were interested in OM exercises and information to take home. OM exercises targeting labial ROM/tone as well as  overall exercises and strategies targeting enhancing speech articulation were discussed and practiced(over-aerticulation/exaggeration; slowing rate; increasing volume). These exercises/handouts were given to pt to take home w/ instruction to f/u w/ PCP for Outpatient referral for further f/u and assessment if desired. Pt/Dtr agreed. NSG/CM updated.     SLP Assessment  SLP Recommendation/Assessment: All further Speech Lanaguage Pathology  needs can be addressed in the next venue of care(if pt desires to) SLP Visit Diagnosis: Dysarthria and anarthria (R47.1)    Follow Up Recommendations  (TBD by pt)    Frequency and Duration (n/a)  (n/a)      SLP Evaluation Cognition  Overall Cognitive Status: Within Functional Limits for tasks assessed Arousal/Alertness: Awake/alert Orientation Level: Oriented X4 Attention: Focused;Sustained Focused Attention: Appears intact Sustained Attention: Appears intact Awareness: Appears intact Problem Solving: Appears intact Behaviors: (n/a)       Comprehension  Auditory Comprehension Overall Auditory Comprehension: Appears within functional limits for tasks assessed(pt denied any deficits) Visual Recognition/Discrimination Discrimination: Not tested Reading Comprehension Reading Status: Not tested    Expression Expression Primary Mode of Expression: Verbal Verbal Expression Overall Verbal Expression: Appears within functional limits for tasks assessed(pt denied any deficits) Pragmatics: No impairment Written Expression Dominant Hand: Right Written Expression: Not tested   Oral / Motor  Oral Motor/Sensory Function Overall Oral Motor/Sensory Function: Mild impairment Facial ROM: Reduced left Facial Symmetry: Abnormal symmetry left Facial Strength: Reduced left Lingual ROM: Within Functional Limits Lingual Symmetry: Within Functional Limits Motor Speech Overall Motor Speech: Impaired Respiration: Within functional limits Phonation:  Normal Resonance: Within functional limits Articulation: Within functional limitis(grossly - "improved today") Intelligibility: Intelligible Motor Planning: Witnin functional limits Motor Speech Errors: Not applicable Effective Techniques: Slow rate;Over-articulate;Increased vocal intensity(strategies to aid)   [place], [place], CCC-SLP Birdie Fetty 01/28/2019, 2:54 PM

## 2019-01-28 NOTE — Evaluation (Signed)
Occupational Therapy Evaluation Patient Details Name: Kristina Banks MRN: [id number] DOB: 09/01/1940 Today's Date: 01/28/2019    History of Present Illness Pt is a 78 y.o. female presenting to hospital 01/27/19 with slurred speech, L sided weakness, and difficulty walking.  (-) tpa.  PMH includes basal cell carcinoma, htn, h/o stroke, L RCR, lumbar lami/decompression 2017, and h/o L fx's knee.   Clinical Impression   Pt seen for OT evaluation this date. Prior to hospital admission, pt was independent and active, denies deficits or need for assist. Pt lives alone. Pt expresses frustration for being admitted and wants to go home promptly. Functional/visual assessment limited 2/2 frustration. No functional or visual deficits appreciated with assessment completed this date. However, pt endorses intermittent blurry vision in L eye, denies during evaluation. Pt near baseline independence for ADL and functional mobility. Encouraged pt to minimize prolonged visual stimuli and pace herself with rest breaks for her eyes as needed. Pt/dtr verbalized understanding. No additional skilled OT needs at this time. Will sign off. Please re-consult if additional needs arise.    Follow Up Recommendations  No OT follow up    Equipment Recommendations  None recommended by OT    Recommendations for Other Services       Precautions / Restrictions Precautions Precautions: Fall Restrictions Weight Bearing Restrictions: No      Mobility Bed Mobility Overal bed mobility: Independent             General bed mobility comments: no difficulties noted  Transfers Overall transfer level: Independent Equipment used: None             General transfer comment: steady safe transfers noted    Balance Overall balance assessment: Needs assistance Sitting-balance support: No upper extremity supported;Feet supported Sitting balance-Leahy Scale: Normal Sitting balance - Comments: steady sitting reaching  outside BOS   Standing balance support: No upper extremity supported;During functional activity Standing balance-Leahy Scale: Good Standing balance comment: no loss of balance with ambulation                           ADL either performed or assessed with clinical judgement   ADL                                         General ADL Comments: near baseline     Vision Patient Visual Report: Blurring of vision(pt reports L eye intermittently blurry) Additional Comments: pt reports intermittent blurry vision with L eye, denies functional deficits, no significant deficits noted with visual testing, pt becomes increasingly frustrated limiting visual assessment     Perception     Praxis      Pertinent Vitals/Pain Pain Assessment: No/denies pain     Hand Dominance Right   Extremity/Trunk Assessment Upper Extremity Assessment Upper Extremity Assessment: (strong B hand grip strength; overall WFL B UE shoulder flexion and elbow flexion/extension AROM (grossly 4+/5 R UE and 4/5 L UE))   Lower Extremity Assessment Lower Extremity Assessment: (intact B LE sensation, tone, proprioception, and coordination)   Cervical / Trunk Assessment Cervical / Trunk Assessment: Normal   Communication Communication Communication: No difficulties   Cognition Arousal/Alertness: Awake/alert Behavior During Therapy: WFL for tasks assessed/performed Overall Cognitive Status: Within Functional Limits for tasks assessed  General Comments: frustrated, wants to leave   General Comments  bruising noted inner L lower leg towards knee (pt reports from slipping off a ladder in the garage getting a sign)    Exercises     Shoulder Instructions      Home Living Family/patient expects to be discharged to:: Private residence Living Arrangements: Alone Available Help at Discharge: Family Type of Home: House Home Access: Stairs to  enter [employer] of Steps: 4 Entrance Stairs-Rails: Right Home Layout: Two level;Able to live on main level with bedroom/bathroom;Other (Comment)(pt reports she prefers to use walk in shower on 2nd floor) Alternate Level Stairs-Number of Steps: flight Alternate Level Stairs-Rails: Left Bathroom Shower/Tub: Occupational psychologist: Standard                Prior Functioning/Environment Level of Independence: Independent                 OT Problem List: Impaired vision/perception      OT Treatment/Interventions:      OT Goals(Current goals can be found in the care plan section) Acute Rehab OT Goals Patient Stated Goal: to go home OT Goal Formulation: All assessment and education complete, DC therapy  OT Frequency:     Barriers to D/C:            Co-evaluation              AM-PAC OT "6 Clicks" Daily Activity     Outcome Measure Help from another person eating meals?: None Help from another person taking care of personal grooming?: None Help from another person toileting, which includes using toliet, bedpan, or urinal?: None Help from another person bathing (including washing, rinsing, drying)?: None Help from another person to put on and taking off regular upper body clothing?: None Help from another person to put on and taking off regular lower body clothing?: None 6 Click Score: 24   End of Session    Activity Tolerance: Patient tolerated treatment well;Treatment limited secondary to agitation Patient left: in chair;with call bell/phone within reach;with chair alarm set;with family/visitor present  OT Visit Diagnosis: Other abnormalities of gait and mobility (R26.89)                Time: 1420-1430 OT Time Calculation (min): 10 min Charges:  OT General Charges $OT Visit: 1 Visit OT Evaluation $OT Eval Low Complexity: 1 Low  Jeni Salles, MPH, MS, OTR/L ascom [phone] 01/28/19, 3:13 PM

## 2019-01-29 LAB — HEMOGLOBIN A1C
Hgb A1c MFr Bld: 6 % — ABNORMAL HIGH (ref 4.8–5.6)
Mean Plasma Glucose: 126 mg/dL

## 2019-01-29 NOTE — Consult Note (Signed)
Reason for Consult: Preop CEA history of CVA Referring Physician: Fritzi Mandes hospitalist Dr. Emily Filbert primary Vascular surgeon Dr. Rennis Chris is an 78 y.o. female.  HPI: 78 year old female history of hypertension presented with acute stroke symptoms mostly facial speech left leg numbness weakness.  Patient presented to the emergency room outside the window for thrombolytic therapy MRI was able to identify significant area of CVA acute infarct within the posterior limb of the right internal capsule basal ganglia.  Patient has been on antihypertensive patient states of been compliant does not smoke no previous CVA no cardiac history patient states she has had some improvement in her symptoms she is now preop for possible CEA because of CT of the neck suggested high-grade stenosis in the left internal carotid artery between 65 to 70%.  Cardiology was consulted for preoperative assessment.  Daughter was in the room during the interview.  The patient essentially woke up with symptoms discussions on the phone with family members all agreed that she did not sound normal but the timing of the initial onset of symptoms was difficult to assess which put outside the window for possible thrombolytics.  Past Medical History:  Diagnosis Date  . Arthritis   . Basal cell carcinoma (BCC) of right cheek 01/03/2016  . Change in hair   . Complication of anesthesia    "takes a little longer to wake up"   . DDD (degenerative disc disease), cervical   . Discolored nails    TOENAILS  . HBP (high blood pressure)   . History of dysplastic nevus 03/13/2013  . Hypertension   . Muscle pain   . PONV (postoperative nausea and vomiting)    nausea  . Shortness of breath dyspnea    with exertion  . Stroke [facility]) 2006   tia  . TIA (transient ischemic attack)     Past Surgical History:  Procedure Laterality Date  . ABDOMINAL HYSTERECTOMY    . ARM SURGERY Left    BONE TUMOR  . ARTHOSCOPIC ROTAOR CUFF REPAIR  Left 12/16/2014   Procedure: ARTHROSCOPIC ROTATOR CUFF REPAIR;  Surgeon: Earnestine Leys, MD;  Location: [facility] ORS;  Service: Orthopedics;  Laterality: Left;  . bone tumor removed     Lt arm  . CATARACT EXTRACTION W/PHACO Right 11/01/2015   Procedure: CATARACT EXTRACTION PHACO AND INTRAOCULAR LENS PLACEMENT (IOC);  Surgeon: Estill Cotta, MD;  Location: [facility] ORS;  Service: Ophthalmology;  Laterality: Right;  [place] 01:18 AP% 22.8 CDE 36.00 fluid pack lot # [id number]  . CATARACT EXTRACTION W/PHACO Left 12/22/2015   Procedure: CATARACT EXTRACTION PHACO AND INTRAOCULAR LENS PLACEMENT (IOC);  Surgeon: Estill Cotta, MD;  Location: [facility] ORS;  Service: Ophthalmology;  Laterality: Left;  [place] 00:51 AP% 23.4 CDE 22.40 Fluid pack lot # [id number]  . COLONOSCOPY    . COLONOSCOPY WITH PROPOFOL N/A 04/18/2017   Procedure: COLONOSCOPY WITH PROPOFOL;  Surgeon: Manya Silvas, MD;  Location: [facility] END[facility]COPY;  Service: Endoscopy;  Laterality: N/A;  . FOOT SURGERY Right    10.3.14  . FRACTURE SURGERY    . fractured knee Left   . LUMBAR LAMINECTOMY/DECOMPRESSION MICRODISCECTOMY Left 07/07/2015   Procedure: Left Lumbar two-three Extraforaminal Microdiskectomy;  Surgeon: Leeroy Cha, MD;  Location: [facility];  Service: Neurosurgery;  Laterality: Left;  Left L2-3 Extraforaminal Microdiskectomy  . TONSILLECTOMY      History reviewed. No pertinent family history.  Social History:  reports that she has never smoked. She has never used smokeless tobacco. She reports  that she does not drink alcohol or use drugs.  Allergies:  Allergies  Allergen Reactions  . Codeine Nausea And Vomiting and Other (See Comments)    Hallucinations  . Cephalexin Other (See Comments)    Dizziness, PATIENT DENIES  . Oxycodone Other (See Comments)    Dizzy    Medications: I have reviewed the patient's current medications.  Results for orders placed or performed during the hospital encounter of 01/27/19 (from the past 48  hour(s))  Glucose, capillary     Status: Abnormal   Collection Time: 01/27/19  2:38 PM  Result Value Ref Range   Glucose-Capillary 149 (H) 70 - 99 mg/dL  Protime-INR     Status: None   Collection Time: 01/27/19  2:44 PM  Result Value Ref Range   Prothrombin Time 11.8 11.4 - 15.2 seconds   INR 0.9 0.8 - 1.2    Comment: (NOTE) INR goal varies based on device and disease states. Performed at [facility], [address] [place], [place] [address]   APTT     Status: None   Collection Time: 01/27/19  2:44 PM  Result Value Ref Range   aPTT 24 24 - 36 seconds    Comment: Performed at [facility], [address] [place], [place] [address]  CBC     Status: Abnormal   Collection Time: 01/27/19  2:44 PM  Result Value Ref Range   WBC 15.0 (H) 4.0 - 10.5 K/uL   RBC 4.69 3.87 - 5.11 MIL/uL   Hemoglobin 14.5 12.0 - 15.0 g/dL   HCT 42.9 36.0 - 46.0 %   MCV 91.5 80.0 - 100.0 fL   MCH 30.9 26.0 - 34.0 pg   MCHC 33.8 30.0 - 36.0 g/dL   RDW 12.6 11.5 - 15.5 %   Platelets 291 150 - 400 K/uL   nRBC 0.0 0.0 - 0.2 %    Comment: Performed at [facility], [address] [place], [place] [address]  Differential     Status: Abnormal   Collection Time: 01/27/19  2:44 PM  Result Value Ref Range   Neutrophils Relative % 77 %   Neutro Abs 11.5 (H) 1.7 - 7.7 K/uL   Lymphocytes Relative 15 %   Lymphs Abs 2.3 0.7 - 4.0 K/uL   Monocytes Relative 6 %   Monocytes Absolute 0.9 0.1 - 1.0 K/uL   Eosinophils Relative 1 %   Eosinophils Absolute 0.1 0.0 - 0.5 K/uL   Basophils Relative 0 %   Basophils Absolute 0.0 0.0 - 0.1 K/uL   Immature Granulocytes 1 %   Abs Immature Granulocytes 0.15 (H) 0.00 - 0.07 K/uL    Comment: Performed at [facility], [address] [place], [place] [address]  Comprehensive metabolic panel     Status: Abnormal   Collection Time: 01/27/19  2:44 PM  Result Value Ref Range   Sodium 137 135 - 145 mmol/L   Potassium 3.7 3.5 - 5.1 mmol/L    Chloride 105 98 - 111 mmol/L   CO2 22 22 - 32 mmol/L   Glucose, Bld 151 (H) 70 - 99 mg/dL   BUN 29 (H) 8 - 23 mg/dL   Creatinine, Ser 0.82 0.44 - 1.00 mg/dL   Calcium 9.6 8.9 - 10.3 mg/dL   Total Protein 6.9 6.5 - 8.1 g/dL   Albumin 4.1 3.5 - 5.0 g/dL   AST 21 15 - 41 U/L   ALT 20 0 - 44 U/L   Alkaline Phosphatase  41 38 - 126 U/L   Total Bilirubin 0.7 0.3 - 1.2 mg/dL   GFR calc non Af Amer >60 >60 mL/min   GFR calc Af Amer >60 >60 mL/min   Anion gap 10 5 - 15    Comment: Performed at [facility], [place], [place] [address]  SARS CORONAVIRUS 2 (TAT 6-24 HRS) Nasopharyngeal Nasopharyngeal Swab     Status: None   Collection Time: 01/27/19  5:00 PM   Specimen: Nasopharyngeal Swab  Result Value Ref Range   SARS Coronavirus 2 NEGATIVE NEGATIVE    Comment: (NOTE) SARS-CoV-2 target nucleic acids are NOT DETECTED. The SARS-CoV-2 RNA is generally detectable in upper and lower respiratory specimens during the acute phase of infection. Negative results do not preclude SARS-CoV-2 infection, do not rule out co-infections with other pathogens, and should not be used as the sole basis for treatment or other patient management decisions. Negative results must be combined with clinical observations, patient history, and epidemiological information. The expected result is Negative. Fact Sheet for Patients: SugarRoll.be Fact Sheet for Healthcare Providers: https://www.woods-mathews.com/ This test is not yet approved or cleared by the [place] FDA and  has been authorized for detection and/or diagnosis of SARS-CoV-2 by FDA under an Emergency Use Authorization (EUA). This EUA will remain  in effect (meaning this test can be used) for the duration of the COVID-19 declaration under Section 56 4(b)(1) of the Act, 21 U.S.C. section 360bbb-3(b)(1), unless the authorization is terminated or revoked sooner. Performed at [facility], [place] [address] [place], [place] [address]   Hemoglobin A1c     Status: Abnormal   Collection Time: 01/28/19  3:27 AM  Result Value Ref Range   Hgb A1c MFr Bld 6.0 (H) 4.8 - 5.6 %    Comment: (NOTE)         Prediabetes: 5.7 - 6.4         Diabetes: >6.4         Glycemic control for adults with diabetes: <7.0    Mean Plasma Glucose 126 mg/dL    Comment: (NOTE) Performed At: [facility] [place], [place] [id number] Rush Farmer MD [id number]   Lipid panel     Status: Abnormal   Collection Time: 01/28/19  3:27 AM  Result Value Ref Range   Cholesterol 231 (H) 0 - 200 mg/dL   Triglycerides 72 <150 mg/dL   HDL 48 >40 mg/dL   Total CHOL/HDL Ratio 4.8 RATIO   VLDL 14 0 - 40 mg/dL   LDL Cholesterol 169 (H) 0 - 99 mg/dL    Comment:        Total Cholesterol/HDL:CHD Risk Coronary Heart Disease Risk Table                     Men   Women  1/2 Average Risk   3.4   3.3  Average Risk       5.0   4.4  2 X Average Risk   9.6   7.1  3 X Average Risk  23.4   11.0        Use the calculated Patient Ratio above and the CHD Risk Table to determine the patient's CHD Risk.        ATP III CLASSIFICATION (LDL):  <100     mg/dL   Optimal  100-129  mg/dL   Near or Above  Optimal  130-159  mg/dL   Borderline  160-189  mg/dL   High  >190     mg/dL   Very High Performed at [facility], [place], [place] [address]     Ct Code Stroke Cta Head W/wo Contrast  Result Date: 01/27/2019 CLINICAL DATA:  New onset of slurred speech and left-sided weakness upon waking today. Last seen normal at 10 o'clock last night. EXAM: CT ANGIOGRAPHY HEAD AND NECK TECHNIQUE: Multidetector CT imaging of the head and neck was performed using the standard protocol during bolus administration of intravenous contrast. Multiplanar CT image reconstructions and MIPs were obtained to evaluate the vascular anatomy. Carotid stenosis measurements  (when applicable) are obtained utilizing NASCET criteria, using the distal internal carotid diameter as the denominator. CONTRAST:  55mL OMNIPAQUE IOHEXOL 350 MG/ML SOLN COMPARISON:  CT head without contrast 01/27/2019. MR face without and with contrast 09/27/2018. MR head without contrast 11/02/2014. FINDINGS: CTA NECK FINDINGS Aortic arch: A 3 vessel arch configuration is present. Atherosclerotic changes are present at the arch and at the origins the great vessels without significant stenosis. There is no aneurysm. Right carotid system: The right common carotid artery is within normal limits. Atherosclerotic calcifications are present at the right carotid bifurcation without a significant stenosis. Cervical right ICA is normal. Left carotid system: The left common carotid artery is within normal limits. Atherosclerotic calcifications are present at the left carotid bifurcation the lumen of the left ICA is narrowed 1.5 mm of over the course of 10 mm. The more distal cervical ICA is symmetric. Vertebral arteries: The vertebral arteries originate from the subclavian arteries bilaterally without significant stenosis. The right vertebral artery is slightly dominant left. There is no significant stenosis of either vertebral artery in the neck. Skeleton: Cervical spine is fused anteriorly at C4-5 and C5-6. There is slight listhesis above and below the fusion. No focal lytic or blastic lesions are present. Other neck: Bilateral subcentimeter thyroid nodules are present. There is no dominant nodule. No follow-up is necessary. Soft tissues the neck are otherwise unremarkable. Upper chest: The lung apices are clear.  Thoracic inlet is normal. Review of the MIP images confirms the above findings CTA HEAD FINDINGS Anterior circulation: Atherosclerotic calcifications present within the cavernous left internal carotid artery. The internal carotid arteries are otherwise within normal limits from the skull base through the ICA  termini bilaterally. The A1 and M1 segments are normal. There is mild narrowing of less than 50% in the proximal right M1 segment. Left M1 segment is within normal limits to the bifurcation. The right A1 segment is dominant. The anterior communicating artery is patent. There is segmental irregularity within the distal ACA and MCA branch vessels bilaterally without a significant proximal stenosis or occlusion. Posterior circulation: The right vertebral artery is dominant. PICA origins are visualized and normal. Basilar artery is normal. There is a high-grade stenosis of proximal left P1 segment. Moderate right P2 segment stenosis is present. Segmental irregularity is present within the distal PCA branches bilaterally. Venous sinuses: The dural sinuses are patent. The straight sinus deep cerebral veins are intact. Cortical veins are unremarkable. Anatomic variants: None Review of the MIP images confirms the above findings IMPRESSION: 1. Proximal right M1 segment stenosis of less than 50% relative to the more distal vessel. 2. No emergent large vessel occlusion. 3. High-grade stenoses of the proximal left P1 segment and more distal right P2 segment. 4. Diffuse distal small vessel disease throughout the circle-of-Willis. 5. Atherosclerotic changes  at the aortic arch right carotid artery bifurcation without significant stenosis. 6. 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 Electronically Signed   By: San Morelle M.D.   On: 01/27/2019 15:37   Ct Code Stroke Cta Neck W/wo Contrast  Result Date: 01/27/2019 CLINICAL DATA:  New onset of slurred speech and left-sided weakness upon waking today. Last seen normal at 10 o'clock last night. EXAM: CT ANGIOGRAPHY HEAD AND NECK TECHNIQUE: Multidetector CT imaging of the head and neck was performed using the standard protocol during bolus administration of intravenous contrast. Multiplanar CT image reconstructions and MIPs were obtained to evaluate the vascular  anatomy. Carotid stenosis measurements (when applicable) are obtained utilizing NASCET criteria, using the distal internal carotid diameter as the denominator. CONTRAST:  87mL OMNIPAQUE IOHEXOL 350 MG/ML SOLN COMPARISON:  CT head without contrast 01/27/2019. MR face without and with contrast 09/27/2018. MR head without contrast 11/02/2014. FINDINGS: CTA NECK FINDINGS Aortic arch: A 3 vessel arch configuration is present. Atherosclerotic changes are present at the arch and at the origins the great vessels without significant stenosis. There is no aneurysm. Right carotid system: The right common carotid artery is within normal limits. Atherosclerotic calcifications are present at the right carotid bifurcation without a significant stenosis. Cervical right ICA is normal. Left carotid system: The left common carotid artery is within normal limits. Atherosclerotic calcifications are present at the left carotid bifurcation the lumen of the left ICA is narrowed 1.5 mm of over the course of 10 mm. The more distal cervical ICA is symmetric. Vertebral arteries: The vertebral arteries originate from the subclavian arteries bilaterally without significant stenosis. The right vertebral artery is slightly dominant left. There is no significant stenosis of either vertebral artery in the neck. Skeleton: Cervical spine is fused anteriorly at C4-5 and C5-6. There is slight listhesis above and below the fusion. No focal lytic or blastic lesions are present. Other neck: Bilateral subcentimeter thyroid nodules are present. There is no dominant nodule. No follow-up is necessary. Soft tissues the neck are otherwise unremarkable. Upper chest: The lung apices are clear.  Thoracic inlet is normal. Review of the MIP images confirms the above findings CTA HEAD FINDINGS Anterior circulation: Atherosclerotic calcifications present within the cavernous left internal carotid artery. The internal carotid arteries are otherwise within normal limits  from the skull base through the ICA termini bilaterally. The A1 and M1 segments are normal. There is mild narrowing of less than 50% in the proximal right M1 segment. Left M1 segment is within normal limits to the bifurcation. The right A1 segment is dominant. The anterior communicating artery is patent. There is segmental irregularity within the distal ACA and MCA branch vessels bilaterally without a significant proximal stenosis or occlusion. Posterior circulation: The right vertebral artery is dominant. PICA origins are visualized and normal. Basilar artery is normal. There is a high-grade stenosis of proximal left P1 segment. Moderate right P2 segment stenosis is present. Segmental irregularity is present within the distal PCA branches bilaterally. Venous sinuses: The dural sinuses are patent. The straight sinus deep cerebral veins are intact. Cortical veins are unremarkable. Anatomic variants: None Review of the MIP images confirms the above findings IMPRESSION: 1. Proximal right M1 segment stenosis of less than 50% relative to the more distal vessel. 2. No emergent large vessel occlusion. 3. High-grade stenoses of the proximal left P1 segment and more distal right P2 segment. 4. Diffuse distal small vessel disease throughout the circle-of-Willis. 5. Atherosclerotic changes at the aortic arch  right carotid artery bifurcation without significant stenosis. 6. 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 Electronically Signed   By: San Morelle M.D.   On: 01/27/2019 15:37   Mr Brain Wo Contrast  Result Date: 01/28/2019 CLINICAL DATA:  Stroke, follow-up additional history: Patient awoke last night with difficulty speaking and left-sided weakness. EXAM: MRI HEAD WITHOUT CONTRAST TECHNIQUE: Multiplanar, multiecho pulse sequences of the brain and surrounding structures were obtained without intravenous contrast. COMPARISON:  CT angiogram head/neck and non-contrast CT head 01/27/2019, brain MRI  11/02/2014 FINDINGS: Brain: Linear focus of restricted diffusion within the posterior limb of right internal capsule/basal ganglia consistent with acute infarct. There is no evidence of intracranial mass. No midline shift or extra-axial fluid collection. No chronic intracranial hemorrhage. Moderate scattered and confluent T2/FLAIR hyperintensity within the cerebral white matter is nonspecific, but consistent with chronic small vessel ischemic disease. Mild generalized parenchymal atrophy. Vascular: Flow voids maintained within the proximal large arterial vessels. Skull and upper cervical spine: Normal marrow signal. Sinuses/Orbits: Imaged globes and orbits demonstrate no acute abnormality. Mild mucosal thickening within the bilateral ethmoid and left sphenoid sinus. Small right maxillary sinus mucous retention cyst. No significant mastoid effusion. 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 These results will be called to the ordering clinician or representative by the Radiologist Assistant, and communication documented in the PACS or zVision Dashboard. Mild generalized parenchymal atrophy with moderate chronic small vessel ischemic disease. Electronically Signed   By: Kellie Simmering   On: 01/28/2019 10:21   Ct Head Code Stroke Wo Contrast  Result Date: 01/27/2019 CLINICAL DATA:  Code stroke. Left-sided weakness and slurred speech beginning today. Symptoms were present upon waking. Last seen normal at 10 p.m. last night. EXAM: CT HEAD WITHOUT CONTRAST TECHNIQUE: Contiguous axial images were obtained from the base of the skull through the vertex without intravenous contrast. COMPARISON:  CT head without contrast 08/12/2010. MR head without contrast 11/02/2014 and MR face 09/27/2018 FINDINGS: Brain: Extensive periventricular and subcortical white matter changes bilaterally are similar to previous studies. No acute infarct, hemorrhage, or mass lesion is present. Basal  ganglia are intact. Insular ribbon is normal. Gray-white differentiation is preserved. No acute or focal cortical lesions are evident. Vascular: Atherosclerotic calcifications are present within the cavernous internal carotid arteries bilaterally. There is no hyperdense vessel. Skull: Calvarium is intact. No focal lytic or blastic lesions are present. Sinuses/Orbits: The paranasal sinuses and mastoid air cells are clear. Bilateral lens replacements are noted. Globes and orbits are otherwise unremarkable. ASPECTS [facility] Stroke Program Early CT Score) - Ganglionic level infarction (caudate, lentiform nuclei, internal capsule, insula, M1-M3 cortex): 7/7 - Supraganglionic infarction (M4-M6 cortex): 3/3 Total score (0-10 with 10 being normal): 10/10 IMPRESSION: 1. No acute intracranial abnormality. 2. Stable appearance of chronic atrophy and white matter disease likely reflecting the sequela of chronic microvascular ischemia. 3. ASPECTS is 10/10 These results were called by telephone at the time of interpretation on 01/27/2019 at 2:50 pm to provider PHILLIP STAFFORD, MD , who verbally acknowledged these results. Electronically Signed   By: San Morelle M.D.   On: 01/27/2019 14:51    Review of Systems  Constitutional: Negative.   HENT: Negative.   Eyes: Positive for blurred vision.  Respiratory: Negative.   Cardiovascular: Negative.   Gastrointestinal: Negative.   Genitourinary: Negative.   Musculoskeletal: Negative.   Skin: Negative.   Neurological: Positive for dizziness, sensory change, speech change, focal weakness and weakness.  Endo/Heme/Allergies: Negative.  Psychiatric/Behavioral: Negative.    Blood pressure (!) 172/67, pulse 68, temperature (!) 97.5 F (36.4 C), temperature source Oral, resp. rate 20, height 5\' 5"  (1.651 m), weight 61.2 kg, SpO2 99 %. Physical Exam  Nursing note and vitals reviewed. Constitutional: She is oriented to person, place, and time. She appears  well-developed and well-nourished.  HENT:  Head: Normocephalic and atraumatic.  Eyes: Pupils are equal, round, and reactive to light. Conjunctivae and EOM are normal.  Neck: Normal range of motion. Neck supple.  Cardiovascular: Normal rate, regular rhythm and normal heart sounds.  Respiratory: Effort normal and breath sounds normal.  GI: Soft.  Musculoskeletal: Normal range of motion.  Neurological: She is alert and oriented to person, place, and time. She has normal reflexes. A cranial nerve deficit is present. Coordination abnormal.  Skin: Skin is warm and dry.  Psychiatric: She has a normal mood and affect.    Assessment/Plan: Preop for CEA Status post CVA Hypertension Hyperlipidemia . Plan Agree with admit for evaluation Agree with MRI evaluation for CVA Continue aspirin Plavix therapy Agree with statin therapy with Lipitor Echocardiogram basically unremarkable Patient appears to be an mild to moderate acceptable surgical risk for CEA procedure No significant intervention necessary to modify risk  Cadi Rhinehart D Zack Crager 01/29/2019, 9:00 AM

## 2019-02-11 ENCOUNTER — Ambulatory Visit (INDEPENDENT_AMBULATORY_CARE_PROVIDER_SITE_OTHER): Payer: Medicare Other | Admitting: Vascular Surgery

## 2019-02-19 DIAGNOSIS — E785 Hyperlipidemia, unspecified: Secondary | ICD-10-CM | POA: Insufficient documentation

## 2019-02-19 DIAGNOSIS — I6529 Occlusion and stenosis of unspecified carotid artery: Secondary | ICD-10-CM | POA: Insufficient documentation

## 2019-02-19 NOTE — Progress Notes (Signed)
MRN : [id number]  Kristina Banks is a 78 y.o. (May 15, 1941) female who presents with chief complaint of No chief complaint on file. Marland Kitchen  History of Present Illness:    The patient is seen for follow up evaluation of carotid stenosis.   The patient presented to the [facility] emergency department on 01/27/2019 with a chief complaint of slurred speech, difficulty walking and left-sided weakness which started yesterday a.m.  Patient was not a candidate for TPA administration being outside the window.  Patient's daughter notes that her mother who was in her usual state of health the evening before.  Patient states that her symptoms have resolved today.  Denies any slurred speech, left-sided weakness or inability to walk today.  CTA Neck 01/27/19: 1. Proximal right M1 segment stenosis of less than 50% relative to the more distal vessel. 2. No emergent large vessel occlusion. 3. High-grade stenoses of the proximal left P1 segment and more distal right P2 segment. 4. Diffuse distal small vessel disease throughout the circle-of-Willis. 5. Atherosclerotic changes at the aortic arch right carotid artery bifurcation without significant stenosis. 6. 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  Mild generalized parenchymal atrophy with moderate chronic small vessel ischemic disease.  The patient denies amaurosis fugax. There is no recent history of TIA symptoms or focal motor deficits. There is no prior documented CVA.  The patient is taking enteric-coated aspirin 325 mg daily because she can't take Plavix.  She is taking Crestor 10 mg daily because she did not tolerate Statins in the past.  There is no history of seizures.  The patient has a history of coronary artery disease, no recent episodes of angina or shortness of breath. The patient denies PAD or  claudication symptoms. There is a history of hyperlipidemia which is being treated with a statin.      Past Medical History:  Diagnosis Date   Arthritis    Basal cell carcinoma (BCC) of right cheek 01/03/2016   Change in hair    Complication of anesthesia    "takes a little longer to wake up"    DDD (degenerative disc disease), cervical    Discolored nails    TOENAILS   HBP (high blood pressure)    History of dysplastic nevus 03/13/2013   Hypertension    Muscle pain    PONV (postoperative nausea and vomiting)    nausea   Shortness of breath dyspnea    with exertion   Stroke ([facility]) 2006   tia   TIA (transient ischemic attack)     Past Surgical History:  Procedure Laterality Date   ABDOMINAL HYSTERECTOMY     ARM SURGERY Left    BONE TUMOR   ARTHOSCOPIC ROTAOR CUFF REPAIR Left 12/16/2014   Procedure: ARTHROSCOPIC ROTATOR CUFF REPAIR;  Surgeon: Earnestine Leys, MD;  Location: [facility] ORS;  Service: Orthopedics;  Laterality: Left;   bone tumor removed     Lt arm   CATARACT EXTRACTION W/PHACO Right 11/01/2015   Procedure: CATARACT EXTRACTION PHACO AND INTRAOCULAR LENS PLACEMENT (IOC);  Surgeon: Estill Cotta, MD;  Location: [facility] ORS;  Service: Ophthalmology;  Laterality: Right;  [place] 01:18 AP% 22.8 CDE 36.00 fluid pack lot # [id number]   CATARACT EXTRACTION W/PHACO Left 12/22/2015   Procedure: CATARACT EXTRACTION PHACO AND INTRAOCULAR LENS PLACEMENT (IOC);  Surgeon: Estill Cotta, MD;  Location: [facility] ORS;  Service: Ophthalmology;  Laterality: Left;  [place] 00:51 AP% 23.4 CDE 22.40 Fluid pack lot # [id number]   COLONOSCOPY     COLONOSCOPY WITH PROPOFOL N/A 04/18/2017   Procedure: COLONOSCOPY WITH PROPOFOL;  Surgeon: Manya Silvas, MD;  Location: [facility] ENDOSCOPY;  Service: Endoscopy;  Laterality: N/A;   FOOT SURGERY Right    10.3.14   FRACTURE SURGERY     fractured knee Left    LUMBAR LAMINECTOMY/DECOMPRESSION MICRODISCECTOMY Left 07/07/2015   Procedure:  Left Lumbar two-three Extraforaminal Microdiskectomy;  Surgeon: Leeroy Cha, MD;  Location: [facility] ORS;  Service: Neurosurgery;  Laterality: Left;  Left L2-3 Extraforaminal Microdiskectomy   TONSILLECTOMY      Social History Social History   Tobacco Use   Smoking status: Never Smoker   Smokeless tobacco: Never Used  Substance Use Topics   Alcohol use: No   Drug use: No    Family History No family history on file.  Allergies  Allergen Reactions   Codeine Nausea And Vomiting and Other (See Comments)    Hallucinations   Cephalexin Other (See Comments)    Dizziness, PATIENT DENIES   Oxycodone Other (See Comments)    Dizzy     REVIEW OF SYSTEMS (Negative unless checked)  Constitutional: [] Weight loss  [] Fever  [] Chills Cardiac: [] Chest pain   [] Chest pressure   [] Palpitations   [] Shortness of breath when laying flat   [] Shortness of breath with exertion. Vascular:  [] Pain in legs with walking   [] Pain in legs at rest  [] History of DVT   [] Phlebitis   [] Swelling in legs   [] Varicose veins   [] Non-healing ulcers Pulmonary:   [] Uses home oxygen   [] Productive cough   [] Hemoptysis   [] Wheeze  [] COPD   [] Asthma Neurologic:  [] Dizziness   [] Seizures   [x] History of stroke   [] History of TIA  [] Aphasia   [] Vissual changes   [] Weakness or numbness in arm   [] Weakness or numbness in leg Musculoskeletal:   [] Joint swelling   [] Joint pain   [] Low back pain Hematologic:  [] Easy bruising  [] Easy bleeding   [] Hypercoagulable state   [] Anemic Gastrointestinal:  [] Diarrhea   [] Vomiting  [] Gastroesophageal reflux/heartburn   [] Difficulty swallowing. Genitourinary:  [] Chronic kidney disease   [] Difficult urination  [] Frequent urination   [] Blood in urine Skin:  [] Rashes   [] Ulcers  Psychological:  [] History of anxiety   []  History of major depression.  Physical Examination  There were no vitals filed for this visit. There is no height or weight on file to calculate BMI. Gen:  WD/WN, NAD Head: [place]/AT, No temporalis wasting.  Ear/Nose/Throat: Hearing grossly intact, nares w/o erythema or drainage Eyes: PER, EOMI, sclera nonicteric.  Neck: Supple, no large masses.   Pulmonary:  Good air movement, no audible wheezing bilaterally, no use of accessory muscles.  Cardiac: RRR, no JVD Vascular: no bruits Vessel Right Left  Radial Palpable Palpable  Brachial Palpable Palpable  Carotid Palpable Palpable  Gastrointestinal: Non-distended. No guarding/no peritoneal signs.  Musculoskeletal: M/S 5/5 throughout.  No deformity or atrophy.  Neurologic: CN 2-12 intact. Symmetrical.  Speech is fluent. Motor exam as listed above. Psychiatric: Judgment intact, Mood & affect appropriate for pt's clinical situation. Dermatologic: No rashes or ulcers noted.  No changes consistent with cellulitis. Lymph : No lichenification or skin changes of chronic lymphedema.  CBC Lab Results  Component Value Date   WBC 15.0 (H) 01/27/2019   HGB 14.5 01/27/2019   HCT 42.9 01/27/2019   MCV 91.5 01/27/2019   PLT 291  01/27/2019    BMET    Component Value Date/Time   NA 137 01/27/2019 1444   K 3.7 01/27/2019 1444   CL 105 01/27/2019 1444   CO2 22 01/27/2019 1444   GLUCOSE 151 (H) 01/27/2019 1444   BUN 29 (H) 01/27/2019 1444   CREATININE 0.82 01/27/2019 1444   CALCIUM 9.6 01/27/2019 1444   GFRNONAA >60 01/27/2019 1444   GFRAA >60 01/27/2019 1444   CrCl cannot be calculated (Patient's most recent lab result is older than the maximum 21 days allowed.).  COAG Lab Results  Component Value Date   INR 0.9 01/27/2019    Radiology Ct Code Stroke Cta Head W/Banks Contrast  Result Date: 01/27/2019 CLINICAL DATA:  New onset of slurred speech and left-sided weakness upon waking today. Last seen normal at 10 o'clock last night. EXAM: CT ANGIOGRAPHY HEAD AND NECK TECHNIQUE: Multidetector CT imaging of the head and neck was performed using the standard protocol during bolus administration of  intravenous contrast. Multiplanar CT image reconstructions and MIPs were obtained to evaluate the vascular anatomy. Carotid stenosis measurements (when applicable) are obtained utilizing NASCET criteria, using the distal internal carotid diameter as the denominator. CONTRAST:  39mL OMNIPAQUE IOHEXOL 350 MG/ML SOLN COMPARISON:  CT head without contrast 01/27/2019. Kristina face without and with contrast 09/27/2018. Kristina head without contrast 11/02/2014. FINDINGS: CTA NECK FINDINGS Aortic arch: A 3 vessel arch configuration is present. Atherosclerotic changes are present at the arch and at the origins the great vessels without significant stenosis. There is no aneurysm. Right carotid system: The right common carotid artery is within normal limits. Atherosclerotic calcifications are present at the right carotid bifurcation without a significant stenosis. Cervical right ICA is normal. Left carotid system: The left common carotid artery is within normal limits. Atherosclerotic calcifications are present at the left carotid bifurcation the lumen of the left ICA is narrowed 1.5 mm of over the course of 10 mm. The more distal cervical ICA is symmetric. Vertebral arteries: The vertebral arteries originate from the subclavian arteries bilaterally without significant stenosis. The right vertebral artery is slightly dominant left. There is no significant stenosis of either vertebral artery in the neck. Skeleton: Cervical spine is fused anteriorly at C4-5 and C5-6. There is slight listhesis above and below the fusion. No focal lytic or blastic lesions are present. Other neck: Bilateral subcentimeter thyroid nodules are present. There is no dominant nodule. No follow-up is necessary. Soft tissues the neck are otherwise unremarkable. Upper chest: The lung apices are clear.  Thoracic inlet is normal. Review of the MIP images confirms the above findings CTA HEAD FINDINGS Anterior circulation: Atherosclerotic calcifications present within  the cavernous left internal carotid artery. The internal carotid arteries are otherwise within normal limits from the skull base through the ICA termini bilaterally. The A1 and M1 segments are normal. There is mild narrowing of less than 50% in the proximal right M1 segment. Left M1 segment is within normal limits to the bifurcation. The right A1 segment is dominant. The anterior communicating artery is patent. There is segmental irregularity within the distal ACA and MCA branch vessels bilaterally without a significant proximal stenosis or occlusion. Posterior circulation: The right vertebral artery is dominant. PICA origins are visualized and normal. Basilar artery is normal. There is a high-grade stenosis of proximal left P1 segment. Moderate right P2 segment stenosis is present. Segmental irregularity is present within the distal PCA branches bilaterally. Venous sinuses: The dural sinuses are patent. The straight sinus deep cerebral veins are  intact. Cortical veins are unremarkable. Anatomic variants: None Review of the MIP images confirms the above findings IMPRESSION: 1. Proximal right M1 segment stenosis of less than 50% relative to the more distal vessel. 2. No emergent large vessel occlusion. 3. High-grade stenoses of the proximal left P1 segment and more distal right P2 segment. 4. Diffuse distal small vessel disease throughout the circle-of-Willis. 5. Atherosclerotic changes at the aortic arch right carotid artery bifurcation without significant stenosis. 6. 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 Electronically Signed   By: San Morelle M.D.   On: 01/27/2019 15:37   Ct Code Stroke Cta Neck W/Banks Contrast  Result Date: 01/27/2019 CLINICAL DATA:  New onset of slurred speech and left-sided weakness upon waking today. Last seen normal at 10 o'clock last night. EXAM: CT ANGIOGRAPHY HEAD AND NECK TECHNIQUE: Multidetector CT imaging of the head and neck was performed using the standard  protocol during bolus administration of intravenous contrast. Multiplanar CT image reconstructions and MIPs were obtained to evaluate the vascular anatomy. Carotid stenosis measurements (when applicable) are obtained utilizing NASCET criteria, using the distal internal carotid diameter as the denominator. CONTRAST:  61mL OMNIPAQUE IOHEXOL 350 MG/ML SOLN COMPARISON:  CT head without contrast 01/27/2019. Kristina face without and with contrast 09/27/2018. Kristina head without contrast 11/02/2014. FINDINGS: CTA NECK FINDINGS Aortic arch: A 3 vessel arch configuration is present. Atherosclerotic changes are present at the arch and at the origins the great vessels without significant stenosis. There is no aneurysm. Right carotid system: The right common carotid artery is within normal limits. Atherosclerotic calcifications are present at the right carotid bifurcation without a significant stenosis. Cervical right ICA is normal. Left carotid system: The left common carotid artery is within normal limits. Atherosclerotic calcifications are present at the left carotid bifurcation the lumen of the left ICA is narrowed 1.5 mm of over the course of 10 mm. The more distal cervical ICA is symmetric. Vertebral arteries: The vertebral arteries originate from the subclavian arteries bilaterally without significant stenosis. The right vertebral artery is slightly dominant left. There is no significant stenosis of either vertebral artery in the neck. Skeleton: Cervical spine is fused anteriorly at C4-5 and C5-6. There is slight listhesis above and below the fusion. No focal lytic or blastic lesions are present. Other neck: Bilateral subcentimeter thyroid nodules are present. There is no dominant nodule. No follow-up is necessary. Soft tissues the neck are otherwise unremarkable. Upper chest: The lung apices are clear.  Thoracic inlet is normal. Review of the MIP images confirms the above findings CTA HEAD FINDINGS Anterior circulation:  Atherosclerotic calcifications present within the cavernous left internal carotid artery. The internal carotid arteries are otherwise within normal limits from the skull base through the ICA termini bilaterally. The A1 and M1 segments are normal. There is mild narrowing of less than 50% in the proximal right M1 segment. Left M1 segment is within normal limits to the bifurcation. The right A1 segment is dominant. The anterior communicating artery is patent. There is segmental irregularity within the distal ACA and MCA branch vessels bilaterally without a significant proximal stenosis or occlusion. Posterior circulation: The right vertebral artery is dominant. PICA origins are visualized and normal. Basilar artery is normal. There is a high-grade stenosis of proximal left P1 segment. Moderate right P2 segment stenosis is present. Segmental irregularity is present within the distal PCA branches bilaterally. Venous sinuses: The dural sinuses are patent. The straight sinus deep cerebral veins are intact. Cortical veins are  unremarkable. Anatomic variants: None Review of the MIP images confirms the above findings IMPRESSION: 1. Proximal right M1 segment stenosis of less than 50% relative to the more distal vessel. 2. No emergent large vessel occlusion. 3. High-grade stenoses of the proximal left P1 segment and more distal right P2 segment. 4. Diffuse distal small vessel disease throughout the circle-of-Willis. 5. Atherosclerotic changes at the aortic arch right carotid artery bifurcation without significant stenosis. 6. 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 Electronically Signed   By: San Morelle M.D.   On: 01/27/2019 15:37   Kristina Banks Contrast  Result Date: 01/28/2019 CLINICAL DATA:  Stroke, follow-up additional history: Patient awoke last night with difficulty speaking and left-sided weakness. EXAM: MRI HEAD WITHOUT CONTRAST TECHNIQUE: Multiplanar, multiecho pulse sequences of the brain and  surrounding structures were obtained without intravenous contrast. COMPARISON:  CT angiogram head/neck and non-contrast CT head 01/27/2019, brain MRI 11/02/2014 FINDINGS: Brain: Linear focus of restricted diffusion within the posterior limb of right internal capsule/basal ganglia consistent with acute infarct. There is no evidence of intracranial mass. No midline shift or extra-axial fluid collection. No chronic intracranial hemorrhage. Moderate scattered and confluent T2/FLAIR hyperintensity within the cerebral white matter is nonspecific, but consistent with chronic small vessel ischemic disease. Mild generalized parenchymal atrophy. Vascular: Flow voids maintained within the proximal large arterial vessels. Skull and upper cervical spine: Normal marrow signal. Sinuses/Orbits: Imaged globes and orbits demonstrate no acute abnormality. Mild mucosal thickening within the bilateral ethmoid and left sphenoid sinus. Small right maxillary sinus mucous retention cyst. No significant mastoid effusion. 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 These results will be called to the ordering clinician or representative by the Radiologist Assistant, and communication documented in the PACS or zVision Dashboard. Mild generalized parenchymal atrophy with moderate chronic small vessel ischemic disease. Electronically Signed   By: Kellie Simmering   On: 01/28/2019 10:21   Ct Head Code Stroke Banks Contrast  Result Date: 01/27/2019 CLINICAL DATA:  Code stroke. Left-sided weakness and slurred speech beginning today. Symptoms were present upon waking. Last seen normal at 10 p.m. last night. EXAM: CT HEAD WITHOUT CONTRAST TECHNIQUE: Contiguous axial images were obtained from the base of the skull through the vertex without intravenous contrast. COMPARISON:  CT head without contrast 08/12/2010. Kristina head without contrast 11/02/2014 and Kristina face 09/27/2018 FINDINGS: Brain: Extensive  periventricular and subcortical white matter changes bilaterally are similar to previous studies. No acute infarct, hemorrhage, or mass lesion is present. Basal ganglia are intact. Insular ribbon is normal. Gray-white differentiation is preserved. No acute or focal cortical lesions are evident. Vascular: Atherosclerotic calcifications are present within the cavernous internal carotid arteries bilaterally. There is no hyperdense vessel. Skull: Calvarium is intact. No focal lytic or blastic lesions are present. Sinuses/Orbits: The paranasal sinuses and mastoid air cells are clear. Bilateral lens replacements are noted. Globes and orbits are otherwise unremarkable. ASPECTS [facility] Stroke Program Early CT Score) - Ganglionic level infarction (caudate, lentiform nuclei, internal capsule, insula, M1-M3 cortex): 7/7 - Supraganglionic infarction (M4-M6 cortex): 3/3 Total score (0-10 with 10 being normal): 10/10 IMPRESSION: 1. No acute intracranial abnormality. 2. Stable appearance of chronic atrophy and white matter disease likely reflecting the sequela of chronic microvascular ischemia. 3. ASPECTS is 10/10 These results were called by telephone at the time of interpretation on 01/27/2019 at 2:50 pm to provider PHILLIP STAFFORD, MD , who verbally acknowledged these results. Electronically Signed   By: Harrell Gave  Mattern M.D.   On: 01/27/2019 14:51     Assessment/Plan 1. Bilateral carotid artery stenosis Recommend:  Given the patient's asymptomatic subcritical stenosis no further invasive testing or surgery at this time.  Duplex ultrasound shows 123456 LICA [id number] stenosis.  Continue antiplatelet therapy as prescribed Continue management of CAD, HTN and Hyperlipidemia Healthy heart diet,  encouraged exercise at least 4 times per week Follow up in 6 months with duplex ultrasound and physical exam   2. 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 Essential hypertension Continue antihypertensive medications as already ordered, these medications have been reviewed and there are no changes at this time.   4. Mixed hyperlipidemia Continue statin as ordered and reviewed, no changes at this time     Hortencia Pilar, MD  02/19/2019 9:26 AM

## 2019-02-20 ENCOUNTER — Other Ambulatory Visit: Payer: Self-pay

## 2019-02-20 ENCOUNTER — Ambulatory Visit (INDEPENDENT_AMBULATORY_CARE_PROVIDER_SITE_OTHER): Payer: Medicare Other | Admitting: Vascular Surgery

## 2019-02-20 ENCOUNTER — Encounter (INDEPENDENT_AMBULATORY_CARE_PROVIDER_SITE_OTHER): Payer: Self-pay | Admitting: Vascular Surgery

## 2019-02-20 VITALS — BP 174/80 | HR 69 | Resp 16 | Wt 135.0 lb

## 2019-02-20 DIAGNOSIS — I639 Cerebral infarction, unspecified: Secondary | ICD-10-CM

## 2019-02-20 DIAGNOSIS — I6523 Occlusion and stenosis of bilateral carotid arteries: Secondary | ICD-10-CM | POA: Diagnosis not present

## 2019-02-20 DIAGNOSIS — S82009A Unspecified fracture of unspecified patella, initial encounter for closed fracture: Secondary | ICD-10-CM | POA: Insufficient documentation

## 2019-02-20 DIAGNOSIS — E782 Mixed hyperlipidemia: Secondary | ICD-10-CM | POA: Diagnosis not present

## 2019-02-20 DIAGNOSIS — S40019A Contusion of unspecified shoulder, initial encounter: Secondary | ICD-10-CM | POA: Insufficient documentation

## 2019-02-20 DIAGNOSIS — M25669 Stiffness of unspecified knee, not elsewhere classified: Secondary | ICD-10-CM | POA: Insufficient documentation

## 2019-02-20 DIAGNOSIS — S93409A Sprain of unspecified ligament of unspecified ankle, initial encounter: Secondary | ICD-10-CM | POA: Insufficient documentation

## 2019-02-20 DIAGNOSIS — R269 Unspecified abnormalities of gait and mobility: Secondary | ICD-10-CM | POA: Insufficient documentation

## 2019-02-20 DIAGNOSIS — M75100 Unspecified rotator cuff tear or rupture of unspecified shoulder, not specified as traumatic: Secondary | ICD-10-CM | POA: Insufficient documentation

## 2019-02-20 DIAGNOSIS — I10 Essential (primary) hypertension: Secondary | ICD-10-CM | POA: Diagnosis not present

## 2019-02-20 DIAGNOSIS — M25519 Pain in unspecified shoulder: Secondary | ICD-10-CM | POA: Insufficient documentation

## 2019-02-20 DIAGNOSIS — I63449 Cerebral infarction due to embolism of unspecified cerebellar artery: Secondary | ICD-10-CM

## 2019-02-20 DIAGNOSIS — M7540 Impingement syndrome of unspecified shoulder: Secondary | ICD-10-CM | POA: Insufficient documentation

## 2019-02-20 DIAGNOSIS — M7070 Other bursitis of hip, unspecified hip: Secondary | ICD-10-CM | POA: Insufficient documentation

## 2019-08-21 ENCOUNTER — Encounter: Payer: Self-pay | Admitting: Dermatology

## 2019-08-21 ENCOUNTER — Other Ambulatory Visit: Payer: Self-pay

## 2019-08-21 ENCOUNTER — Ambulatory Visit (INDEPENDENT_AMBULATORY_CARE_PROVIDER_SITE_OTHER): Payer: Medicare Other | Admitting: Dermatology

## 2019-08-21 DIAGNOSIS — Z85828 Personal history of other malignant neoplasm of skin: Secondary | ICD-10-CM

## 2019-08-21 DIAGNOSIS — L578 Other skin changes due to chronic exposure to nonionizing radiation: Secondary | ICD-10-CM | POA: Diagnosis not present

## 2019-08-21 DIAGNOSIS — Z9889 Other specified postprocedural states: Secondary | ICD-10-CM

## 2019-08-21 DIAGNOSIS — D692 Other nonthrombocytopenic purpura: Secondary | ICD-10-CM

## 2019-08-21 DIAGNOSIS — Z1283 Encounter for screening for malignant neoplasm of skin: Secondary | ICD-10-CM | POA: Diagnosis not present

## 2019-08-21 DIAGNOSIS — D1801 Hemangioma of skin and subcutaneous tissue: Secondary | ICD-10-CM

## 2019-08-21 DIAGNOSIS — L821 Other seborrheic keratosis: Secondary | ICD-10-CM

## 2019-08-21 DIAGNOSIS — D229 Melanocytic nevi, unspecified: Secondary | ICD-10-CM

## 2019-08-21 DIAGNOSIS — S81811D Laceration without foreign body, right lower leg, subsequent encounter: Secondary | ICD-10-CM

## 2019-08-21 DIAGNOSIS — L814 Other melanin hyperpigmentation: Secondary | ICD-10-CM

## 2019-08-21 DIAGNOSIS — L820 Inflamed seborrheic keratosis: Secondary | ICD-10-CM

## 2019-08-21 NOTE — Progress Notes (Signed)
   Follow-Up Visit   Subjective  Kristina Banks is a 79 y.o. female who presents for the following: Annual Exam (Patient has noticed scattered lesions on her trunk and extremities that are itchy and irritated). Patient presents for total body skin exam for skin cancer screening.  She has history of skin cancer treated in the past.  The following portions of the chart were reviewed this encounter and updated as appropriate: Tobacco  Allergies  Meds  Problems  Med Hx  Surg Hx  Fam Hx     Review of Systems: No other skin or systemic complaints.  Objective  Well appearing patient in no apparent distress; mood and affect are within normal limits.  A full examination was performed including scalp, head, eyes, ears, nose, lips, neck, chest, axillae, abdomen, back, buttocks, bilateral upper extremities, bilateral lower extremities, hands, feet, fingers, toes, fingernails, and toenails. All findings within normal limits unless otherwise noted below.  Objective  L elbow: Clear  Objective  Trunk and extremities x 18 (18): Erythematous keratotic or waxy stuck-on papule or plaque.   Objective  R lower leg: Healing laceration  Assessment & Plan  History of removal of cyst L elbow  Benign, observe.    Inflamed seborrheic keratosis (18) Trunk and extremities x 18  Destruction of lesion - Trunk and extremities x 18 Complexity: simple   Destruction method: cryotherapy   Informed consent: discussed and consent obtained   Timeout:  patient name, date of birth, surgical site, and procedure verified Lesion destroyed using liquid nitrogen: Yes   Region frozen until ice ball extended beyond lesion: Yes   Outcome: patient tolerated procedure well with no complications   Post-procedure details: wound care instructions given    Laceration of right lower leg, subsequent encounter R lower leg  Continue Mupirocin 2% oint daily until healed   Seborrheic Keratoses - Stuck-on, waxy,  tan-brown papules and plaques  - Discussed benign etiology and prognosis. - Observe - Call for any changes  Actinic Damage - diffuse scaly erythematous macules with underlying dyspigmentation - Recommend daily broad spectrum sunscreen SPF 30+ to sun-exposed areas, reapply every 2 hours as needed.  - Call for new or changing lesions.  Hemangiomas - Red papules - Discussed benign nature - Observe - Call for any changes  Melanocytic Nevi - Tan-brown and/or pink-flesh-colored symmetric macules and papules - Benign appearing on exam today - Observation - Call clinic for new or changing moles - Recommend daily use of broad spectrum spf 30+ sunscreen to sun-exposed areas.   Lentigines - Scattered tan macules - Discussed due to sun exposure - Benign, observe - Call for any changes  Purpura - caused by aspirin use for CVA, and actinic damage - Violaceous macules and patches - Benign - Related to age, sun damage and/or use of blood thinners - Observe - Can use OTC arnica containing moisturizer such as Dermend Bruise Formula if desired - Call for worsening or other concerns  History of Basal Cell Carcinoma of the Skin - No evidence of recurrence today - Recommend regular full body skin exams - Recommend daily broad spectrum sunscreen SPF 30+ to sun-exposed areas, reapply every 2 hours as needed.  - Call if any new or changing lesions are noted between office visits  Return in about 1 year (around 08/20/2020) for TBSE.   Luther Redo, CMA, am acting as scribe for Sarina Ser, MD .

## 2019-08-25 ENCOUNTER — Other Ambulatory Visit: Payer: Self-pay

## 2019-08-25 ENCOUNTER — Encounter (INDEPENDENT_AMBULATORY_CARE_PROVIDER_SITE_OTHER): Payer: Self-pay | Admitting: Vascular Surgery

## 2019-08-25 ENCOUNTER — Ambulatory Visit (INDEPENDENT_AMBULATORY_CARE_PROVIDER_SITE_OTHER): Payer: Medicare Other

## 2019-08-25 ENCOUNTER — Ambulatory Visit (INDEPENDENT_AMBULATORY_CARE_PROVIDER_SITE_OTHER): Payer: Medicare Other | Admitting: Vascular Surgery

## 2019-08-25 VITALS — BP 168/80 | HR 60 | Ht 66.0 in | Wt 129.0 lb

## 2019-08-25 DIAGNOSIS — I6523 Occlusion and stenosis of bilateral carotid arteries: Secondary | ICD-10-CM

## 2019-08-25 DIAGNOSIS — I10 Essential (primary) hypertension: Secondary | ICD-10-CM | POA: Diagnosis not present

## 2019-08-25 DIAGNOSIS — M50321 Other cervical disc degeneration at C4-C5 level: Secondary | ICD-10-CM

## 2019-08-25 DIAGNOSIS — E782 Mixed hyperlipidemia: Secondary | ICD-10-CM

## 2019-08-25 DIAGNOSIS — M461 Sacroiliitis, not elsewhere classified: Secondary | ICD-10-CM | POA: Insufficient documentation

## 2019-08-25 DIAGNOSIS — R29898 Other symptoms and signs involving the musculoskeletal system: Secondary | ICD-10-CM | POA: Insufficient documentation

## 2019-08-25 NOTE — Progress Notes (Signed)
MRN : [id number]  Kristina Banks is a 79 y.o. ([birth year]) female who presents with chief complaint of  Chief Complaint  Patient presents with  . Follow-up    U/S Follow up  .  History of Present Illness:   The patient is seen for follow up evaluation of carotid stenosis.   The patient denies amaurosis fugax. There is no recent history of TIA symptoms or focal motor deficits. There is no prior documented CVA.  The patient is taking enteric-coated aspirin 325 mg daily because she can't take Plavix.  She is taking Crestor 10 mg daily because she did not tolerate Statins in the past.  There is no history of seizures.  The patient presented to the [facility] on 01/27/2019 with a chief complaint of slurred speech, difficulty walking and left-sided weakness which started yesterday a.m.  Patient was not a candidate for TPA administration being outside the window. Patient's daughter notes that her mother who was in her usual state of health the evening before. Patient states that her symptoms have resolved today. Denies any slurred speech, left-sided weakness or inability to walk today.  CTA Neck 01/27/19: 1. Proximal right M1 segment stenosis of less than 50% relative to the more distal vessel. 2. No emergent large vessel occlusion. 3. High-grade stenoses of the proximal left P1 segment and more distal right P2 segment. 4. Diffuse distal small vessel disease throughout the circle-of-Willis. 5. Atherosclerotic changes at the aortic arch right carotid artery bifurcation without significant stenosis. 6. 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  Mild generalized parenchymal atrophy with moderate chronic small vessel ischemic disease.  The patient has a history of coronary artery disease, no recent episodes of angina or shortness of breath. The  patient denies PAD or claudication symptoms. There is a history of hyperlipidemia which is being treated with a statin.   Duplex ultrasound of the carotid arteries obtained today demonstrates the right ICA 1 to 39% stenosis and the left ICA 40 to 59% stenosis  Current Meds  Medication Sig  . ALPRAZolam (XANAX) 0.25 MG tablet TAKE ONE TABLET BY MOUTH AT BEDTIME AS NEEDED FOR SLEEP  . amLODipine (NORVASC) 5 MG tablet Take 5 mg by mouth daily.  Marland Kitchen aspirin 325 MG tablet Take 325 mg by mouth daily.  . Biotin 5000 MCG CAPS Take by mouth daily.  . Calcium Carbonate-Vitamin D (CALCIUM 600 + D PO) Take 1 tablet by mouth daily.   . Calcium Carbonate-Vitamin D 600-200 MG-UNIT TABS Take by mouth.  . meloxicam (MOBIC) 15 MG tablet Take 1 tablet (15 mg total) by mouth daily.  . mupirocin ointment (BACTROBAN) 2 % mupirocin 2 % topical ointment  . olmesartan (BENICAR) 20 MG tablet Take 20 mg by mouth daily.  . rosuvastatin (CRESTOR) 10 MG tablet Take 1 tablet (10 mg total) by mouth daily at 6 PM.  . UNABLE TO FIND 20 mg. dimesartan medeomil    Past Medical History:  Diagnosis Date  . Actinic keratosis   . Arthritis   . Basal cell carcinoma (BCC) of right cheek 01/03/2016  . Change in hair   . Complication of anesthesia    "takes a little longer to wake up"   . DDD (degenerative disc disease), cervical   . Discolored nails    TOENAILS  . HBP (high blood pressure)   . History of dysplastic  nevus 03/13/2013   Left inf. lat. buttock, left lat. hip. Mild atypia, edges free  . Hypertension   . Muscle pain   . PONV (postoperative nausea and vomiting)    nausea  . Shortness of breath dyspnea    with exertion  . 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 Stroke [facility]) 2006   tia  . TIA (transient ischemic attack)     Past Surgical History:  Procedure Laterality Date  . ABDOMINAL HYSTERECTOMY    . ARM SURGERY Left    BONE  TUMOR  . ARTHOSCOPIC ROTAOR CUFF REPAIR Left 12/16/2014   Procedure: ARTHROSCOPIC ROTATOR CUFF REPAIR;  Surgeon: Earnestine Leys, MD;  Location: [facility] ORS;  Service: Orthopedics;  Laterality: Left;  . bone tumor removed     Lt arm  . CATARACT EXTRACTION W/PHACO Right 11/01/2015   Procedure: CATARACT EXTRACTION PHACO AND INTRAOCULAR LENS PLACEMENT (IOC);  Surgeon: Estill Cotta, MD;  Location: [facility] ORS;  Service: Ophthalmology;  Laterality: Right;  [place] 01:18 AP% 22.8 CDE 36.00 fluid pack lot # [id number]  . CATARACT EXTRACTION W/PHACO Left 12/22/2015   Procedure: CATARACT EXTRACTION PHACO AND INTRAOCULAR LENS PLACEMENT (IOC);  Surgeon: Estill Cotta, MD;  Location: [facility] ORS;  Service: Ophthalmology;  Laterality: Left;  [place] 00:51 AP% 23.4 CDE 22.40 Fluid pack lot # [id number]  . COLONOSCOPY    . COLONOSCOPY WITH PROPOFOL N/A 04/18/2017   Procedure: COLONOSCOPY WITH PROPOFOL;  Surgeon: Manya Silvas, MD;  Location: [facility] ENDOSCOPY;  Service: Endoscopy;  Laterality: N/A;  . FOOT SURGERY Right    10.3.14  . FRACTURE SURGERY    . fractured knee Left   . LUMBAR LAMINECTOMY/DECOMPRESSION MICRODISCECTOMY Left 07/07/2015   Procedure: Left Lumbar two-three Extraforaminal Microdiskectomy;  Surgeon: Leeroy Cha, MD;  Location: [facility] ORS;  Service: Neurosurgery;  Laterality: Left;  Left L2-3 Extraforaminal Microdiskectomy  . TONSILLECTOMY      Social History Social History   Tobacco Use  . Smoking status: Never Smoker  . Smokeless tobacco: Never Used  Substance Use Topics  . Alcohol use: No  . Drug use: No    Family History Family History  Problem Relation Age of Onset  . Varicose Veins Neg Hx     Allergies  Allergen Reactions  . Codeine Nausea And Vomiting and Other (See Comments)    Hallucinations  . Other   . Tramadol Other (See Comments)    Agitation   . Cephalexin Other (See Comments)    Dizziness, PATIENT DENIES  . Oxycodone Other (See Comments)    Dizzy     REVIEW  OF SYSTEMS (Negative unless checked)  Constitutional: [] Weight loss  [] Fever  [] Chills Cardiac: [] Chest pain   [] Chest pressure   [] Palpitations   [] Shortness of breath when laying flat   [] Shortness of breath with exertion. Vascular:  [] Pain in legs with walking   [] Pain in legs at rest  [] History of DVT   [] Phlebitis   [] Swelling in legs   [] Varicose veins   [] Non-healing ulcers Pulmonary:   [] Uses home oxygen   [] Productive cough   [] Hemoptysis   [] Wheeze  [] COPD   [] Asthma Neurologic:  [] Dizziness   [] Seizures   [x] History of stroke   [] History of TIA  [] Aphasia   [] Vissual changes   [] Weakness or numbness in arm   [] Weakness or numbness in leg Musculoskeletal:   [] Joint swelling   [x] Joint pain   [] Low back pain Hematologic:  [] Easy bruising  []   Easy bleeding   [] Hypercoagulable state   [] Anemic Gastrointestinal:  [] Diarrhea   [] Vomiting  [] Gastroesophageal reflux/heartburn   [] Difficulty swallowing. Genitourinary:  [] Chronic kidney disease   [] Difficult urination  [] Frequent urination   [] Blood in urine Skin:  [] Rashes   [] Ulcers  Psychological:  [] History of anxiety   []  History of major depression.  Physical Examination  Vitals:   08/25/19 1001  BP: (!) 168/80  Pulse: 60  Weight: 129 lb (58.5 kg)  Height: 5\' 6"  (1.676 m)   Body mass index is 20.82 kg/m. Gen: WD/WN, NAD Head: IXL/AT, No temporalis wasting.  Ear/Nose/Throat: Hearing grossly intact, nares w/o erythema or drainage Eyes: PER, EOMI, sclera nonicteric.  Neck: Supple, no large masses.   Pulmonary:  Good air movement, no audible wheezing bilaterally, no use of accessory muscles.  Cardiac: RRR, no JVD Vascular:  No carotid bruits auscultated Vessel Right Left  Radial Palpable Palpable  Brachial Palpable Palpable  Carotid Palpable Palpable  Gastrointestinal: Non-distended. No guarding/no peritoneal signs.  Musculoskeletal: M/S 5/5 throughout.  No deformity or atrophy.  Neurologic: CN 2-12 intact. Symmetrical.   Speech is fluent. Motor exam as listed above. Psychiatric: Judgment intact, Mood & affect appropriate for pt's clinical situation. Dermatologic: No rashes or ulcers noted.  No changes consistent with cellulitis.  CBC Lab Results  Component Value Date   WBC 15.0 (H) 01/27/2019   HGB 14.5 01/27/2019   HCT 42.9 01/27/2019   MCV 91.5 01/27/2019   PLT 291 01/27/2019    BMET    Component Value Date/Time   NA 137 01/27/2019 1444   K 3.7 01/27/2019 1444   CL 105 01/27/2019 1444   CO2 22 01/27/2019 1444   GLUCOSE 151 (H) 01/27/2019 1444   BUN 29 (H) 01/27/2019 1444   CREATININE 0.82 01/27/2019 1444   CALCIUM 9.6 01/27/2019 1444   GFRNONAA >60 01/27/2019 1444   GFRAA >60 01/27/2019 1444   CrCl cannot be calculated (Patient's most recent lab result is older than the maximum 21 days allowed.).  COAG Lab Results  Component Value Date   INR 0.9 01/27/2019    Radiology No results found.    Assessment/Plan 1. Bilateral carotid artery stenosis Recommend:  Given the patient's asymptomatic subcritical stenosis no further invasive testing or surgery at this time.  Duplex ultrasound of the carotid arteries obtained today demonstrates the right ICA 1 to 39% stenosis and the left ICA 40 to 59% stenosis  Continue antiplatelet therapy as prescribed Continue management of CAD, HTN and Hyperlipidemia Healthy heart diet,  encouraged exercise at least 4 times per week.  Follow up in 6 months with duplex ultrasound and physical exam   - VAS US CAROTID; Future  2. Benign essential hypertension Continue antihypertensive medications as already ordered, these medications have been reviewed and there are no changes at this time.   3. Mixed hyperlipidemia Continue statin as ordered and reviewed, no changes at this time   4. 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  Continued activity and therapy was stressed.    Hortencia Pilar, MD  08/25/2019 10:16 AM

## 2019-08-26 ENCOUNTER — Encounter (INDEPENDENT_AMBULATORY_CARE_PROVIDER_SITE_OTHER): Payer: Self-pay | Admitting: Vascular Surgery

## 2019-11-12 ENCOUNTER — Other Ambulatory Visit: Payer: Self-pay

## 2019-11-12 ENCOUNTER — Ambulatory Visit (INDEPENDENT_AMBULATORY_CARE_PROVIDER_SITE_OTHER): Payer: Medicare Other | Admitting: Dermatology

## 2019-11-12 DIAGNOSIS — L905 Scar conditions and fibrosis of skin: Secondary | ICD-10-CM | POA: Diagnosis not present

## 2019-11-12 DIAGNOSIS — S8981XA Other specified injuries of right lower leg, initial encounter: Secondary | ICD-10-CM

## 2019-11-12 DIAGNOSIS — I6523 Occlusion and stenosis of bilateral carotid arteries: Secondary | ICD-10-CM

## 2019-11-12 DIAGNOSIS — T1490XA Injury, unspecified, initial encounter: Secondary | ICD-10-CM

## 2019-11-12 MED ORDER — DOXYCYCLINE HYCLATE 100 MG PO TABS: 100.0000 mg | ORAL_TABLET | Freq: Two times a day (BID) | ORAL | 0 refills | Status: AC

## 2019-11-12 NOTE — Progress Notes (Signed)
   Follow-Up Visit   Subjective  Kristina Banks is a 79 y.o. female who presents for the following: traumatic injury (R lower leg, ~22m, mupirocin qd) and check elbow (L elbow, painful with pressure).  The following portions of the chart were reviewed this encounter and updated as appropriate:  Tobacco  Allergies  Meds  Problems  Med Hx  Surg Hx  Fam Hx     Review of Systems:  No other skin or systemic complaints except as noted in HPI or Assessment and Plan.  Objective  Well appearing patient in no apparent distress; mood and affect are within normal limits.  A focused examination was performed including R lower leg, L elbow. Relevant physical exam findings are noted in the Assessment and Plan.  Objective  Right Lower Leg - Anterior: Traumatic injury  Objective  Left Elbow - Posterior: scar   Assessment & Plan    Traumatic injury Right Lower Leg - Anterior  Recommend Puracyn or Cln body wash to clean daily, then mupirocin until healed  If worsens start Doxycycline 100mg  1 po bid x 10 days, take with food an drink  Doxycycline should be taken with food to prevent nausea. Do not lay down for 30 minutes after taking. Be cautious with sun exposure and use good sun protection while on this medication. Pregnant women should not take this medication.    Wound cleansed with Puracyn, minimal debridement, then mupirocin, telfa and coban.  doxycycline (VIBRA-TABS) 100 MG tablet - Right Lower Leg - Anterior  Scar Left Elbow - Posterior  With hx of Pain  Discussed excising, will plan surgery if pt desires  Return for PRN.   I, Othelia Pulling, RMA, am acting as scribe for Sarina Ser, MD .  Documentation: I have reviewed the above documentation for accuracy and completeness, and I agree with the above.  Sarina Ser, MD

## 2019-11-12 NOTE — Patient Instructions (Addendum)
Puracyn to clean wound

## 2019-11-24 ENCOUNTER — Encounter: Payer: Self-pay | Admitting: Dermatology

## 2020-02-19 ENCOUNTER — Other Ambulatory Visit: Payer: Self-pay

## 2020-02-19 ENCOUNTER — Ambulatory Visit (INDEPENDENT_AMBULATORY_CARE_PROVIDER_SITE_OTHER): Payer: Medicare Other | Admitting: Dermatology

## 2020-02-19 DIAGNOSIS — Z86018 Personal history of other benign neoplasm: Secondary | ICD-10-CM

## 2020-02-19 DIAGNOSIS — D1800 Hemangioma unspecified site: Secondary | ICD-10-CM

## 2020-02-19 DIAGNOSIS — D229 Melanocytic nevi, unspecified: Secondary | ICD-10-CM

## 2020-02-19 DIAGNOSIS — L821 Other seborrheic keratosis: Secondary | ICD-10-CM

## 2020-02-19 DIAGNOSIS — L820 Inflamed seborrheic keratosis: Secondary | ICD-10-CM | POA: Diagnosis not present

## 2020-02-19 DIAGNOSIS — I6523 Occlusion and stenosis of bilateral carotid arteries: Secondary | ICD-10-CM | POA: Diagnosis not present

## 2020-02-19 DIAGNOSIS — Z1283 Encounter for screening for malignant neoplasm of skin: Secondary | ICD-10-CM

## 2020-02-19 DIAGNOSIS — L814 Other melanin hyperpigmentation: Secondary | ICD-10-CM

## 2020-02-19 DIAGNOSIS — Z85828 Personal history of other malignant neoplasm of skin: Secondary | ICD-10-CM | POA: Diagnosis not present

## 2020-02-19 DIAGNOSIS — L578 Other skin changes due to chronic exposure to nonionizing radiation: Secondary | ICD-10-CM

## 2020-02-19 NOTE — Progress Notes (Deleted)
   Follow-Up Visit   Subjective  Kristina Banks is a 79 y.o. female who presents for the following: Annual Exam (Pt presents for 6 months f/u check sun exposed areas , Hx of skin cancers, Hx of Dysplastic nevus ) and Skin Problem (check spots on the body itching and irritating, ).   The following portions of the chart were reviewed this encounter and updated as appropriate:      Review of Systems:  No other skin or systemic complaints except as noted in HPI or Assessment and Plan.  Objective  Well appearing patient in no apparent distress; mood and affect are within normal limits.  A focused examination was performed including exts, trunk . Relevant physical exam findings are noted in the Assessment and Plan.  Objective  multiple see history: Scar with no evidence of recurrence.   Objective  R cheek: Well healed scar with no evidence of recurrence.   Objective  arms, legs (20): Erythematous keratotic or waxy stuck-on papule or plaque.   Objective  R zygoma: Well healed scar with no evidence of recurrence, no lymphadenopathy.    Assessment & Plan  History of dysplastic nevus multiple see history  Clear. Observe for recurrence. Call clinic for new or changing lesions.  Recommend regular skin exams, daily broad-spectrum spf 30+ sunscreen use, and photoprotection.     History of basal cell carcinoma (BCC) R cheek  Clear. Observe for recurrence. Call clinic for new or changing lesions.  Recommend regular skin exams, daily broad-spectrum spf 30+ sunscreen use, and photoprotection.  ;l  Inflamed seborrheic keratosis (20) arms, legs  Destruction of lesion - arms, legs Complexity: simple   Destruction method: cryotherapy   Informed consent: discussed and consent obtained   Timeout:  patient name, date of birth, surgical site, and procedure verified Lesion destroyed using liquid nitrogen: Yes   Region frozen until ice ball extended beyond lesion: Yes   Outcome: patient  tolerated procedure well with no complications   Post-procedure details: wound care instructions given    History of SCC (squamous cell carcinoma) of skin R zygoma  Clear. Observe for recurrence. Call clinic for new or changing lesions.  Recommend regular skin exams, daily broad-spectrum spf 30+ sunscreen use, and photoprotection.     Lentigines - Scattered tan macules - Discussed due to sun exposure - Benign, observe - Call for any changes  Seborrheic Keratoses - Stuck-on, waxy, tan-brown papules and plaques  - Discussed benign etiology and prognosis. - Observe - Call for any changes  Melanocytic Nevi - Tan-brown and/or pink-flesh-colored symmetric macules and papules - Benign appearing on exam today - Observation - Call clinic for new or changing moles - Recommend daily use of broad spectrum spf 30+ sunscreen to sun-exposed areas.   Hemangiomas - Red papules - Discussed benign nature - Observe - Call for any changes  Actinic Damage - diffuse scaly erythematous macules with underlying dyspigmentation - Recommend daily broad spectrum sunscreen SPF 30+ to sun-exposed areas, reapply every 2 hours as needed.  - Call for new or changing lesions.  Skin cancer screening performed today.  Purpura - Violaceous macules and patches - Benign - Related to age, sun damage and/or use of blood thinners - Observe - Can use OTC arnica containing moisturizer such as Dermend Bruise Formula if desired - Call for worsening or other concerns  Return in about 6 months (around 08/19/2020) for TBSE.  IMarye Round, CMA, am acting as scribe for Sarina Ser, MD .

## 2020-02-19 NOTE — Progress Notes (Signed)
Follow-Up Visit   Subjective  Kristina Banks is a 79 y.o. female who presents for the following: Annual Exam (Pt presents for 6 months f/u check sun exposed areas , Hx of skin cancers, Hx of Dysplastic nevus ) and Skin Problem (check spots on the body itching and irritating, ).  The following portions of the chart were reviewed this encounter and updated as appropriate:  Tobacco  Allergies  Meds  Problems  Med Hx  Surg Hx  Fam Hx     Review of Systems:  No other skin or systemic complaints except as noted in HPI or Assessment and Plan.  Objective  Well appearing patient in no apparent distress; mood and affect are within normal limits.  A full examination was performed including scalp, head, eyes, ears, nose, lips, neck, chest, axillae, abdomen, back, buttocks, bilateral upper extremities, bilateral lower extremities, hands, feet, fingers, toes, fingernails, and toenails. All findings within normal limits unless otherwise noted below.  Objective  multiple see history: Scar with no evidence of recurrence.   Objective  R cheek: Well healed scar with no evidence of recurrence.   Objective  arms, legs (20): Erythematous keratotic or waxy stuck-on papule or plaque.   Objective  R zygoma: Well healed scar with no evidence of recurrence, no lymphadenopathy.    Assessment & Plan  History of dysplastic nevus multiple see history  Clear. Observe for recurrence. Call clinic for new or changing lesions.  Recommend regular skin exams, daily broad-spectrum spf 30+ sunscreen use, and photoprotection.     History of basal cell carcinoma (BCC) R cheek  Clear. Observe for recurrence. Call clinic for new or changing lesions.  Recommend regular skin exams, daily broad-spectrum spf 30+ sunscreen use, and photoprotection.  ;l  Inflamed seborrheic keratosis (20) arms, legs  Destruction of lesion - arms, legs Complexity: simple   Destruction method: cryotherapy   Informed consent:  discussed and consent obtained   Timeout:  patient name, date of birth, surgical site, and procedure verified Lesion destroyed using liquid nitrogen: Yes   Region frozen until ice ball extended beyond lesion: Yes   Outcome: patient tolerated procedure well with no complications   Post-procedure details: wound care instructions given    History of SCC (squamous cell carcinoma) of skin R zygoma  Clear. Observe for recurrence. Call clinic for new or changing lesions.  Recommend regular skin exams, daily broad-spectrum spf 30+ sunscreen use, and photoprotection.      Lentigines - Scattered tan macules - Discussed due to sun exposure - Benign, observe - Call for any changes  Seborrheic Keratoses - Stuck-on, waxy, tan-brown papules and plaques  - Discussed benign etiology and prognosis. - Observe - Call for any changes  Melanocytic Nevi - Tan-brown and/or pink-flesh-colored symmetric macules and papules - Benign appearing on exam today - Observation - Call clinic for new or changing moles - Recommend daily use of broad spectrum spf 30+ sunscreen to sun-exposed areas.   Hemangiomas - Red papules - Discussed benign nature - Observe - Call for any changes  Actinic Damage - diffuse scaly erythematous macules with underlying dyspigmentation - Recommend daily broad spectrum sunscreen SPF 30+ to sun-exposed areas, reapply every 2 hours as needed.  - Call for new or changing lesions.  Skin cancer screening performed today.  Return in about 6 months (around 08/19/2020) for TBSE.  Marta Lamas, CMA, am acting as scribe for Sarina Ser, MD .  Documentation: I have reviewed the above documentation for accuracy  and completeness, and I agree with the above.  Sarina Ser, MD

## 2020-02-19 NOTE — Patient Instructions (Signed)
Cryotherapy Aftercare  . Wash gently with soap and water everyday.   . Apply Vaseline and Band-Aid daily until healed. Recommend daily broad spectrum sunscreen SPF 30+ to sun-exposed areas, reapply every 2 hours as needed. Call for new or changing lesions.  

## 2020-02-20 ENCOUNTER — Encounter: Payer: Self-pay | Admitting: Dermatology

## 2020-02-23 ENCOUNTER — Encounter (INDEPENDENT_AMBULATORY_CARE_PROVIDER_SITE_OTHER): Payer: Self-pay | Admitting: Vascular Surgery

## 2020-02-23 ENCOUNTER — Ambulatory Visit (INDEPENDENT_AMBULATORY_CARE_PROVIDER_SITE_OTHER): Payer: Medicare Other

## 2020-02-23 ENCOUNTER — Other Ambulatory Visit: Payer: Self-pay

## 2020-02-23 ENCOUNTER — Ambulatory Visit (INDEPENDENT_AMBULATORY_CARE_PROVIDER_SITE_OTHER): Payer: Medicare Other | Admitting: Vascular Surgery

## 2020-02-23 VITALS — BP 166/75 | HR 60 | Resp 16 | Wt 130.0 lb

## 2020-02-23 DIAGNOSIS — E782 Mixed hyperlipidemia: Secondary | ICD-10-CM

## 2020-02-23 DIAGNOSIS — I6523 Occlusion and stenosis of bilateral carotid arteries: Secondary | ICD-10-CM

## 2020-02-23 DIAGNOSIS — M159 Polyosteoarthritis, unspecified: Secondary | ICD-10-CM

## 2020-02-23 DIAGNOSIS — M8949 Other hypertrophic osteoarthropathy, multiple sites: Secondary | ICD-10-CM | POA: Diagnosis not present

## 2020-02-23 DIAGNOSIS — M1990 Unspecified osteoarthritis, unspecified site: Secondary | ICD-10-CM | POA: Insufficient documentation

## 2020-02-23 DIAGNOSIS — I10 Essential (primary) hypertension: Secondary | ICD-10-CM

## 2020-02-23 NOTE — Progress Notes (Signed)
MRN : [id number]  Kristina Banks is a 79 y.o. ([birth year]) female who presents with chief complaint of  Chief Complaint  Patient presents with  . Follow-up    ultrasound follow up  .  History of Present Illness:   The patient is seen for follow up evaluation of carotid stenosis.  The patient denies amaurosis fugax. There is no recent history of TIA symptoms or focal motor deficits. There is no prior documented CVA.  The patient is taking enteric-coated aspirin325mg  daily because she can't take Plavix.She is taking Crestor 10 mg daily because she did not tolerate Statins in the past.  There is no history of seizures.  [facility] a chief complaint of slurred speech, difficulty walking and left-sided weakness which started yesterday a.m.  Patient was not a candidate for TPA administration being outside the window. Patient's daughter notes that her mother who was in her usual state of health the evening before. Patient states that her symptoms have resolved today. Denies any slurred speech, left-sided weakness or inability to walk today.  The patient has a history of coronary artery disease, no recent episodes of angina or shortness of breath. The patient denies PAD or claudication symptoms. There is a history of hyperlipidemia which is being treated with a statin.  Duplex ultrasound of the carotid arteries obtained today demonstrates the right ICA 1 to 39% stenosis and the left ICA 1-39%.  Current Meds  Medication Sig  . ALPRAZolam (XANAX) 0.25 MG tablet TAKE ONE TABLET BY MOUTH AT BEDTIME AS NEEDED FOR SLEEP  . amLODipine (NORVASC) 5 MG tablet Take 5 mg by mouth daily.  Marland Kitchen aspirin 325 MG tablet Take 325 mg by mouth daily.  . Biotin 5000 MCG CAPS Take by mouth daily.  . Calcium Carbonate-Vitamin D (CALCIUM 600 + D PO) Take 1 tablet by mouth daily.   . Calcium Carbonate-Vitamin D 600-200 MG-UNIT  TABS Take by mouth.  . Cholecalciferol (VITAMIN D) 50 MCG (2000 UT) CAPS Take by mouth daily.  . hydrochlorothiazide (HYDRODIURIL) 12.5 MG tablet Take 12.5 mg by mouth daily.   . meloxicam (MOBIC) 15 MG tablet Take 1 tablet (15 mg total) by mouth daily.  . mupirocin ointment (BACTROBAN) 2 % mupirocin 2 % topical ointment  . olmesartan (BENICAR) 20 MG tablet Take 20 mg by mouth daily.  . rosuvastatin (CRESTOR) 10 MG tablet Take 1 tablet (10 mg total) by mouth daily at 6 PM.  . UNABLE TO FIND 20 mg. dimesartan medeomil    Past Medical History:  Diagnosis Date  . Actinic keratosis   . Arthritis   . Basal cell carcinoma (BCC) of right cheek 01/03/2016  . Change in hair   . Complication of anesthesia    "takes a little longer to wake up"   . DDD (degenerative disc disease), cervical   . Discolored nails    TOENAILS  . HBP (high blood pressure)   . History of dysplastic nevus 03/13/2013   Left inf. lat. buttock, left lat. hip. Mild atypia, edges free  . Hypertension   . Muscle pain   . PONV (postoperative nausea and vomiting)    nausea  . Shortness of breath dyspnea    with exertion  . 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 Stroke [facility]) 2006   tia  . TIA (transient ischemic attack)     Past Surgical History:  Procedure Laterality Date  . ABDOMINAL HYSTERECTOMY    . ARM SURGERY Left    BONE TUMOR  . ARTHOSCOPIC ROTAOR CUFF REPAIR Left 12/16/2014   Procedure: ARTHROSCOPIC ROTATOR CUFF REPAIR;  Surgeon: Earnestine Leys, MD;  Location: [facility] ORS;  Service: Orthopedics;  Laterality: Left;  . bone tumor removed     Lt arm  . CATARACT EXTRACTION W/PHACO Right 11/01/2015   Procedure: CATARACT EXTRACTION PHACO AND INTRAOCULAR LENS PLACEMENT (IOC);  Surgeon: Estill Cotta, MD;  Location: [facility] ORS;  Service: Ophthalmology;  Laterality: Right;  [place] 01:18 AP% 22.8 CDE 36.00 fluid pack lot # [id number]  . CATARACT  EXTRACTION W/PHACO Left 12/22/2015   Procedure: CATARACT EXTRACTION PHACO AND INTRAOCULAR LENS PLACEMENT (IOC);  Surgeon: Estill Cotta, MD;  Location: [facility] ORS;  Service: Ophthalmology;  Laterality: Left;  [place] 00:51 AP% 23.4 CDE 22.40 Fluid pack lot # [id number]  . COLONOSCOPY    . COLONOSCOPY WITH PROPOFOL N/A 04/18/2017   Procedure: COLONOSCOPY WITH PROPOFOL;  Surgeon: Manya Silvas, MD;  Location: [facility] ENDOSCOPY;  Service: Endoscopy;  Laterality: N/A;  . FOOT SURGERY Right    10.3.14  . FRACTURE SURGERY    . fractured knee Left   . LUMBAR LAMINECTOMY/DECOMPRESSION MICRODISCECTOMY Left 07/07/2015   Procedure: Left Lumbar two-three Extraforaminal Microdiskectomy;  Surgeon: Leeroy Cha, MD;  Location: [facility] ORS;  Service: Neurosurgery;  Laterality: Left;  Left L2-3 Extraforaminal Microdiskectomy  . TONSILLECTOMY      Social History Social History   Tobacco Use  . Smoking status: Never Smoker  . Smokeless tobacco: Never Used  Vaping Use  . Vaping Use: Never used  Substance Use Topics  . Alcohol use: No  . Drug use: No    Family History Family History  Problem Relation Age of Onset  . Varicose Veins Neg Hx     Allergies  Allergen Reactions  . Codeine Nausea And Vomiting and Other (See Comments)    Hallucinations  . Other   . Tramadol Other (See Comments)    Agitation   . Cephalexin Other (See Comments)    Dizziness, PATIENT DENIES  . Oxycodone Other (See Comments)    Dizzy     REVIEW OF SYSTEMS (Negative unless checked)  Constitutional: [] Weight loss  [] Fever  [] Chills Cardiac: [] Chest pain   [] Chest pressure   [] Palpitations   [] Shortness of breath when laying flat   [] Shortness of breath with exertion. Vascular:  [] Pain in legs with walking   [] Pain in legs at rest  [] History of DVT   [] Phlebitis   [] Swelling in legs   [] Varicose veins   [] Non-healing ulcers Pulmonary:   [] Uses home oxygen   [] Productive cough   [] Hemoptysis   [] Wheeze  [] COPD    [] Asthma Neurologic:  [] Dizziness   [] Seizures   [x] History of stroke   [x] History of TIA  [] Aphasia   [] Vissual changes   [] Weakness or numbness in arm   [] Weakness or numbness in leg Musculoskeletal:   [] Joint swelling   [] Joint pain   [] Low back pain Hematologic:  [] Easy bruising  [] Easy bleeding   [] Hypercoagulable state   [] Anemic Gastrointestinal:  [] Diarrhea   [] Vomiting  [] Gastroesophageal reflux/heartburn   [] Difficulty swallowing. Genitourinary:  [] Chronic kidney disease   [] Difficult urination  [] Frequent urination   [] Blood in urine Skin:  [] Rashes   [] Ulcers  Psychological:  [] History of anxiety   []  History of major depression.  Physical Examination  Vitals:   02/23/20 1115  BP: (!) 166/75  Pulse: 60  Resp: 16  Weight: 130 lb (59 kg)   Body mass index is 20.98 kg/m. Gen: WD/WN, NAD Head: [place]/AT, No temporalis wasting.  Ear/Nose/Throat: Hearing grossly intact, nares w/o erythema or drainage Eyes: PER, EOMI, sclera nonicteric.  Neck: Supple, no large masses.   Pulmonary:  Good air movement, no audible wheezing bilaterally, no use of accessory muscles.  Cardiac: RRR, no JVD Vascular:  No Carotid bruits Vessel Right Left  Radial Palpable Palpable  Carotid Palpable Palpable  Gastrointestinal: Non-distended. No guarding/no peritoneal signs.  Musculoskeletal: M/S 5/5 throughout.  No deformity or atrophy.  Neurologic: CN 2-12 intact. Symmetrical.  Speech is fluent. Motor exam as listed above. Psychiatric: Judgment intact, Mood & affect appropriate for pt's clinical situation. Dermatologic: No rashes or ulcers noted.  No changes consistent with cellulitis.   CBC Lab Results  Component Value Date   WBC 15.0 (H) 01/27/2019   HGB 14.5 01/27/2019   HCT 42.9 01/27/2019   MCV 91.5 01/27/2019   PLT 291 01/27/2019    BMET    Component Value Date/Time   NA 137 01/27/2019 1444   K 3.7 01/27/2019 1444   CL 105 01/27/2019 1444   CO2 22 01/27/2019 1444   GLUCOSE 151 (H)  01/27/2019 1444   BUN 29 (H) 01/27/2019 1444   CREATININE 0.82 01/27/2019 1444   CALCIUM 9.6 01/27/2019 1444   GFRNONAA >60 01/27/2019 1444   GFRAA >60 01/27/2019 1444   CrCl cannot be calculated (Patient's most recent lab result is older than the maximum 21 days allowed.).  COAG Lab Results  Component Value Date   INR 0.9 01/27/2019    Radiology No results found.    Assessment/Plan 1. Bilateral carotid artery stenosis Recommend:  Given the patient's asymptomatic subcritical stenosis no further invasive testing or surgery at this time.  Duplex ultrasound shows <40% stenosis bilaterally.  Continue antiplatelet therapy as prescribed Continue management of CAD, HTN and Hyperlipidemia Healthy heart diet,  encouraged exercise at least 4 times per week Follow up in 12 months with duplex ultrasound and physical exam  - VAS US CAROTID; Future  2. Benign essential hypertension Continue antihypertensive medications as already ordered, these medications have been reviewed and there are no changes at this time.   3. Mixed hyperlipidemia Continue statin as ordered and reviewed, no changes at this time   4. Primary osteoarthritis involving multiple joints Continue NSAID medications as already ordered, these medications have been reviewed and there are no changes at this time.  Continued activity and therapy was stressed.     Hortencia Pilar, MD  02/23/2020 11:17 AM

## 2020-06-07 ENCOUNTER — Ambulatory Visit (INDEPENDENT_AMBULATORY_CARE_PROVIDER_SITE_OTHER): Payer: Medicare Other | Admitting: Dermatology

## 2020-06-07 ENCOUNTER — Other Ambulatory Visit: Payer: Self-pay

## 2020-06-07 DIAGNOSIS — L820 Inflamed seborrheic keratosis: Secondary | ICD-10-CM

## 2020-06-07 DIAGNOSIS — Z872 Personal history of diseases of the skin and subcutaneous tissue: Secondary | ICD-10-CM

## 2020-06-07 DIAGNOSIS — Z85828 Personal history of other malignant neoplasm of skin: Secondary | ICD-10-CM | POA: Diagnosis not present

## 2020-06-07 DIAGNOSIS — D485 Neoplasm of uncertain behavior of skin: Secondary | ICD-10-CM | POA: Diagnosis not present

## 2020-06-07 DIAGNOSIS — S51812A Laceration without foreign body of left forearm, initial encounter: Secondary | ICD-10-CM | POA: Diagnosis not present

## 2020-06-07 NOTE — Progress Notes (Unsigned)
Follow-Up Visit   Subjective  Kristina Banks is a 80 y.o. female who presents for the following: Follow-up. Patient complains of several spots on her arms and chest that irritated she would like treated.  She has a persistent lesion of the right thigh that is sore.  She also tore the skin on her arm today.  She would like this evaluated. She has history of precancers and skin cancer treated in the past.  The following portions of the chart were reviewed this encounter and updated as appropriate:   Tobacco  Allergies  Meds  Problems  Med Hx  Surg Hx  Fam Hx     Review of Systems:  No other skin or systemic complaints except as noted in HPI or Assessment and Plan.  Objective  Well appearing patient in no apparent distress; mood and affect are within normal limits.  A focused examination was performed including right thigh, bilat axilla, left forearm. Relevant physical exam findings are noted in the Assessment and Plan.  Objective  Right mid ant thigh: 0.6 cm hyperkeratotic papule  Objective  Bilateral axilla (21): Erythematous keratotic or waxy stuck-on papule or plaque.   Objective  Left Forearm: Skin tear   Assessment & Plan  Neoplasm of uncertain behavior of skin Right mid ant thigh  Epidermal / dermal shaving  Lesion diameter (cm):  0.6 Informed consent: discussed and consent obtained   Timeout: patient name, date of birth, surgical site, and procedure verified   Procedure prep:  Patient was prepped and draped in usual sterile fashion Prep type:  Isopropyl alcohol Anesthesia: the lesion was anesthetized in a standard fashion   Anesthetic:  1% lidocaine w/ epinephrine 1-100,000 buffered w/ 8.4% NaHCO3 Instrument used: flexible razor blade   Hemostasis achieved with: pressure, aluminum chloride and electrodesiccation   Outcome: patient tolerated procedure well   Post-procedure details: sterile dressing applied and wound care instructions given   Dressing  type: bandage and petrolatum    Destruction of lesion Complexity: extensive   Destruction method: electrodesiccation and curettage   Informed consent: discussed and consent obtained   Timeout:  patient name, date of birth, surgical site, and procedure verified Procedure prep:  Patient was prepped and draped in usual sterile fashion Prep type:  Isopropyl alcohol Anesthesia: the lesion was anesthetized in a standard fashion   Anesthetic:  1% lidocaine w/ epinephrine 1-100,000 buffered w/ 8.4% NaHCO3 Curettage performed in three different directions: Yes   Electrodesiccation performed over the curetted area: Yes   Lesion length (cm):  0.6 Lesion width (cm):  0.6 Margin per side (cm):  0.2 Final wound size (cm):  1 Hemostasis achieved with:  pressure and aluminum chloride Outcome: patient tolerated procedure well with no complications   Post-procedure details: sterile dressing applied and wound care instructions given   Dressing type: bandage and petrolatum    Specimen 1 - Surgical pathology Differential Diagnosis: AK vs ISK vs SCC  Check Margins: No 0.6 cm hyperkeratotic papule  Inflamed seborrheic keratosis (21) Bilateral axilla  Destruction of lesion - Bilateral axilla Complexity: simple   Destruction method: cryotherapy   Informed consent: discussed and consent obtained   Timeout:  patient name, date of birth, surgical site, and procedure verified Lesion destroyed using liquid nitrogen: Yes   Region frozen until ice ball extended beyond lesion: Yes   Outcome: patient tolerated procedure well with no complications   Post-procedure details: wound care instructions given    Skin tear of left forearm from traumatic injury  without complication, initial encounter Left Forearm Area cleansed and redressed today. Recommend Mupirocin and bandage daily - patient has mupirocin  Actinic Damage - chronic, secondary to cumulative UV radiation exposure/sun exposure over time - diffuse  scaly erythematous macules with underlying dyspigmentation - Recommend daily broad spectrum sunscreen SPF 30+ to sun-exposed areas, reapply every 2 hours as needed.  - Call for new or changing lesions.  Return for Follow up as scheduled.  I, Ashok Cordia, CMA, am acting as scribe for Sarina Ser, MD .  Documentation: I have reviewed the above documentation for accuracy and completeness, and I agree with the above.  Sarina Ser, MD

## 2020-06-07 NOTE — Patient Instructions (Signed)

## 2020-06-08 ENCOUNTER — Encounter: Payer: Self-pay | Admitting: Dermatology

## 2020-06-10 ENCOUNTER — Telehealth: Payer: Self-pay

## 2020-06-10 NOTE — Telephone Encounter (Signed)
Patient informed of pathology results 

## 2020-06-10 NOTE — Telephone Encounter (Signed)
-----   Message from Ralene Bathe, MD sent at 06/09/2020  6:21 PM EST ----- Diagnosis Skin , right mid ant thigh VERRUCA VULGARIS, IRRITATED  Benign irritated viral wart May recur Already treated Recheck next visit

## 2020-07-07 ENCOUNTER — Telehealth (INDEPENDENT_AMBULATORY_CARE_PROVIDER_SITE_OTHER): Payer: Self-pay | Admitting: Vascular Surgery

## 2020-07-07 NOTE — Telephone Encounter (Signed)
Called stating that she has been having a pain in her shoulder going up to her neck to her left ear. Patient states the pain started yesterday. Patient was last seen 02/2020 with carotid studies. Moving carotid studies up with consult.   This note is for documentation purposes only.

## 2020-07-18 NOTE — Progress Notes (Signed)
MRN : [id number]  Kristina Banks is a 80 y.o. (June 09, 1940) female who presents with chief complaint of No chief complaint on file. Marland Kitchen  History of Present Illness:   The patient is seen for follow up evaluation of carotid stenosis. The carotid stenosis followed by ultrasound.   The patient denies amaurosis fugax. There is no recent history of TIA symptoms or focal motor deficits. There is no prior documented CVA.  The patient is taking enteric-coated aspirin 81 mg daily.  There is no history of migraine headaches. There is no history of seizures.  The patient has a history of coronary artery disease, no recent episodes of angina or shortness of breath. The patient denies PAD or claudication symptoms. There is a history of hyperlipidemia which is being treated with a statin.    Carotid Duplex done today shows RICA <78% and LICA 29-56%.   No outpatient medications have been marked as taking for the 07/19/20 encounter (Appointment) with Delana Meyer, Dolores Lory, MD.    Past Medical History:  Diagnosis Date  . Actinic keratosis   . Arthritis   . Basal cell carcinoma (BCC) of right cheek 01/03/2016  . Change in hair   . Complication of anesthesia    "takes a little longer to wake up"   . DDD (degenerative disc disease), cervical   . Discolored nails    TOENAILS  . HBP (high blood pressure)   . History of dysplastic nevus 03/13/2013   Left inf. lat. buttock, left lat. hip. Mild atypia, edges free  . Hypertension   . Muscle pain   . PONV (postoperative nausea and vomiting)    nausea  . Shortness of breath dyspnea    with exertion  . 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 Stroke [facility]) 2006   tia  . TIA (transient ischemic attack)     Past Surgical History:  Procedure Laterality Date  . ABDOMINAL HYSTERECTOMY    . ARM SURGERY Left    BONE TUMOR  . ARTHOSCOPIC ROTAOR CUFF REPAIR Left 12/16/2014    Procedure: ARTHROSCOPIC ROTATOR CUFF REPAIR;  Surgeon: Earnestine Leys, MD;  Location: [facility] ORS;  Service: Orthopedics;  Laterality: Left;  . bone tumor removed     Lt arm  . CATARACT EXTRACTION W/PHACO Right 11/01/2015   Procedure: CATARACT EXTRACTION PHACO AND INTRAOCULAR LENS PLACEMENT (IOC);  Surgeon: Estill Cotta, MD;  Location: [facility] ORS;  Service: Ophthalmology;  Laterality: Right;  [place] 01:18 AP% 22.8 CDE 36.00 fluid pack lot # [id number]  . CATARACT EXTRACTION W/PHACO Left 12/22/2015   Procedure: CATARACT EXTRACTION PHACO AND INTRAOCULAR LENS PLACEMENT (IOC);  Surgeon: Estill Cotta, MD;  Location: [facility] ORS;  Service: Ophthalmology;  Laterality: Left;  [place] 00:51 AP% 23.4 CDE 22.40 Fluid pack lot # [id number]  . COLONOSCOPY    . COLONOSCOPY WITH PROPOFOL N/A 04/18/2017   Procedure: COLONOSCOPY WITH PROPOFOL;  Surgeon: Manya Silvas, MD;  Location: [facility] ENDOSCOPY;  Service: Endoscopy;  Laterality: N/A;  . FOOT SURGERY Right    10.3.14  . FRACTURE SURGERY    . fractured knee Left   . LUMBAR LAMINECTOMY/DECOMPRESSION MICRODISCECTOMY Left 07/07/2015   Procedure: Left Lumbar two-three Extraforaminal Microdiskectomy;  Surgeon: Leeroy Cha, MD;  Location: [place] [facility]O ORS;  Service: Neurosurgery;  Laterality: Left;  Left L2-3 Extraforaminal Microdiskectomy  . TONSILLECTOMY      Social History Social History   Tobacco Use  . Smoking status: Never Smoker  .  Smokeless tobacco: Never Used  Vaping Use  . Vaping Use: Never used  Substance Use Topics  . Alcohol use: No  . Drug use: No    Family History Family History  Problem Relation Age of Onset  . Varicose Veins Neg Hx     Allergies  Allergen Reactions  . Codeine Nausea And Vomiting and Other (See Comments)    Hallucinations  . Other   . Tramadol Other (See Comments)    Agitation   . Cephalexin Other (See Comments)    Dizziness, PATIENT DENIES  . Oxycodone Other (See Comments)    Dizzy     REVIEW OF SYSTEMS  (Negative unless checked)  Constitutional: [] Weight loss  [] Fever  [] Chills Cardiac: [] Chest pain   [] Chest pressure   [] Palpitations   [] Shortness of breath when laying flat   [] Shortness of breath with exertion. Vascular:  [] Pain in legs with walking   [] Pain in legs at rest  [] History of DVT   [] Phlebitis   [] Swelling in legs   [] Varicose veins   [] Non-healing ulcers Pulmonary:   [] Uses home oxygen   [] Productive cough   [] Hemoptysis   [] Wheeze  [] COPD   [] Asthma Neurologic:  [] Dizziness   [] Seizures   [] History of stroke   [] History of TIA  [] Aphasia   [] Vissual changes   [] Weakness or numbness in arm   [] Weakness or numbness in leg Musculoskeletal:   [] Joint swelling   [] Joint pain   [] Low back pain Hematologic:  [] Easy bruising  [] Easy bleeding   [] Hypercoagulable state   [] Anemic Gastrointestinal:  [] Diarrhea   [] Vomiting  [] Gastroesophageal reflux/heartburn   [] Difficulty swallowing. Genitourinary:  [] Chronic kidney disease   [] Difficult urination  [] Frequent urination   [] Blood in urine Skin:  [] Rashes   [] Ulcers  Psychological:  [] History of anxiety   []  History of major depression.  Physical Examination  There were no vitals filed for this visit. There is no height or weight on file to calculate BMI. Gen: WD/WN, NAD Head: [place]/AT, No temporalis wasting.  Ear/Nose/Throat: Hearing grossly intact, nares w/o erythema or drainage Eyes: PER, EOMI, sclera nonicteric.  Neck: Supple, no large masses.   Pulmonary:  Good air movement, no audible wheezing bilaterally, no use of accessory muscles.  Cardiac: RRR, no JVD Vascular: left carotid bruit Vessel Right Left  Radial Palpable Palpable  Carotid Palpable Palpable  Gastrointestinal: Non-distended. No guarding/no peritoneal signs.  Musculoskeletal: M/S 5/5 throughout.  No deformity or atrophy.  Neurologic: CN 2-12 intact. Symmetrical.  Speech is fluent. Motor exam as listed above. Psychiatric: Judgment intact, Mood & affect appropriate  for pt's clinical situation. Dermatologic: No rashes or ulcers noted.  No changes consistent with cellulitis.    CBC Lab Results  Component Value Date   WBC 15.0 (H) 01/27/2019   HGB 14.5 01/27/2019   HCT 42.9 01/27/2019   MCV 91.5 01/27/2019   PLT 291 01/27/2019    BMET    Component Value Date/Time   NA 137 01/27/2019 1444   K 3.7 01/27/2019 1444   CL 105 01/27/2019 1444   CO2 22 01/27/2019 1444   GLUCOSE 151 (H) 01/27/2019 1444   BUN 29 (H) 01/27/2019 1444   CREATININE 0.82 01/27/2019 1444   CALCIUM 9.6 01/27/2019 1444   GFRNONAA >60 01/27/2019 1444   GFRAA >60 01/27/2019 1444   CrCl cannot be calculated (Patient's most recent lab result is older than the maximum 21 days allowed.).  COAG Lab Results  Component Value Date   INR 0.9  01/27/2019    Radiology No results found.   Assessment/Plan 1. Bilateral carotid artery stenosis Recommend:  Given the patient's asymptomatic subcritical stenosis no further invasive testing or surgery at this time.  Duplex ultrasound shows <40% stenosis on the right and now 40-59% on the left.  Slight increase on the left..  Continue antiplatelet therapy as prescribed Continue management of CAD, HTN and Hyperlipidemia Healthy heart diet,  encouraged exercise at least 4 times per week Follow up in 6 months with duplex ultrasound and physical exam   2. Primary hypertension Continue antihypertensive medications as already ordered, these medications have been reviewed and there are no changes at this time.   3. Primary osteoarthritis involving multiple joints Continue NSAID medications as already ordered, these medications have been reviewed and there are no changes at this time.  Continued activity and therapy was stressed.   4. Mixed hyperlipidemia Continue statin as ordered and reviewed, no changes at this time     Hortencia Pilar, MD  07/18/2020 9:02 PM

## 2020-07-19 ENCOUNTER — Ambulatory Visit (INDEPENDENT_AMBULATORY_CARE_PROVIDER_SITE_OTHER): Payer: Medicare Other | Admitting: Vascular Surgery

## 2020-07-19 ENCOUNTER — Ambulatory Visit (INDEPENDENT_AMBULATORY_CARE_PROVIDER_SITE_OTHER): Payer: Medicare Other

## 2020-07-19 ENCOUNTER — Other Ambulatory Visit: Payer: Self-pay

## 2020-07-19 ENCOUNTER — Encounter (INDEPENDENT_AMBULATORY_CARE_PROVIDER_SITE_OTHER): Payer: Self-pay | Admitting: Vascular Surgery

## 2020-07-19 VITALS — BP 168/69 | HR 65 | Ht 64.0 in | Wt 131.0 lb

## 2020-07-19 DIAGNOSIS — I10 Essential (primary) hypertension: Secondary | ICD-10-CM

## 2020-07-19 DIAGNOSIS — I6523 Occlusion and stenosis of bilateral carotid arteries: Secondary | ICD-10-CM

## 2020-07-19 DIAGNOSIS — M8949 Other hypertrophic osteoarthropathy, multiple sites: Secondary | ICD-10-CM

## 2020-07-19 DIAGNOSIS — E782 Mixed hyperlipidemia: Secondary | ICD-10-CM | POA: Diagnosis not present

## 2020-07-19 DIAGNOSIS — M159 Polyosteoarthritis, unspecified: Secondary | ICD-10-CM

## 2020-07-24 ENCOUNTER — Encounter (INDEPENDENT_AMBULATORY_CARE_PROVIDER_SITE_OTHER): Payer: Self-pay | Admitting: Vascular Surgery

## 2020-08-26 ENCOUNTER — Encounter: Payer: Medicare Other | Admitting: Dermatology

## 2020-10-20 DIAGNOSIS — H9202 Otalgia, left ear: Secondary | ICD-10-CM | POA: Insufficient documentation

## 2020-10-20 DIAGNOSIS — H9193 Unspecified hearing loss, bilateral: Secondary | ICD-10-CM | POA: Insufficient documentation

## 2020-10-21 ENCOUNTER — Ambulatory Visit (INDEPENDENT_AMBULATORY_CARE_PROVIDER_SITE_OTHER): Payer: Medicare Other | Admitting: Dermatology

## 2020-10-21 ENCOUNTER — Other Ambulatory Visit: Payer: Self-pay

## 2020-10-21 DIAGNOSIS — I6523 Occlusion and stenosis of bilateral carotid arteries: Secondary | ICD-10-CM

## 2020-10-21 DIAGNOSIS — S70362A Insect bite (nonvenomous), left thigh, initial encounter: Secondary | ICD-10-CM

## 2020-10-21 DIAGNOSIS — L820 Inflamed seborrheic keratosis: Secondary | ICD-10-CM

## 2020-10-21 DIAGNOSIS — L03116 Cellulitis of left lower limb: Secondary | ICD-10-CM | POA: Diagnosis not present

## 2020-10-21 DIAGNOSIS — W57XXXA Bitten or stung by nonvenomous insect and other nonvenomous arthropods, initial encounter: Secondary | ICD-10-CM

## 2020-10-21 DIAGNOSIS — L578 Other skin changes due to chronic exposure to nonionizing radiation: Secondary | ICD-10-CM | POA: Diagnosis not present

## 2020-10-21 MED ORDER — MUPIROCIN 2 % EX OINT: TOPICAL_OINTMENT | CUTANEOUS | 1 refills | Status: AC

## 2020-10-21 MED ORDER — DOXYCYCLINE HYCLATE 100 MG PO TABS: ORAL_TABLET | ORAL | 0 refills | Status: DC

## 2020-10-21 NOTE — Patient Instructions (Signed)

## 2020-10-21 NOTE — Progress Notes (Signed)
   Follow-Up Visit   Subjective  Kristina Banks is a 80 y.o. female who presents for the following: Lesion  (On the L arm - patient scratches at it, irregular, she would like checked today ) and tick bite (On the L post thigh x 1 week - very red, itched a lot at first but itching has since improved. She cleaned it with Clorox and squeezed at it ).  The following portions of the chart were reviewed this encounter and updated as appropriate:   Tobacco  Allergies  Meds  Problems  Med Hx  Surg Hx  Fam Hx      Review of Systems:  No other skin or systemic complaints except as noted in HPI or Assessment and Plan.  Objective  Well appearing patient in no apparent distress; mood and affect are within normal limits.  A focused examination was performed including the arms and legs. Relevant physical exam findings are noted in the Assessment and Plan.  L forearm x 1, R forearm x 1, L upper arm x 1 - total of 3 (2) Erythematous keratotic or waxy stuck-on papule or plaque.   Assessment & Plan  Inflamed seborrheic keratosis L forearm x 1, R forearm x 1, L upper arm x 1 - total of 3 Destruction of lesion - L forearm x 1, R forearm x 1, L upper arm x 1 - total of 3 Complexity: simple   Destruction method: cryotherapy   Informed consent: discussed and consent obtained   Timeout:  patient name, date of birth, surgical site, and procedure verified Lesion destroyed using liquid nitrogen: Yes   Region frozen until ice ball extended beyond lesion: Yes   Outcome: patient tolerated procedure well with no complications   Post-procedure details: wound care instructions given    Tick bite of left thigh, initial encounter Left Thigh - Posterior With cellulitis -  Start Doxycycline 100mg  po BID x 1 week. Watch for h/a, joint aches, and fever. If any symptoms of Lyme's disease must take for at least 3 weeks. Will send in a one month supply.   Start Mupirocin 2% ointment to aa QD until  healed.  doxycycline (VIBRA-TABS) 100 MG tablet - Left Thigh - Posterior Take with food.  Actinic Damage - chronic, secondary to cumulative UV radiation exposure/sun exposure over time - diffuse scaly erythematous macules with underlying dyspigmentation - Recommend daily broad spectrum sunscreen SPF 30+ to sun-exposed areas, reapply every 2 hours as needed.  - Recommend staying in the shade or wearing long sleeves, sun glasses (UVA+UVB protection) and wide brim hats (4-inch brim around the entire circumference of the hat). - Call for new or changing lesions.  Return for appointment as scheduled.  Luther Redo, CMA, am acting as scribe for Sarina Ser, MD .  Documentation: I have reviewed the above documentation for accuracy and completeness, and I agree with the above.  Sarina Ser, MD

## 2020-10-26 ENCOUNTER — Encounter: Payer: Self-pay | Admitting: Dermatology

## 2020-11-30 ENCOUNTER — Ambulatory Visit (INDEPENDENT_AMBULATORY_CARE_PROVIDER_SITE_OTHER): Payer: Medicare Other | Admitting: Dermatology

## 2020-11-30 ENCOUNTER — Other Ambulatory Visit: Payer: Self-pay

## 2020-11-30 DIAGNOSIS — D489 Neoplasm of uncertain behavior, unspecified: Secondary | ICD-10-CM

## 2020-11-30 DIAGNOSIS — C4491 Basal cell carcinoma of skin, unspecified: Secondary | ICD-10-CM

## 2020-11-30 DIAGNOSIS — Z85828 Personal history of other malignant neoplasm of skin: Secondary | ICD-10-CM

## 2020-11-30 DIAGNOSIS — C44719 Basal cell carcinoma of skin of left lower limb, including hip: Secondary | ICD-10-CM | POA: Diagnosis not present

## 2020-11-30 DIAGNOSIS — L659 Nonscarring hair loss, unspecified: Secondary | ICD-10-CM | POA: Diagnosis not present

## 2020-11-30 DIAGNOSIS — L578 Other skin changes due to chronic exposure to nonionizing radiation: Secondary | ICD-10-CM | POA: Diagnosis not present

## 2020-11-30 DIAGNOSIS — I6523 Occlusion and stenosis of bilateral carotid arteries: Secondary | ICD-10-CM | POA: Diagnosis not present

## 2020-11-30 HISTORY — DX: Basal cell carcinoma of skin, unspecified: C44.91

## 2020-11-30 NOTE — Progress Notes (Signed)
Follow-Up Visit   Subjective  Kristina Banks is a 80 y.o. female who presents for the following: Follow-up (Patient here today with concerns about place on left thigh that has been growing. Patient states it has been present for some time. Patient has history of skin cancer. ).  The following portions of the chart were reviewed this encounter and updated as appropriate:  Tobacco  Allergies  Meds  Problems  Med Hx  Surg Hx  Fam Hx     Objective  Well appearing patient in no apparent distress; mood and affect are within normal limits.  A focused examination was performed including left thigh. Relevant physical exam findings are noted in the Assessment and Plan.  Left Thigh - Anterior 1.1 cm hyperkeratotic papule      Scalp Diffuse thinning of the crown and widening of the midline part with retention of the frontal hairline - Reviewed progressive nature and prognosis.   Assessment & Plan  Neoplasm of uncertain behavior Left Thigh - Anterior  Epidermal / dermal shaving  Lesion diameter (cm):  1.1 Informed consent: discussed and consent obtained   Timeout: patient name, date of birth, surgical site, and procedure verified   Procedure prep:  Patient was prepped and draped in usual sterile fashion Prep type:  Isopropyl alcohol Anesthesia: the lesion was anesthetized in a standard fashion   Anesthetic:  1% lidocaine w/ epinephrine 1-100,000 buffered w/ 8.4% NaHCO3 Instrument used: flexible razor blade   Hemostasis achieved with: pressure, aluminum chloride and electrodesiccation   Outcome: patient tolerated procedure well   Post-procedure details: sterile dressing applied and wound care instructions given   Dressing type: bandage and petrolatum    Destruction of lesion Complexity: extensive   Destruction method: electrodesiccation and curettage   Informed consent: discussed and consent obtained   Timeout:  patient name, date of birth, surgical site, and procedure  verified Procedure prep:  Patient was prepped and draped in usual sterile fashion Prep type:  Isopropyl alcohol Anesthesia: the lesion was anesthetized in a standard fashion   Anesthetic:  1% lidocaine w/ epinephrine 1-100,000 buffered w/ 8.4% NaHCO3 Curettage performed in three different directions: Yes   Electrodesiccation performed over the curetted area: Yes   Lesion length (cm):  1.1 Lesion width (cm):  1.1 Margin per side (cm):  0.2 Final wound size (cm):  1.5 Hemostasis achieved with:  pressure, aluminum chloride and electrodesiccation Outcome: patient tolerated procedure well with no complications   Post-procedure details: sterile dressing applied and wound care instructions given   Dressing type: bandage and petrolatum    Specimen 1 - Surgical pathology Differential Diagnosis: r/o scc  Check Margins: No 1.1 cm hyperkeratotic papule   R/o scc   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 Scalp Telogen effluvium is a benign, self limited condition causing increased hair shedding usually for several months. It does not progress to baldness. It can be triggered by recent illness, recent surgery, thyroid disease, low iron stores, vitamin D deficiency, fad diets or rapid weight loss, hormonal changes such as pregnancy or birth control pills, and some medication. Usually the hair loss starts 2-3 months after the illness or health change. Rarely, it can continue for longer than a year.  We have reviewed treatment options multiple times over the last few years and patient has used some of these but is currently not on any treatment.  She seems to have stabilized. Patient has used biotin and hair max products   Actinic Damage - chronic,  secondary to cumulative UV radiation exposure/sun exposure over time - diffuse scaly erythematous macules with underlying dyspigmentation - Recommend daily broad spectrum sunscreen SPF 30+ to sun-exposed areas, reapply every 2  hours as needed.  - Recommend staying in the shade or wearing long sleeves, sun glasses (UVA+UVB protection) and wide brim hats (4-inch brim around the entire circumference of the hat). - Call for new or changing lesions.  Return for keep follow up as scheduled . IRuthell Rummage, CMA, am acting as scribe for Sarina Ser, MD. Documentation: I have reviewed the above documentation for accuracy and completeness, and I agree with the above.  Sarina Ser, MD

## 2020-11-30 NOTE — Patient Instructions (Addendum)
Biopsy Wound Care Instructions  Leave the original bandage on for 24 hours if possible.  If the bandage becomes soaked or soiled before that time, it is OK to remove it and examine the wound.  A small amount of post-operative bleeding is normal.  If excessive bleeding occurs, remove the bandage, place gauze over the site and apply continuous pressure (no peeking) over the area for 30 minutes. If this does not work, please call our clinic as soon as possible or page your doctor if it is after hours.   Once a day, cleanse the wound with soap and water. It is fine to shower. If a thick crust develops you may use a Q-tip dipped into dilute hydrogen peroxide (mix 1:1 with water) to dissolve it.  Hydrogen peroxide can slow the healing process, so use it only as needed.    After washing, apply petroleum jelly (Vaseline) or an antibiotic ointment if your doctor prescribed one for you, followed by a bandage.    For best healing, the wound should be covered with a layer of ointment at all times. If you are not able to keep the area covered with a bandage to hold the ointment in place, this may mean re-applying the ointment several times a day.  Continue this wound care until the wound has healed and is no longer open.   Itching and mild discomfort is normal during the healing process. However, if you develop pain or severe itching, please call our office.   If you have any discomfort, you can take Tylenol (acetaminophen) or ibuprofen as directed on the bottle. (Please do not take these if you have an allergy to them or cannot take them for another reason).  Some redness, tenderness and white or yellow material in the wound is normal healing.  If the area becomes very sore and red, or develops a thick yellow-green material (pus), it may be infected; please notify us.    If you have stitches, return to clinic as directed to have the stitches removed. You will continue wound care for 2-3 days after the stitches  are removed.   Wound healing continues for up to one year following surgery. It is not unusual to experience pain in the scar from time to time during the interval.  If the pain becomes severe or the scar thickens, you should notify the office.    A slight amount of redness in a scar is expected for the first six months.  After six months, the redness will fade and the scar will soften and fade.  The color difference becomes less noticeable with time.  If there are any problems, return for a post-op surgery check at your earliest convenience.  To improve the appearance of the scar, you can use silicone scar gel, cream, or sheets (such as Mederma or Serica) every night for up to one year. These are available over the counter (without a prescription).  Please call our office at [phone] for any questions or concerns.     Electrodesiccation and Curettage ("Scrape and Burn") Wound Care Instructions  Leave the original bandage on for 24 hours if possible.  If the bandage becomes soaked or soiled before that time, it is OK to remove it and examine the wound.  A small amount of post-operative bleeding is normal.  If excessive bleeding occurs, remove the bandage, place gauze over the site and apply continuous pressure (no peeking) over the area for 30 minutes. If this does not work, please  call our clinic as soon as possible or page your doctor if it is after hours.   Once a day, cleanse the wound with soap and water. It is fine to shower. If a thick crust develops you may use a Q-tip dipped into dilute hydrogen peroxide (mix 1:1 with water) to dissolve it.  Hydrogen peroxide can slow the healing process, so use it only as needed.    After washing, apply petroleum jelly (Vaseline) or an antibiotic ointment if your doctor prescribed one for you, followed by a bandage.    For best healing, the wound should be covered with a layer of ointment at all times. If you are not able to keep the area covered  with a bandage to hold the ointment in place, this may mean re-applying the ointment several times a day.  Continue this wound care until the wound has healed and is no longer open. It may take several weeks for the wound to heal and close.  Itching and mild discomfort is normal during the healing process.  If you have any discomfort, you can take Tylenol (acetaminophen) or ibuprofen as directed on the bottle. (Please do not take these if you have an allergy to them or cannot take them for another reason).  Some redness, tenderness and white or yellow material in the wound is normal healing.  If the area becomes very sore and red, or develops a thick yellow-green material (pus), it may be infected; please notify us.    Wound healing continues for up to one year following surgery. It is not unusual to experience pain in the scar from time to time during the interval.  If the pain becomes severe or the scar thickens, you should notify the office.    A slight amount of redness in a scar is expected for the first six months.  After six months, the redness will fade and the scar will soften and fade.  The color difference becomes less noticeable with time.  If there are any problems, return for a post-op surgery check at your earliest convenience.  To improve the appearance of the scar, you can use silicone scar gel, cream, or sheets (such as Mederma or Serica) every night for up to one year. These are available over the counter (without a prescription).  Please call our office at [phone] for any questions or concerns. If you have any questions or concerns for your doctor, please call our main line at [phone] and press option 4 to reach your doctor's medical assistant. If no one answers, please leave a voicemail as directed and we will return your call as soon as possible. Messages left after 4 pm will be answered the following business day.   You may also send [place] a message via [place]. We  typically respond to MyChart messages within 1-2 business days.  For prescription refills, please ask your pharmacy to contact our office. Our fax number is [phone].  If you have an urgent issue when the clinic is closed that cannot wait until the next business day, you can page your doctor at the number below.    Please note that while we do our best to be available for urgent issues outside of office hours, we are not available 24/7.   If you have an urgent issue and are unable to reach [place], you may choose to seek medical care at your doctor's office, retail clinic, urgent care center, or emergency room.  If you have a medical  emergency, please immediately call 911 or go to the emergency department.  Pager Numbers  - Dr. Nehemiah Massed: [phone]  - Dr. Laurence Ferrari: [phone]  - Dr. Nicole Kindred: [phone]  In the event of inclement weather, please call our main line at [phone] for an update on the status of any delays or closures.  Dermatology Medication Tips: Please keep the boxes that topical medications come in in order to help keep track of the instructions about where and how to use these. Pharmacies typically print the medication instructions only on the boxes and not directly on the medication tubes.   If your medication is too expensive, please contact our office at [phone] option 4 or send [place] a message through [place].   We are unable to tell what your co-pay for medications will be in advance as this is different depending on your insurance coverage. However, we may be able to find a substitute medication at lower cost or fill out paperwork to get insurance to cover a needed medication.   If a prior authorization is required to get your medication covered by your insurance company, please allow [place] 1-2 business days to complete this process.  Drug prices often vary depending on where the prescription is filled and some pharmacies may offer cheaper prices.  The  website www.goodrx.com contains coupons for medications through different pharmacies. The prices here do not account for what the cost may be with help from insurance (it may be cheaper with your insurance), but the website can give you the price if you did not use any insurance.  - You can print the associated coupon and take it with your prescription to the pharmacy.  - You may also stop by our office during regular business hours and pick up a GoodRx coupon card.  - If you need your prescription sent electronically to a different pharmacy, notify our office through [facility] or by phone at [phone] option 4.

## 2020-12-01 ENCOUNTER — Encounter: Payer: Self-pay | Admitting: Dermatology

## 2020-12-14 ENCOUNTER — Telehealth: Payer: Self-pay

## 2020-12-14 NOTE — Telephone Encounter (Signed)
Patient informed of pathology results 

## 2020-12-14 NOTE — Telephone Encounter (Signed)
-----   Message from Ralene Bathe, MD sent at 12/14/2020 12:04 PM EDT ----- Diagnosis Skin , left thigh - anterior BASAL CELL CARCINOMA, NODULAR PATTERN, ULCERATED  Cancer - BCC Already treated Recheck next visit

## 2021-01-28 ENCOUNTER — Other Ambulatory Visit (INDEPENDENT_AMBULATORY_CARE_PROVIDER_SITE_OTHER): Payer: Self-pay | Admitting: Vascular Surgery

## 2021-01-28 DIAGNOSIS — I6523 Occlusion and stenosis of bilateral carotid arteries: Secondary | ICD-10-CM

## 2021-01-30 NOTE — Progress Notes (Signed)
MRN : [id number]  Kristina Banks is a 80 y.o. ([birth year]) female who presents with chief complaint of check carotid.  History of Present Illness:   The patient is seen for follow up evaluation of carotid stenosis. The carotid stenosis followed by ultrasound.    The patient denies amaurosis fugax. There is no recent history of TIA symptoms or focal motor deficits. There is no prior documented CVA.   The patient is taking enteric-coated aspirin 81 mg daily.   There is no history of migraine headaches. There is no history of seizures.   The patient has a history of coronary artery disease, no recent episodes of angina or shortness of breath. The patient denies PAD or claudication symptoms. There is a history of hyperlipidemia which is being treated with a statin.     Carotid Duplex done today shows RICA and LICA 123456 stenosis bilaterally. Previous carotid duplex shows RICA [id number] and LICA 123456.   No outpatient medications have been marked as taking for the 01/31/21 encounter (Appointment) with Delana Meyer, Dolores Lory, MD.    Past Medical History:  Diagnosis Date   Actinic keratosis    Arthritis    Basal cell carcinoma (BCC) of right cheek 01/03/2016   BCC (basal cell carcinoma of skin) 11/30/2020   right upper quadrant 14 cm lateral to midline - ED&C   Change in hair    Complication of anesthesia    "takes a little longer to wake up"    DDD (degenerative disc disease), cervical    Discolored nails    TOENAILS   HBP (high blood pressure)    History of dysplastic nevus 03/13/2013   Left inf. lat. buttock, left lat. hip. 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 with superficial infiltration   Stroke ([place]) 2006   tia   TIA (transient ischemic attack)     Past Surgical History:   Procedure Laterality Date   ABDOMINAL HYSTERECTOMY     ARM SURGERY Left    BONE TUMOR   ARTHOSCOPIC ROTAOR CUFF REPAIR Left 12/16/2014   Procedure: ARTHROSCOPIC ROTATOR CUFF REPAIR;  Surgeon: Earnestine Leys, MD;  Location: [facility] ORS;  Service: Orthopedics;  Laterality: Left;   bone tumor removed     Lt arm   CATARACT EXTRACTION W/PHACO Right 11/01/2015   Procedure: CATARACT EXTRACTION PHACO AND INTRAOCULAR LENS PLACEMENT (IOC);  Surgeon: Estill Cotta, MD;  Location: [facility] ORS;  Service: Ophthalmology;  Laterality: Right;  [place] 01:18 AP% 22.8 CDE 36.00 fluid pack lot # [id number]   CATARACT EXTRACTION W/PHACO Left 12/22/2015   Procedure: CATARACT EXTRACTION PHACO AND INTRAOCULAR LENS PLACEMENT (IOC);  Surgeon: Estill Cotta, MD;  Location: [facility] ORS;  Service: Ophthalmology;  Laterality: Left;  [place] 00:51 AP% 23.4 CDE 22.40 Fluid pack lot # [id number]   COLONOSCOPY     COLONOSCOPY WITH PROPOFOL N/A 04/18/2017   Procedure: COLONOSCOPY WITH PROPOFOL;  Surgeon: Manya Silvas, MD;  Location: [facility] ENDOSCOPY;  Service: Endoscopy;  Laterality: N/A;   FOOT SURGERY Right    10.3.14   FRACTURE SURGERY     fractured knee Left    LUMBAR LAMINECTOMY/DECOMPRESSION MICRODISCECTOMY Left 07/07/2015   Procedure: Left Lumbar two-three Extraforaminal Microdiskectomy;  Surgeon: Leeroy Cha, MD;  Location: [facility] NEURO ORS;  Service:  Neurosurgery;  Laterality: Left;  Left L2-3 Extraforaminal Microdiskectomy   TONSILLECTOMY      Social History Social History   Tobacco Use   Smoking status: Never   Smokeless tobacco: Never  Vaping Use   Vaping Use: Never used  Substance Use Topics   Alcohol use: No   Drug use: No    Family History Family History  Problem Relation Age of Onset   Varicose Veins Neg Hx     Allergies  Allergen Reactions   Codeine Nausea And Vomiting and Other (See Comments)    Hallucinations   Other    Tramadol Other (See Comments)    Agitation    Cephalexin Other (See  Comments)    Dizziness, PATIENT DENIES   Oxycodone Other (See Comments)    Dizzy     REVIEW OF SYSTEMS (Negative unless checked)  Constitutional: '[]'$ Weight loss  '[]'$ Fever  '[]'$ Chills Cardiac: '[]'$ Chest pain   '[]'$ Chest pressure   '[]'$ Palpitations   '[]'$ Shortness of breath when laying flat   '[]'$ Shortness of breath with exertion. Vascular:  '[]'$ Pain in legs with walking   '[]'$ Pain in legs at rest  '[]'$ History of DVT   '[]'$ Phlebitis   '[]'$ Swelling in legs   '[]'$ Varicose veins   '[]'$ Non-healing ulcers Pulmonary:   '[]'$ Uses home oxygen   '[]'$ Productive cough   '[]'$ Hemoptysis   '[]'$ Wheeze  '[]'$ COPD   '[]'$ Asthma Neurologic:  '[]'$ Dizziness   '[]'$ Seizures   '[]'$ History of stroke   '[]'$ History of TIA  '[]'$ Aphasia   '[]'$ Vissual changes   '[]'$ Weakness or numbness in arm   '[]'$ Weakness or numbness in leg Musculoskeletal:   '[]'$ Joint swelling   '[]'$ Joint pain   '[]'$ Low back pain Hematologic:  '[]'$ Easy bruising  '[]'$ Easy bleeding   '[]'$ Hypercoagulable state   '[]'$ Anemic Gastrointestinal:  '[]'$ Diarrhea   '[]'$ Vomiting  '[]'$ Gastroesophageal reflux/heartburn   '[]'$ Difficulty swallowing. Genitourinary:  '[]'$ Chronic kidney disease   '[]'$ Difficult urination  '[]'$ Frequent urination   '[]'$ Blood in urine Skin:  '[]'$ Rashes   '[]'$ Ulcers  Psychological:  '[]'$ History of anxiety   '[]'$  History of major depression.  Physical Examination  There were no vitals filed for this visit. There is no height or weight on file to calculate BMI. Gen: WD/WN, NAD Head: [place]/AT, No temporalis wasting.  Ear/Nose/Throat: Hearing grossly intact, nares w/o erythema or drainage Eyes: PER, EOMI, sclera nonicteric.  Neck: Supple, no masses.  No bruit or JVD.  Pulmonary:  Good air movement, no audible wheezing, no use of accessory muscles.  Cardiac: RRR, normal S1, S2, no Murmurs. Vascular:   Bilateral carotid bruit noted about same as she was Vessel Right Left  Radial Palpable Palpable  Carotid Palpable Palpable  PT Palpable Palpable  DP Palpable Palpable  Gastrointestinal: soft, non-distended. No guarding/no peritoneal  signs.  Musculoskeletal: M/S 5/5 throughout.  No visible deformity.  Neurologic: CN 2-12 intact. Pain and light touch intact in extremities.  Symmetrical.  Speech is fluent. Motor exam as listed above. Psychiatric: Judgment intact, Mood & affect appropriate for pt's clinical situation. Dermatologic: No rashes or ulcers noted.  No changes consistent with cellulitis.   CBC Lab Results  Component Value Date   WBC 15.0 (H) 01/27/2019   HGB 14.5 01/27/2019   HCT 42.9 01/27/2019   MCV 91.5 01/27/2019   PLT 291 01/27/2019    BMET    Component Value Date/Time   NA 137 01/27/2019 1444   K 3.7 01/27/2019 1444   CL 105 01/27/2019 1444   CO2 22 01/27/2019 1444   GLUCOSE 151 (H) 01/27/2019 1444   BUN  29 (H) 01/27/2019 1444   CREATININE 0.82 01/27/2019 1444   CALCIUM 9.6 01/27/2019 1444   GFRNONAA >60 01/27/2019 1444   GFRAA >60 01/27/2019 1444   CrCl cannot be calculated (Patient's most recent lab result is older than the maximum 21 days allowed.).  COAG Lab Results  Component Value Date   INR 0.9 01/27/2019    Radiology No results found.   Assessment/Plan 1. Bilateral carotid artery stenosis Recommend:   Given the patient's asymptomatic subcritical stenosis no further invasive testing or surgery at this time.   Duplex ultrasound shows <40% stenosis on the right and now 40-59% on the left.  Slight increase on the left..   Continue antiplatelet therapy as prescribed Continue management of CAD, HTN and Hyperlipidemia Healthy heart diet,  encouraged exercise at least 4 times per week Follow up in 12 months with duplex ultrasound and physical exam   2. Primary hypertension Continue antihypertensive medications as already ordered, these medications have been reviewed and there are no changes at this time.   3. Radiculopathy, lumbar region Continue NSAID medications as already ordered, these medications have been reviewed and there are no changes at this time.  Continued  activity and therapy was stressed.   4. Mixed hyperlipidemia Continue statin as ordered and reviewed, no changes at this time     Hortencia Pilar, MD  01/30/2021 8:50 PM

## 2021-01-31 ENCOUNTER — Encounter (INDEPENDENT_AMBULATORY_CARE_PROVIDER_SITE_OTHER): Payer: Self-pay | Admitting: Vascular Surgery

## 2021-01-31 ENCOUNTER — Ambulatory Visit (INDEPENDENT_AMBULATORY_CARE_PROVIDER_SITE_OTHER): Payer: Medicare Other | Admitting: Vascular Surgery

## 2021-01-31 ENCOUNTER — Ambulatory Visit (INDEPENDENT_AMBULATORY_CARE_PROVIDER_SITE_OTHER): Payer: Medicare Other

## 2021-01-31 ENCOUNTER — Other Ambulatory Visit: Payer: Self-pay

## 2021-01-31 VITALS — BP 162/75 | HR 57 | Resp 18 | Ht 63.5 in | Wt 133.0 lb

## 2021-01-31 DIAGNOSIS — I6523 Occlusion and stenosis of bilateral carotid arteries: Secondary | ICD-10-CM | POA: Diagnosis not present

## 2021-01-31 DIAGNOSIS — M5416 Radiculopathy, lumbar region: Secondary | ICD-10-CM | POA: Diagnosis not present

## 2021-01-31 DIAGNOSIS — E782 Mixed hyperlipidemia: Secondary | ICD-10-CM | POA: Diagnosis not present

## 2021-01-31 DIAGNOSIS — I10 Essential (primary) hypertension: Secondary | ICD-10-CM | POA: Diagnosis not present

## 2021-02-02 ENCOUNTER — Encounter (INDEPENDENT_AMBULATORY_CARE_PROVIDER_SITE_OTHER): Payer: Self-pay | Admitting: Vascular Surgery

## 2021-02-21 ENCOUNTER — Encounter (INDEPENDENT_AMBULATORY_CARE_PROVIDER_SITE_OTHER): Payer: Medicare Other

## 2021-02-21 ENCOUNTER — Ambulatory Visit (INDEPENDENT_AMBULATORY_CARE_PROVIDER_SITE_OTHER): Payer: Medicare Other | Admitting: Vascular Surgery

## 2021-02-24 ENCOUNTER — Ambulatory Visit: Payer: Medicare Other | Admitting: Dermatology

## 2021-02-24 ENCOUNTER — Other Ambulatory Visit: Payer: Self-pay

## 2021-02-24 ENCOUNTER — Ambulatory Visit (INDEPENDENT_AMBULATORY_CARE_PROVIDER_SITE_OTHER): Payer: Medicare Other | Admitting: Dermatology

## 2021-02-24 ENCOUNTER — Encounter: Payer: Self-pay | Admitting: Dermatology

## 2021-02-24 DIAGNOSIS — L649 Androgenic alopecia, unspecified: Secondary | ICD-10-CM | POA: Diagnosis not present

## 2021-02-24 DIAGNOSIS — D229 Melanocytic nevi, unspecified: Secondary | ICD-10-CM

## 2021-02-24 DIAGNOSIS — L578 Other skin changes due to chronic exposure to nonionizing radiation: Secondary | ICD-10-CM

## 2021-02-24 DIAGNOSIS — L650 Telogen effluvium: Secondary | ICD-10-CM | POA: Diagnosis not present

## 2021-02-24 DIAGNOSIS — Z86018 Personal history of other benign neoplasm: Secondary | ICD-10-CM

## 2021-02-24 DIAGNOSIS — Z1283 Encounter for screening for malignant neoplasm of skin: Secondary | ICD-10-CM

## 2021-02-24 DIAGNOSIS — D692 Other nonthrombocytopenic purpura: Secondary | ICD-10-CM

## 2021-02-24 DIAGNOSIS — I6523 Occlusion and stenosis of bilateral carotid arteries: Secondary | ICD-10-CM | POA: Diagnosis not present

## 2021-02-24 DIAGNOSIS — L820 Inflamed seborrheic keratosis: Secondary | ICD-10-CM

## 2021-02-24 DIAGNOSIS — L821 Other seborrheic keratosis: Secondary | ICD-10-CM

## 2021-02-24 DIAGNOSIS — Z85828 Personal history of other malignant neoplasm of skin: Secondary | ICD-10-CM | POA: Diagnosis not present

## 2021-02-24 DIAGNOSIS — L570 Actinic keratosis: Secondary | ICD-10-CM | POA: Diagnosis not present

## 2021-02-24 DIAGNOSIS — L814 Other melanin hyperpigmentation: Secondary | ICD-10-CM

## 2021-02-24 DIAGNOSIS — D1800 Hemangioma unspecified site: Secondary | ICD-10-CM

## 2021-02-24 NOTE — Progress Notes (Signed)
Follow-Up Visit   Subjective  Kristina Banks is a 80 y.o. female who presents for the following: Annual Exam (Check spot on right posterior leg. Raised, rough. Patient picks at. HxBCC's, SCC's. HxDN. No other concerns. ). The patient presents for Total-Body Skin Exam (TBSE) for skin cancer screening and mole check.   The following portions of the chart were reviewed this encounter and updated as appropriate:  Tobacco  Allergies  Meds  Problems  Med Hx  Surg Hx  Fam Hx     Review of Systems: No other skin or systemic complaints except as noted in HPI or Assessment and Plan.  Objective  Well appearing patient in no apparent distress; mood and affect are within normal limits.  A full examination was performed including scalp, head, eyes, ears, nose, lips, neck, chest, axillae, abdomen, back, buttocks, bilateral upper extremities, bilateral lower extremities, hands, feet, fingers, toes, fingernails, and toenails. All findings within normal limits unless otherwise noted below.  Scalp Diffuse thinning of hair.  nose x1, right upper lip x1 (2) Erythematous thin papules/macules with gritty scale.   trunk and legs x17 (17) Erythematous keratotic or waxy stuck-on papule or plaque.   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   Discussed low-dose oral Minoxidil 1.25 mg daily to stimulate hair growth if neurologist approves.  Side effects are minimal to none at this low dose, but still could include ankle edema and increased growth of hair of the temples. Patient with hx of stroke. There does not seem to be any obvious contraindication to her considering this.  Her hair loss bothers her significantly. We will request her neurologist or primary care physician advise [place] whether they would prefer she not take this.  AK (actinic keratosis) nose x1, right upper lip x1 Actinic keratoses are precancerous spots that appear secondary to  cumulative UV radiation exposure/sun exposure over time. They are chronic with expected duration over 1 year. A portion of actinic keratoses will progress to squamous cell carcinoma of the skin. It is not possible to reliably predict which spots will progress to skin cancer and so treatment is recommended to prevent development of skin cancer.  Recommend daily broad spectrum sunscreen SPF 30+ to sun-exposed areas, reapply every 2 hours as needed.  Recommend staying in the shade or wearing long sleeves, sun glasses (UVA+UVB protection) and wide brim hats (4-inch brim around the entire circumference of the hat). Call for new or changing lesions.  Destruction of lesion - nose x1, right upper lip x1 Complexity: simple   Destruction method: cryotherapy   Informed consent: discussed and consent obtained   Timeout:  patient name, date of birth, surgical site, and procedure verified Lesion destroyed using liquid nitrogen: Yes   Region frozen until ice ball extended beyond lesion: Yes   Outcome: patient tolerated procedure well with no complications   Post-procedure details: wound care instructions given    Inflamed seborrheic keratosis trunk and legs x17 Destruction of lesion - trunk and legs x17 Complexity: simple   Destruction method: cryotherapy   Informed consent: discussed and consent obtained   Timeout:  patient name, date of birth, surgical site, and procedure verified Lesion destroyed using liquid nitrogen: Yes   Region frozen until ice ball extended beyond lesion: Yes   Outcome: patient tolerated procedure well with no complications   Post-procedure details: wound care instructions given    Skin cancer screening  Lentigines - Scattered tan macules - Due  to sun exposure - Benign-appearing, observe - Recommend daily broad spectrum sunscreen SPF 30+ to sun-exposed areas, reapply every 2 hours as needed. - Call for any changes  Seborrheic Keratoses - Stuck-on, waxy, tan-brown  papules and/or plaques  - Benign-appearing - Discussed benign etiology and prognosis. - Observe - Call for any changes  Melanocytic Nevi - Tan-brown and/or pink-flesh-colored symmetric macules and papules - Benign appearing on exam today - Observation - Call clinic for new or changing moles - Recommend daily use of broad spectrum spf 30+ sunscreen to sun-exposed areas.   Hemangiomas - Red papules - Discussed benign nature - Observe - Call for any changes  Actinic Damage - Chronic condition, secondary to cumulative UV/sun exposure - diffuse scaly erythematous macules with underlying dyspigmentation - Recommend daily broad spectrum sunscreen SPF 30+ to sun-exposed areas, reapply every 2 hours as needed.  - Staying in the shade or wearing long sleeves, sun glasses (UVA+UVB protection) and wide brim hats (4-inch brim around the entire circumference of the hat) are also recommended for sun protection.  - Call for new or changing lesions.  Skin cancer screening performed today.  History of Basal Cell Carcinoma of the Skin - No evidence of recurrence today at right cheek, RUQ abdomen. - Recommend regular full body skin exams - Recommend daily broad spectrum sunscreen SPF 30+ to sun-exposed areas, reapply every 2 hours as needed.  - Call if any new or changing lesions are noted between office visits   History of Squamous Cell Carcinoma of the Skin - No evidence of recurrence today at right zygoma. - No lymphadenopathy - Recommend regular full body skin exams - Recommend daily broad spectrum sunscreen SPF 30+ to sun-exposed areas, reapply every 2 hours as needed.  - Call if any new or changing lesions are noted between office visits  History of Dysplastic Nevi - No evidence of recurrence today at Left inf. lat. buttock, left lat. hip - Recommend regular full body skin exams - Recommend daily broad spectrum sunscreen SPF 30+ to sun-exposed areas, reapply every 2 hours as needed.  -  Call if any new or changing lesions are noted between office visits    Purpura - Chronic; persistent and recurrent.  Treatable, but not curable. - Violaceous macules and patches - Benign - Related to trauma, age, sun damage and/or use of blood thinners, chronic use of topical and/or oral steroids - Observe - Can use OTC arnica containing moisturizer such as Dermend Bruise Formula if desired - Call for worsening or other concerns   Return for face recheck in 6 months, TBSE in 1 year.  I, Emelia Salisbury, CMA, am acting as scribe for Sarina Ser, MD. Documentation: I have reviewed the above documentation for accuracy and completeness, and I agree with the above.  Sarina Ser, MD

## 2021-02-24 NOTE — Patient Instructions (Addendum)
Melanoma ABCDEs  Melanoma is the most dangerous type of skin cancer, and is the leading cause of death from skin disease.  You are more likely to develop melanoma if you: Have light-colored skin, light-colored eyes, or red or blond hair Spend a lot of time in the sun Tan regularly, either outdoors or in a tanning bed Have had blistering sunburns, especially during childhood Have a close family member who has had a melanoma Have atypical moles or large birthmarks  Early detection of melanoma is key since treatment is typically straightforward and cure rates are extremely high if we catch it early.   The first sign of melanoma is often a change in a mole or a new dark spot.  The ABCDE system is a way of remembering the signs of melanoma.  A for asymmetry:  The two halves do not match. B for border:  The edges of the growth are irregular. C for color:  A mixture of colors are present instead of an even brown color. D for diameter:  Melanomas are usually (but not always) greater than 6mm - the size of a pencil eraser. E for evolution:  The spot keeps changing in size, shape, and color.  Please check your skin once per month between visits. You can use a small mirror in front and a large mirror behind you to keep an eye on the back side or your body.   If you see any new or changing lesions before your next follow-up, please call to schedule a visit.  Please continue daily skin protection including broad spectrum sunscreen SPF 30+ to sun-exposed areas, reapplying every 2 hours as needed when you're outdoors.   Staying in the shade or wearing long sleeves, sun glasses (UVA+UVB protection) and wide brim hats (4-inch brim around the entire circumference of the hat) are also recommended for sun protection.    Prior to procedure, discussed risks of blister formation, small wound, skin dyspigmentation, or rare scar following cryotherapy. Recommend Vaseline ointment to treated areas while healing.    Cryotherapy Aftercare  Wash gently with soap and water everyday.   Apply Vaseline and Band-Aid daily until healed.   If you have any questions or concerns for your doctor, please call our main line at [phone] and press option 4 to reach your doctor's medical assistant. If no one answers, please leave a voicemail as directed and we will return your call as soon as possible. Messages left after 4 pm will be answered the following business day.   You may also send us a message via MyChart. We typically respond to MyChart messages within 1-2 business days.  For prescription refills, please ask your pharmacy to contact our office. Our fax number is [phone].  If you have an urgent issue when the clinic is closed that cannot wait until the next business day, you can page your doctor at the number below.    Please note that while we do our best to be available for urgent issues outside of office hours, we are not available 24/7.   If you have an urgent issue and are unable to reach us, you may choose to seek medical care at your doctor's office, retail clinic, urgent care center, or emergency room.  If you have a medical emergency, please immediately call 911 or go to the emergency department.  Pager Numbers  - Dr. Kowalski: [phone]  - Dr. Moye: [phone]  - Dr. Stewart: [phone]  In the event of inclement weather, please   call our main line at [phone] for an update on the status of any delays or closures.  Dermatology Medication Tips: Please keep the boxes that topical medications come in in order to help keep track of the instructions about where and how to use these. Pharmacies typically print the medication instructions only on the boxes and not directly on the medication tubes.   If your medication is too expensive, please contact our office at [phone] option 4 or send us a message through MyChart.   We are unable to tell what your co-pay for  medications will be in advance as this is different depending on your insurance coverage. However, we may be able to find a substitute medication at lower cost or fill out paperwork to get insurance to cover a needed medication.   If a prior authorization is required to get your medication covered by your insurance company, please allow us 1-2 business days to complete this process.  Drug prices often vary depending on where the prescription is filled and some pharmacies may offer cheaper prices.  The website www.goodrx.com contains coupons for medications through different pharmacies. The prices here do not account for what the cost may be with help from insurance (it may be cheaper with your insurance), but the website can give you the price if you did not use any insurance.  - You can print the associated coupon and take it with your prescription to the pharmacy.  - You may also stop by our office during regular business hours and pick up a GoodRx coupon card.  - If you need your prescription sent electronically to a different pharmacy, notify our office through [facility] MyChart or by phone at [phone] option 4.  

## 2021-08-25 ENCOUNTER — Ambulatory Visit (INDEPENDENT_AMBULATORY_CARE_PROVIDER_SITE_OTHER): Payer: Medicare Other | Admitting: Dermatology

## 2021-08-25 ENCOUNTER — Encounter: Payer: Self-pay | Admitting: Dermatology

## 2021-08-25 DIAGNOSIS — D692 Other nonthrombocytopenic purpura: Secondary | ICD-10-CM | POA: Diagnosis not present

## 2021-08-25 DIAGNOSIS — Z872 Personal history of diseases of the skin and subcutaneous tissue: Secondary | ICD-10-CM | POA: Diagnosis not present

## 2021-08-25 DIAGNOSIS — L578 Other skin changes due to chronic exposure to nonionizing radiation: Secondary | ICD-10-CM | POA: Diagnosis not present

## 2021-08-25 DIAGNOSIS — L570 Actinic keratosis: Secondary | ICD-10-CM

## 2021-08-25 DIAGNOSIS — L821 Other seborrheic keratosis: Secondary | ICD-10-CM

## 2021-08-25 NOTE — Patient Instructions (Addendum)
Cryotherapy Aftercare ? ?Wash gently with soap and water everyday.   ?Apply Vaseline and Band-Aid daily until healed.  ? ?Prior to procedure, discussed risks of blister formation, small wound, skin dyspigmentation, or rare scar following cryotherapy. Recommend Vaseline ointment to treated areas while healing.  ? ? ?Recommend daily broad spectrum sunscreen SPF 30+ to sun-exposed areas, reapply every 2 hours as needed. Call for new or changing lesions.  ?Staying in the shade or wearing long sleeves, sun glasses (UVA+UVB protection) and wide brim hats (4-inch brim around the entire circumference of the hat) are also recommended for sun protection.  ? ?If You Need Anything After Your Visit ? ?If you have any questions or concerns for your doctor, please call our main line at [phone] and press option 4 to reach your doctor's medical assistant. If no one answers, please leave a voicemail as directed and we will return your call as soon as possible. Messages left after 4 pm will be answered the following business day.  ? ?You may also send [place] a message via MyChart. We typically respond to MyChart messages within 1-2 business days. ? ?For prescription refills, please ask your pharmacy to contact our office. Our fax number is [phone]. ? ?If you have an urgent issue when the clinic is closed that cannot wait until the next business day, you can page your doctor at the number below.   ? ?Please note that while we do our best to be available for urgent issues outside of office hours, we are not available 24/7.  ? ?If you have an urgent issue and are unable to reach [place], you may choose to seek medical care at your doctor's office, retail clinic, urgent care center, or emergency room. ? ?If you have a medical emergency, please immediately call 911 or go to the emergency department. ? ?Pager Numbers ? ?- Dr. Nehemiah Massed: [phone] ? ?- Dr. Laurence Ferrari: [phone] ? ?- Dr. Nicole Kindred: [phone] ? ?In the event of inclement  weather, please call our main line at [phone] for an update on the status of any delays or closures. ? ?Dermatology Medication Tips: ?Please keep the boxes that topical medications come in in order to help keep track of the instructions about where and how to use these. Pharmacies typically print the medication instructions only on the boxes and not directly on the medication tubes.  ? ?If your medication is too expensive, please contact our office at [phone] option 4 or send [place] a message through [facility].  ? ?We are unable to tell what your co-pay for medications will be in advance as this is different depending on your insurance coverage. However, we may be able to find a substitute medication at lower cost or fill out paperwork to get insurance to cover a needed medication.  ? ?If a prior authorization is required to get your medication covered by your insurance company, please allow [place] 1-2 business days to complete this process. ? ?Drug prices often vary depending on where the prescription is filled and some pharmacies may offer cheaper prices. ? ?The website www.goodrx.com contains coupons for medications through different pharmacies. The prices here do not account for what the cost may be with help from insurance (it may be cheaper with your insurance), but the website can give you the price if you did not use any insurance.  ?- You can print the associated coupon and take it with your prescription to the pharmacy.  ?- You may also stop by our office during  regular business hours and pick up a GoodRx coupon card.  ?- If you need your prescription sent electronically to a different pharmacy, notify our office through [facility] or by phone at [phone] option 4. ? ? ? ? ?Si Usted Necesita Algo Despu?s de Su Visita ? ?Tambi?n puede enviarnos un mensaje a trav?s de MyChart. Por lo general respondemos a los mensajes de MyChart en el transcurso de 1 a 2 d?as h?biles. ? ?Para renovar recetas,  por favor pida a su farmacia que se ponga en contacto con nuestra oficina. Nuestro n?mero de fax es el [phone]. ? ?Si tiene un asunto urgente cuando la cl?nica est? cerrada y que no puede esperar hasta el siguiente d?a h?bil, puede llamar/localizar a su doctor(a) al n?mero que aparece a continuaci?n.  ? ?Por favor, tenga en cuenta que aunque hacemos todo lo posible para estar disponibles para asuntos urgentes fuera del horario de oficina, no estamos disponibles las 24 horas del d?a, los 7 d?as de la semana.  ? ?Si tiene un problema urgente y no puede comunicarse con nosotros, puede optar por buscar atenci?n m?dica  en el consultorio de su doctor(a), en una cl?nica privada, en un centro de atenci?n urgente o en una sala de emergencias. ? ?Si tiene Engineer, maintenance (IT) m?dica, por favor llame inmediatamente al 911 o vaya a la sala de emergencias. ? ?N?meros de b?per ? ?- Dr. Nehemiah Massed: [phone] ? ?- Dra. Moye: [phone] ? ?- Dra. Nicole Kindred: [phone] ? ?En caso de inclemencias del tiempo, por favor llame a nuestra l?nea principal al [phone] para una actualizaci?n sobre el estado de cualquier retraso o cierre. ? ?Consejos para la medicaci?n en dermatolog?a: ?Por favor, guarde las cajas en las que vienen los medicamentos de uso t?pico para ayudarle a seguir las instrucciones sobre d?nde y c?mo usarlos. Las farmacias generalmente imprimen las instrucciones del medicamento s?lo en las cajas y no directamente en los tubos del [place].  ? ?Si su medicamento es muy caro, por favor, p?ngase en contacto con Zigmund Daniel llamando al [phone] y presione la opci?n 4 o env?enos un mensaje a trav?s de MyChart.  ? ?No podemos decirle cu?l ser? su copago por los medicamentos por adelantado ya que esto es diferente dependiendo de la cobertura de su seguro. Sin embargo, es posible que podamos encontrar un medicamento sustituto a Electrical engineer un formulario para que el seguro cubra el medicamento que se  considera necesario.  ? ?Si se requiere Ardelia Mems autorizaci?n previa para que su compa??a de seguros [place] su medicamento, por favor perm?tanos de 1 a 2 d?as h?biles para completar este proceso. ? ?Los precios de los medicamentos var?an con frecuencia dependiendo del Environmental consultant de d?nde se surte la receta y alguna farmacias pueden ofrecer precios m?s baratos. ? ?El sitio web www.goodrx.com tiene cupones para medicamentos de Airline pilot. Los precios aqu? no tienen en cuenta lo que podr?a costar con la ayuda del seguro (puede ser m?s barato con su seguro), pero el sitio web puede darle el precio si no utiliz? ning?n seguro.  ?- Puede imprimir el cup?n correspondiente y llevarlo con su receta a la farmacia.  ?- Tambi?n puede pasar por nuestra oficina durante el horario de atenci?n regular y recoger una tarjeta de cupones de GoodRx.  ?- Si necesita que su receta se env?e electr?nicamente a Chiropodist, informe a nuestra oficina a trav?s de MyChart de [place] o por tel?fono llamando al [phone] y presione la opci?n 4.  ?

## 2021-08-25 NOTE — Progress Notes (Signed)
? ?Follow-Up Visit ?  ?Subjective  ?Kristina Banks is a 81 y.o. female who presents for the following: Actinic Keratosis (6 month recheck. Nose and right upper lip. Tx with LN2). ?The patient has spots, moles and lesions to be evaluated, some may be new or changing and the patient has concerns that these could be cancer. ? ?The following portions of the chart were reviewed this encounter and updated as appropriate:  Tobacco  Allergies  Meds  Problems  Med Hx  Surg Hx  Fam Hx   ?  ?Review of Systems: No other skin or systemic complaints except as noted in HPI or Assessment and Plan. ? ?Objective  ?Well appearing patient in no apparent distress; mood and affect are within normal limits. ? ?A focused examination was performed including head, including the scalp, face, neck, nose, ears, eyelids, and lips. Relevant physical exam findings are noted in the Assessment and Plan. ? ?Nose, right upper cutaneous lip ?Clear today ? ?Right Nasal Bridge x1 ?Erythematous thin papules/macules with gritty scale.  ? ? ?Assessment & Plan  ? ?Actinic Damage ?- chronic, secondary to cumulative UV radiation exposure/sun exposure over time ?- diffuse scaly erythematous macules with underlying dyspigmentation ?- Recommend daily broad spectrum sunscreen SPF 30+ to sun-exposed areas, reapply every 2 hours as needed.  ?- Recommend staying in the shade or wearing long sleeves, sun glasses (UVA+UVB protection) and wide brim hats (4-inch brim around the entire circumference of the hat). ?- Call for new or changing lesions.  ? ?Seborrheic Keratoses ?- Stuck-on, waxy, tan-brown papules and/or plaques  ?- Benign-appearing ?- Discussed benign etiology and prognosis. ?- Observe ?- Call for any changes ? ?Purpura - Chronic; persistent and recurrent.  Treatable, but not curable. ?- Violaceous macules and patches ?- Benign ?- Related to trauma, age, sun damage and/or use of blood thinners, chronic use of topical and/or oral steroids ?- Observe ?-  Can use OTC arnica containing moisturizer such as Dermend Bruise Formula if desired ?- Call for worsening or other concerns ? ?History of actinic keratoses ?Nose, right upper cutaneous lip ?Clear today ?Observe for recurrence.  ? ?AK (actinic keratosis) ?Right Nasal Bridge x1 ?Actinic keratoses are precancerous spots that appear secondary to cumulative UV radiation exposure/sun exposure over time. They are chronic with expected duration over 1 year. A portion of actinic keratoses will progress to squamous cell carcinoma of the skin. It is not possible to reliably predict which spots will progress to skin cancer and so treatment is recommended to prevent development of skin cancer. ? ?Recommend daily broad spectrum sunscreen SPF 30+ to sun-exposed areas, reapply every 2 hours as needed.  ?Recommend staying in the shade or wearing long sleeves, sun glasses (UVA+UVB protection) and wide brim hats (4-inch brim around the entire circumference of the hat). ?Call for new or changing lesions. ? ?Destruction of lesion - Right Nasal Bridge x1 ?Complexity: simple   ?Destruction method: cryotherapy   ?Informed consent: discussed and consent obtained   ?Timeout:  patient name, date of birth, surgical site, and procedure verified ?Lesion destroyed using liquid nitrogen: Yes   ?Region frozen until ice ball extended beyond lesion: Yes   ?Outcome: patient tolerated procedure well with no complications   ?Post-procedure details: wound care instructions given   ? ?Return for TBSE As Scheduled. ? ?I, Emelia Salisbury, CMA, am acting as scribe for Sarina Ser, MD. ?Documentation: I have reviewed the above documentation for accuracy and completeness, and I agree with the above. ? ?Sarina Ser,  MD ? ? ?

## 2021-08-30 ENCOUNTER — Encounter: Payer: Self-pay | Admitting: Dermatology

## 2022-01-11 ENCOUNTER — Ambulatory Visit (INDEPENDENT_AMBULATORY_CARE_PROVIDER_SITE_OTHER): Payer: Medicare Other | Admitting: Podiatry

## 2022-01-11 ENCOUNTER — Encounter: Payer: Self-pay | Admitting: Podiatry

## 2022-01-11 DIAGNOSIS — G5792 Unspecified mononeuropathy of left lower limb: Secondary | ICD-10-CM | POA: Diagnosis not present

## 2022-01-11 DIAGNOSIS — G5791 Unspecified mononeuropathy of right lower limb: Secondary | ICD-10-CM

## 2022-01-11 MED ORDER — DEXAMETHASONE SODIUM PHOSPHATE 120 MG/30ML IJ SOLN
4.0000 mg | Freq: Once | INTRAMUSCULAR | Status: AC
  Administered 2022-01-11: 4 mg via INTRA_ARTICULAR

## 2022-01-11 NOTE — Progress Notes (Signed)
She presents today after having not seen her for many years with a chief concern of painful ingrown toenails.  She states that has been going on for about a month now she states that they have not been red there is been no purulence no malodor and she has not even been to the spine to have her nails worked on.  She denies any changes in her past medical history medications allergies shoes or otherwise.  Objective: Vital signs stable alert oriented x3 pulses are palpable.  Neurologic sensorium is intact Deetjen reflexes are intact muscle strength is normal and symmetrical.  Neurologically she does have tenderness on palpation of the proper medial plantar nerve that runs out to the distal aspect of the toe but at the level of the metatarsal phalangeal joint.  She states that when this area is palpated causes the tenderness around her toenail.  She has no tenderness on palpation of the toenail otherwise.  This is bilateral.  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  Plan: I injected dexamethasone and local anesthetic to this nerve today which was easily palpable.  I will follow-up with her if necessary.  But there were no signs of infection of the toenail.

## 2022-01-23 ENCOUNTER — Ambulatory Visit (INDEPENDENT_AMBULATORY_CARE_PROVIDER_SITE_OTHER): Payer: Medicare Other | Admitting: Dermatology

## 2022-01-23 DIAGNOSIS — L820 Inflamed seborrheic keratosis: Secondary | ICD-10-CM | POA: Diagnosis not present

## 2022-01-23 DIAGNOSIS — L578 Other skin changes due to chronic exposure to nonionizing radiation: Secondary | ICD-10-CM

## 2022-01-23 DIAGNOSIS — L570 Actinic keratosis: Secondary | ICD-10-CM

## 2022-01-23 DIAGNOSIS — L821 Other seborrheic keratosis: Secondary | ICD-10-CM | POA: Diagnosis not present

## 2022-01-23 NOTE — Patient Instructions (Addendum)
Cryotherapy Aftercare  Wash gently with soap and water everyday.   Apply Vaseline and Band-Aid daily until healed.     Due to recent changes in healthcare laws, you may see results of your pathology and/or laboratory studies on MyChart before the doctors have had a chance to review them. We understand that in some cases there may be results that are confusing or concerning to you. Please understand that not all results are received at the same time and often the doctors may need to interpret multiple results in order to provide you with the best plan of care or course of treatment. Therefore, we ask that you please give us 2 business days to thoroughly review all your results before contacting the office for clarification. Should we see a critical lab result, you will be contacted sooner.   If You Need Anything After Your Visit  If you have any questions or concerns for your doctor, please call our main line at [phone] and press option 4 to reach your doctor's medical assistant. If no one answers, please leave a voicemail as directed and we will return your call as soon as possible. Messages left after 4 pm will be answered the following business day.   You may also send us a message via MyChart. We typically respond to MyChart messages within 1-2 business days.  For prescription refills, please ask your pharmacy to contact our office. Our fax number is [phone].  If you have an urgent issue when the clinic is closed that cannot wait until the next business day, you can page your doctor at the number below.    Please note that while we do our best to be available for urgent issues outside of office hours, we are not available 24/7.   If you have an urgent issue and are unable to reach us, you may choose to seek medical care at your doctor's office, retail clinic, urgent care center, or emergency room.  If you have a medical emergency, please immediately call 911 or go to the  emergency department.  Pager Numbers  - Dr. Kowalski: [phone]  - Dr. Moye: [phone]  - Dr. Stewart: [phone]  In the event of inclement weather, please call our main line at [phone] for an update on the status of any delays or closures.  Dermatology Medication Tips: Please keep the boxes that topical medications come in in order to help keep track of the instructions about where and how to use these. Pharmacies typically print the medication instructions only on the boxes and not directly on the medication tubes.   If your medication is too expensive, please contact our office at [phone] option 4 or send us a message through MyChart.   We are unable to tell what your co-pay for medications will be in advance as this is different depending on your insurance coverage. However, we may be able to find a substitute medication at lower cost or fill out paperwork to get insurance to cover a needed medication.   If a prior authorization is required to get your medication covered by your insurance company, please allow us 1-2 business days to complete this process.  Drug prices often vary depending on where the prescription is filled and some pharmacies may offer cheaper prices.  The website www.goodrx.com contains coupons for medications through different pharmacies. The prices here do not account for what the cost may be with help from insurance (it may be cheaper with your insurance), but the website can   give you the price if you did not use any insurance.  - You can print the associated coupon and take it with your prescription to the pharmacy.  - You may also stop by our office during regular business hours and pick up a GoodRx coupon card.  - If you need your prescription sent electronically to a different pharmacy, notify our office through [facility] MyChart or by phone at [phone] option 4.     Si Usted Necesita Algo Despus de Su Visita  Tambin puede  enviarnos un mensaje a travs de MyChart. Por lo general respondemos a los mensajes de MyChart en el transcurso de 1 a 2 das hbiles.  Para renovar recetas, por favor pida a su farmacia que se ponga en contacto con nuestra oficina. Nuestro nmero de fax es el [phone].  Si tiene un asunto urgente cuando la clnica est cerrada y que no puede esperar hasta el siguiente da hbil, puede llamar/localizar a su doctor(a) al nmero que aparece a continuacin.   Por favor, tenga en cuenta que aunque hacemos todo lo posible para estar disponibles para asuntos urgentes fuera del horario de oficina, no estamos disponibles las 24 horas del da, los 7 das de la semana.   Si tiene un problema urgente y no puede comunicarse con nosotros, puede optar por buscar atencin mdica  en el consultorio de su doctor(a), en una clnica privada, en un centro de atencin urgente o en una sala de emergencias.  Si tiene una emergencia mdica, por favor llame inmediatamente al 911 o vaya a la sala de emergencias.  Nmeros de bper  - Dr. Kowalski: [phone]  - Dra. Moye: [phone]  - Dra. Stewart: [phone]  En caso de inclemencias del tiempo, por favor llame a nuestra lnea principal al [phone] para una actualizacin sobre el estado de cualquier retraso o cierre.  Consejos para la medicacin en dermatologa: Por favor, guarde las cajas en las que vienen los medicamentos de uso tpico para ayudarle a seguir las instrucciones sobre dnde y cmo usarlos. Las farmacias generalmente imprimen las instrucciones del medicamento slo en las cajas y no directamente en los tubos del medicamento.   Si su medicamento es muy caro, por favor, pngase en contacto con nuestra oficina llamando al [phone] y presione la opcin 4 o envenos un mensaje a travs de MyChart.   No podemos decirle cul ser su copago por los medicamentos por adelantado ya que esto es diferente dependiendo de la cobertura de su seguro.  Sin embargo, es posible que podamos encontrar un medicamento sustituto a menor costo o llenar un formulario para que el seguro cubra el medicamento que se considera necesario.   Si se requiere una autorizacin previa para que su compaa de seguros cubra su medicamento, por favor permtanos de 1 a 2 das hbiles para completar este proceso.  Los precios de los medicamentos varan con frecuencia dependiendo del lugar de dnde se surte la receta y alguna farmacias pueden ofrecer precios ms baratos.  El sitio web www.goodrx.com tiene cupones para medicamentos de diferentes farmacias. Los precios aqu no tienen en cuenta lo que podra costar con la ayuda del seguro (puede ser ms barato con su seguro), pero el sitio web puede darle el precio si no utiliz ningn seguro.  - Puede imprimir el cupn correspondiente y llevarlo con su receta a la farmacia.  - Tambin puede pasar por nuestra oficina durante el horario de atencin regular y recoger una tarjeta de cupones de GoodRx.  -   Si necesita que su receta se enve electrnicamente a una farmacia diferente, informe a nuestra oficina a travs de MyChart de [facility] o por telfono llamando al [phone] y presione la opcin 4.  

## 2022-01-23 NOTE — Progress Notes (Addendum)
Follow-Up Visit   Subjective  Kristina Banks is a 81 y.o. female who presents for the following: Skin Problem (Check a mole on the left shoulder). The patient has spots, moles and lesions to be evaluated, some may be new or changing.   The following portions of the chart were reviewed this encounter and updated as appropriate:   Tobacco  Allergies  Meds  Problems  Med Hx  Surg Hx  Fam Hx     Review of Systems:  No other skin or systemic complaints except as noted in HPI or Assessment and Plan.  Objective  Well appearing patient in no apparent distress; mood and affect are within normal limits.  A focused examination was performed including face,shoulders. Relevant physical exam findings are noted in the Assessment and Plan.  left shoulder x 2 Stuck-on, waxy, tan-brown papules -- Discussed benign etiology and prognosis.   right lateral nose infraorbitial x 3 (3) Erythematous thin papules/macules with gritty scale.    Assessment & Plan  Inflamed seborrheic keratosis left shoulder x 2  Symptomatic, irritating, patient would like treated.   Destruction of lesion - left shoulder x 2 Complexity: simple   Destruction method: cryotherapy   Informed consent: discussed and consent obtained   Timeout:  patient name, date of birth, surgical site, and procedure verified Lesion destroyed using liquid nitrogen: Yes   Region frozen until ice ball extended beyond lesion: Yes   Outcome: patient tolerated procedure well with no complications   Post-procedure details: wound care instructions given    AK (actinic keratosis) (3) right lateral nose infraorbitial x 3  Actinic keratoses are precancerous spots that appear secondary to cumulative UV radiation exposure/sun exposure over time. They are chronic with expected duration over 1 year. A portion of actinic keratoses will progress to squamous cell carcinoma of the skin. It is not possible to reliably predict which spots will progress  to skin cancer and so treatment is recommended to prevent development of skin cancer.  Recommend daily broad spectrum sunscreen SPF 30+ to sun-exposed areas, reapply every 2 hours as needed.  Recommend staying in the shade or wearing long sleeves, sun glasses (UVA+UVB protection) and wide brim hats (4-inch brim around the entire circumference of the hat). Call for new or changing lesions.   Destruction of lesion - right lateral nose infraorbitial x 3 Complexity: simple   Destruction method: cryotherapy   Informed consent: discussed and consent obtained   Timeout:  patient name, date of birth, surgical site, and procedure verified Lesion destroyed using liquid nitrogen: Yes   Region frozen until ice ball extended beyond lesion: Yes   Outcome: patient tolerated procedure well with no complications   Post-procedure details: wound care instructions given    Actinic skin damage  Seborrheic keratosis  Actinic Damage - chronic, secondary to cumulative UV radiation exposure/sun exposure over time - diffuse scaly erythematous macules with underlying dyspigmentation - Recommend daily broad spectrum sunscreen SPF 30+ to sun-exposed areas, reapply every 2 hours as needed.  - Recommend staying in the shade or wearing long sleeves, sun glasses (UVA+UVB protection) and wide brim hats (4-inch brim around the entire circumference of the hat). - Call for new or changing lesions.  Seborrheic Keratoses - Stuck-on, waxy, tan-brown papules and/or plaques  - Benign-appearing - Discussed benign etiology and prognosis. - Observe - Call for any changes  Return if symptoms worsen or fail to improve.  IMarye Round, CMA, am acting as scribe for Sarina Ser, MD .  Documentation: I have reviewed the above documentation for accuracy and completeness, and I agree with the above.  Sarina Ser, MD

## 2022-01-25 ENCOUNTER — Encounter: Payer: Self-pay | Admitting: Dermatology

## 2022-02-03 ENCOUNTER — Other Ambulatory Visit (INDEPENDENT_AMBULATORY_CARE_PROVIDER_SITE_OTHER): Payer: Self-pay | Admitting: Vascular Surgery

## 2022-02-03 DIAGNOSIS — I6523 Occlusion and stenosis of bilateral carotid arteries: Secondary | ICD-10-CM

## 2022-02-06 ENCOUNTER — Ambulatory Visit (INDEPENDENT_AMBULATORY_CARE_PROVIDER_SITE_OTHER): Payer: Medicare Other | Admitting: Vascular Surgery

## 2022-02-06 ENCOUNTER — Encounter (INDEPENDENT_AMBULATORY_CARE_PROVIDER_SITE_OTHER): Payer: Self-pay | Admitting: Vascular Surgery

## 2022-02-06 ENCOUNTER — Ambulatory Visit (INDEPENDENT_AMBULATORY_CARE_PROVIDER_SITE_OTHER): Payer: Medicare Other

## 2022-02-06 VITALS — BP 168/74 | HR 61 | Resp 16 | Wt 133.4 lb

## 2022-02-06 DIAGNOSIS — I10 Essential (primary) hypertension: Secondary | ICD-10-CM

## 2022-02-06 DIAGNOSIS — E782 Mixed hyperlipidemia: Secondary | ICD-10-CM

## 2022-02-06 DIAGNOSIS — I6523 Occlusion and stenosis of bilateral carotid arteries: Secondary | ICD-10-CM

## 2022-02-06 DIAGNOSIS — I63449 Cerebral infarction due to embolism of unspecified cerebellar artery: Secondary | ICD-10-CM

## 2022-02-06 DIAGNOSIS — M5416 Radiculopathy, lumbar region: Secondary | ICD-10-CM

## 2022-02-06 NOTE — Progress Notes (Signed)
MRN : [id number]  Kristina Banks is a 81 y.o. (July 23, 1940) female who presents with chief complaint of check carotid arteries.  History of Present Illness:  The patient is seen for follow up evaluation of carotid stenosis. The carotid stenosis followed by ultrasound.    The patient denies recent amaurosis fugax. There is no recent history of TIA symptoms or focal motor deficits. There is a prior documented CVA documented on 01/27/2022.  CVA noted in the right internal capsule/basal ganglion.   The patient is taking enteric-coated aspirin 325 mg daily.  She is intolerant of Plavix.   There is no history of migraine headaches. There is no history of seizures.   The patient has a history of coronary artery disease, no recent episodes of angina or shortness of breath. The patient denies PAD or claudication symptoms. There is a history of hyperlipidemia which is being treated with a statin.     Carotid Duplex done today shows RICA and LICA 1-51% stenosis bilaterally. Previous carotid duplex shows RICA <76% and LICA 16-07%.   Current Meds  Medication Sig   ALPRAZolam (XANAX) 0.25 MG tablet TAKE ONE TABLET BY MOUTH AT BEDTIME AS NEEDED FOR SLEEP   amLODipine (NORVASC) 5 MG tablet Take 5 mg by mouth daily.   aspirin 325 MG tablet Take 325 mg by mouth daily.   Biotin 5000 MCG CAPS Take by mouth daily.   Calcium Carbonate-Vitamin D (CALCIUM 600 + D PO) Take 1 tablet by mouth daily.   Calcium Carbonate-Vitamin D 600-200 MG-UNIT TABS Take by mouth.   Cholecalciferol (VITAMIN D) 50 MCG (2000 UT) CAPS Take 25 Units by mouth daily.   clopidogrel (PLAVIX) 75 MG tablet Take 1 tablet (75 mg total) by mouth daily.   cyanocobalamin (,VITAMIN B-12,) 1000 MCG/ML injection    donepezil (ARICEPT) 5 MG tablet Take 1 tablet by mouth at bedtime.   fluticasone (FLONASE) 50 MCG/ACT nasal spray Place 2 sprays into both nostrils daily.   hydrochlorothiazide (HYDRODIURIL) 12.5 MG tablet Take 12.5 mg by mouth  daily.    meloxicam (MOBIC) 15 MG tablet Take 1 tablet (15 mg total) by mouth daily.   olmesartan (BENICAR) 20 MG tablet Take 20 mg by mouth daily.   UNABLE TO FIND 20 mg. dimesartan medeomil    Past Medical History:  Diagnosis Date   Actinic keratosis    Arthritis    Basal cell carcinoma (BCC) of right cheek 01/03/2016   BCC (basal cell carcinoma of skin) 11/30/2020   right upper quadrant 14 cm lateral to midline - ED&C   Change in hair    Complication of anesthesia    "takes a little longer to wake up"    DDD (degenerative disc disease), cervical    Discolored nails    TOENAILS   HBP (high blood pressure)    History of dysplastic nevus 03/13/2013   Left inf. lat. buttock, left lat. hip. 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 with superficial infiltration   Stroke ([place]) 2006   tia   TIA (transient ischemic attack)     Past Surgical History:  Procedure Laterality Date   ABDOMINAL HYSTERECTOMY     ARM SURGERY Left    BONE TUMOR   ARTHOSCOPIC  ROTAOR CUFF REPAIR Left 12/16/2014   Procedure: ARTHROSCOPIC ROTATOR CUFF REPAIR;  Surgeon: Earnestine Leys, MD;  Location: [facility] ORS;  Service: Orthopedics;  Laterality: Left;   bone tumor removed     Lt arm   CATARACT EXTRACTION W/PHACO Right 11/01/2015   Procedure: CATARACT EXTRACTION PHACO AND INTRAOCULAR LENS PLACEMENT (IOC);  Surgeon: Estill Cotta, MD;  Location: [facility] ORS;  Service: Ophthalmology;  Laterality: Right;  [place] 01:18 AP% 22.8 CDE 36.00 fluid pack lot # [id number]   CATARACT EXTRACTION W/PHACO Left 12/22/2015   Procedure: CATARACT EXTRACTION PHACO AND INTRAOCULAR LENS PLACEMENT (IOC);  Surgeon: Estill Cotta, MD;  Location: [facility] ORS;  Service: Ophthalmology;  Laterality: Left;  [place] 00:51 AP% 23.4 CDE 22.40 Fluid pack lot  # [id number]   COLONOSCOPY     COLONOSCOPY WITH PROPOFOL N/A 04/18/2017   Procedure: COLONOSCOPY WITH PROPOFOL;  Surgeon: Manya Silvas, MD;  Location[facility] END[facility]COPY;  Service: Endoscopy;  Laterality: N/A;   FOOT SURGERY Right    10.3.14   FRACTURE SURGERY     fractured knee Left    LUMBAR LAMINECTOMY/DECOMPRESSION MICRODISCECTOMY Left 07/07/2015   Procedure: Left Lumbar two-three Extraforaminal Microdiskectomy;  Surgeon: Leeroy Cha, MD;  Location: [facility];  Service: Neurosurgery;  Laterality: Left;  Left L2-3 Extraforaminal Microdiskectomy   TONSILLECTOMY      Social History Social History   Tobacco Use   Smoking status: Never   Smokeless tobacco: Never  Vaping Use   Vaping Use: Never used  Substance Use Topics   Alcohol use: No   Drug use: No    Family History Family History  Problem Relation Age of Onset   Varicose Veins Neg Hx     Allergies  Allergen Reactions   Codeine Nausea And Vomiting and Other (See Comments)    Hallucinations   Hydrocodone     Other reaction(s): Not available   Other    Tramadol Other (See Comments)    Agitation    Cephalexin Other (See Comments)    Dizziness, PATIENT DENIES   Oxycodone Other (See Comments)    Dizzy     REVIEW OF SYSTEMS (Negative unless checked)  Constitutional: '[]'$ Weight loss  '[]'$ Fever  '[]'$ Chills Cardiac: '[]'$ Chest pain   '[]'$ Chest pressure   '[]'$ Palpitations   '[]'$ Shortness of breath when laying flat   '[]'$ Shortness of breath with exertion. Vascular:  '[x]'$ Pain in legs with walking   '[]'$ Pain in legs at rest  '[]'$ History of DVT   '[]'$ Phlebitis   '[]'$ Swelling in legs   '[]'$ Varicose veins   '[]'$ Non-healing ulcers Pulmonary:   '[]'$ Uses home oxygen   '[]'$ Productive cough   '[]'$ Hemoptysis   '[]'$ Wheeze  '[]'$ COPD   '[]'$ Asthma Neurologic:  '[]'$ Dizziness   '[]'$ Seizures   '[]'$ History of stroke   '[]'$ History of TIA  '[]'$ Aphasia   '[]'$ Vissual changes   '[]'$ Weakness or numbness in arm   '[]'$ Weakness or numbness in leg Musculoskeletal:   '[]'$ Joint swelling   '[]'$ Joint pain    '[]'$ Low back pain Hematologic:  '[]'$ Easy bruising  '[]'$ Easy bleeding   '[]'$ Hypercoagulable state   '[]'$ Anemic Gastrointestinal:  '[]'$ Diarrhea   '[]'$ Vomiting  '[]'$ Gastroesophageal reflux/heartburn   '[]'$ Difficulty swallowing. Genitourinary:  '[]'$ Chronic kidney disease   '[]'$ Difficult urination  '[]'$ Frequent urination   '[]'$ Blood in urine Skin:  '[]'$ Rashes   '[]'$ Ulcers  Psychological:  '[]'$ History of anxiety   '[]'$  History of major depression.  Physical Examination  Vitals:   02/06/22 1118 02/06/22 1119  BP: (!) 172/65 (!) 168/74  Pulse: 61   Resp: 16   Weight: 133 lb 6.4  oz (60.5 kg)    Body mass index is 23.26 kg/m. Gen: WD/WN, NAD Head: [place]/AT, No temporalis wasting.  Ear/Nose/Throat: Hearing grossly intact, nares w/o erythema or drainage Eyes: PER, EOMI, sclera nonicteric.  Neck: Supple, no masses.  No bruit or JVD.  Pulmonary:  Good air movement, no audible wheezing, no use of accessory muscles.  Cardiac: RRR, normal S1, S2, no Murmurs. Vascular:  No carotid bruit noted Vessel Right Left  Radial Palpable Palpable  Carotid  Palpable  Palpable  Subclav  Palpable Palpable  Gastrointestinal: soft, non-distended. No guarding/no peritoneal signs.  Musculoskeletal: M/S 5/5 throughout.  No visible deformity.  Neurologic: CN 2-12 intact. Pain and light touch intact in extremities.  Symmetrical.  Speech is fluent. Motor exam as listed above. Psychiatric: Judgment intact, Mood & affect appropriate for pt's clinical situation. Dermatologic: No rashes or ulcers noted.  No changes consistent with cellulitis.   CBC Lab Results  Component Value Date   WBC 15.0 (H) 01/27/2019   HGB 14.5 01/27/2019   HCT 42.9 01/27/2019   MCV 91.5 01/27/2019   PLT 291 01/27/2019    BMET    Component Value Date/Time   NA 137 01/27/2019 1444   K 3.7 01/27/2019 1444   CL 105 01/27/2019 1444   CO2 22 01/27/2019 1444   GLUCOSE 151 (H) 01/27/2019 1444   BUN 29 (H) 01/27/2019 1444   CREATININE 0.82 01/27/2019 1444   CALCIUM 9.6  01/27/2019 1444   GFRNONAA >60 01/27/2019 1444   GFRAA >60 01/27/2019 1444   CrCl cannot be calculated (Patient's most recent lab result is older than the maximum 21 days allowed.).  COAG Lab Results  Component Value Date   INR 0.9 01/27/2019    Radiology No results found.   Assessment/Plan 1. Bilateral carotid artery stenosis Recommend:  Given the patient's asymptomatic subcritical stenosis no further invasive testing or surgery at this time.  Duplex ultrasound shows 1-39% stenosis bilaterally.  Continue antiplatelet therapy as prescribed Continue management of CAD, HTN and Hyperlipidemia Healthy heart diet,  encouraged exercise at least 4 times per week.  Follow up in 12 months with duplex ultrasound and physical exam.    - VAS US CAROTID; Future  2. Benign essential hypertension Continue antihypertensive medications as already ordered, these medications have been reviewed and there are no changes at this time.   3. 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  4. Radiculopathy, lumbar region Continue NSAID medications as already ordered, these medications have been reviewed and there are no changes at this time.  Continued activity and therapy was stressed.   5. Mixed hyperlipidemia Continue statin as ordered and reviewed, no changes at this time     Hortencia Pilar, MD  02/06/2022 11:23 AM

## 2022-03-02 ENCOUNTER — Ambulatory Visit (INDEPENDENT_AMBULATORY_CARE_PROVIDER_SITE_OTHER): Payer: Medicare Other | Admitting: Dermatology

## 2022-03-02 DIAGNOSIS — Z85828 Personal history of other malignant neoplasm of skin: Secondary | ICD-10-CM

## 2022-03-02 DIAGNOSIS — L578 Other skin changes due to chronic exposure to nonionizing radiation: Secondary | ICD-10-CM | POA: Diagnosis not present

## 2022-03-02 DIAGNOSIS — L821 Other seborrheic keratosis: Secondary | ICD-10-CM | POA: Diagnosis not present

## 2022-03-02 DIAGNOSIS — L814 Other melanin hyperpigmentation: Secondary | ICD-10-CM

## 2022-03-02 DIAGNOSIS — Z8589 Personal history of malignant neoplasm of other organs and systems: Secondary | ICD-10-CM

## 2022-03-02 DIAGNOSIS — L570 Actinic keratosis: Secondary | ICD-10-CM

## 2022-03-02 DIAGNOSIS — I6523 Occlusion and stenosis of bilateral carotid arteries: Secondary | ICD-10-CM | POA: Diagnosis not present

## 2022-03-02 DIAGNOSIS — D229 Melanocytic nevi, unspecified: Secondary | ICD-10-CM

## 2022-03-02 DIAGNOSIS — Z86018 Personal history of other benign neoplasm: Secondary | ICD-10-CM

## 2022-03-02 DIAGNOSIS — L820 Inflamed seborrheic keratosis: Secondary | ICD-10-CM | POA: Diagnosis not present

## 2022-03-02 DIAGNOSIS — Z1283 Encounter for screening for malignant neoplasm of skin: Secondary | ICD-10-CM | POA: Diagnosis not present

## 2022-03-02 NOTE — Progress Notes (Signed)
Follow-Up Visit   Subjective  Kristina Banks is a 81 y.o. female who presents for the following: Annual Exam (1 year tbse, hx of isk , hx of bcc, hx of dysplastic nevus, hx of scc). The patient presents for Total-Body Skin Exam (TBSE) for skin cancer screening and mole check.  The patient has spots, moles and lesions to be evaluated, some may be new or changing and the patient has concerns that these could be cancer.  The following portions of the chart were reviewed this encounter and updated as appropriate:  Tobacco  Allergies  Meds  Problems  Med Hx  Surg Hx  Fam Hx     Review of Systems: No other skin or systemic complaints except as noted in HPI or Assessment and Plan.  Objective  Well appearing patient in no apparent distress; mood and affect are within normal limits.  A full examination was performed including scalp, head, eyes, ears, nose, lips, neck, chest, axillae, abdomen, back, buttocks, bilateral upper extremities, bilateral lower extremities, hands, feet, fingers, toes, fingernails, and toenails. All findings within normal limits unless otherwise noted below.  back x 2 (2) Erythematous stuck-on, waxy papule or plaque  right chest parasternal x 1 Erythematous thin papules/macules with gritty scale.    Assessment & Plan  Inflamed seborrheic keratosis (2) back x 2 Symptomatic, irritating, patient would like treated. Destruction of lesion - back x 2 Complexity: simple   Destruction method: cryotherapy   Informed consent: discussed and consent obtained   Timeout:  patient name, date of birth, surgical site, and procedure verified Lesion destroyed using liquid nitrogen: Yes   Region frozen until ice ball extended beyond lesion: Yes   Outcome: patient tolerated procedure well with no complications   Post-procedure details: wound care instructions given   Additional details:  Prior to procedure, discussed risks of blister formation, small wound, skin  dyspigmentation, or rare scar following cryotherapy. Recommend Vaseline ointment to treated areas while healing.  Actinic keratosis right chest parasternal x 1 Recheck at next follow up Actinic keratoses are precancerous spots that appear secondary to cumulative UV radiation exposure/sun exposure over time. They are chronic with expected duration over 1 year. A portion of actinic keratoses will progress to squamous cell carcinoma of the skin. It is not possible to reliably predict which spots will progress to skin cancer and so treatment is recommended to prevent development of skin cancer.  Recommend daily broad spectrum sunscreen SPF 30+ to sun-exposed areas, reapply every 2 hours as needed.  Recommend staying in the shade or wearing long sleeves, sun glasses (UVA+UVB protection) and wide brim hats (4-inch brim around the entire circumference of the hat). Call for new or changing lesions.  Destruction of lesion - right chest parasternal x 1 Complexity: simple   Destruction method: cryotherapy   Informed consent: discussed and consent obtained   Timeout:  patient name, date of birth, surgical site, and procedure verified Lesion destroyed using liquid nitrogen: Yes   Region frozen until ice ball extended beyond lesion: Yes   Outcome: patient tolerated procedure well with no complications   Post-procedure details: wound care instructions given   Additional details:  Prior to procedure, discussed risks of blister formation, small wound, skin dyspigmentation, or rare scar following cryotherapy. Recommend Vaseline ointment to treated areas while healing.  Lentigines - Scattered tan macules - Due to sun exposure - Benign-appearing, observe - Recommend daily broad spectrum sunscreen SPF 30+ to sun-exposed areas, reapply every 2 hours as needed. -  Call for any changes  Seborrheic Keratoses - Stuck-on, waxy, tan-brown papules and/or plaques  - Benign-appearing - Discussed benign etiology and  prognosis. - Observe - Call for any changes  Melanocytic Nevi - Tan-brown and/or pink-flesh-colored symmetric macules and papules - Benign appearing on exam today - Observation - Call clinic for new or changing moles - Recommend daily use of broad spectrum spf 30+ sunscreen to sun-exposed areas.   Hemangiomas - Red papules - Discussed benign nature - Observe - Call for any changes  Actinic Damage - Chronic condition, secondary to cumulative UV/sun exposure - diffuse scaly erythematous macules with underlying dyspigmentation - Recommend daily broad spectrum sunscreen SPF 30+ to sun-exposed areas, reapply every 2 hours as needed.  - Staying in the shade or wearing long sleeves, sun glasses (UVA+UVB protection) and wide brim hats (4-inch brim around the entire circumference of the hat) are also recommended for sun protection.  - Call for new or changing lesions.  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  - Call if any new or changing lesions are  noted between office visits  Skin cancer screening performed today. Return in about 1 year (around 03/03/2023) for TBSE. IRuthell Rummage, CMA, am acting as scribe for Sarina Ser, MD. Documentation: I have reviewed the above documentation for accuracy and completeness, and I agree with the above.  Sarina Ser, MD

## 2022-03-02 NOTE — Patient Instructions (Addendum)
Seborrheic Keratosis  What causes seborrheic keratoses? Seborrheic keratoses are harmless, common skin growths that first appear during adult life.  As time goes by, more growths appear.  Some people may develop a large number of them.  Seborrheic keratoses appear on both covered and uncovered body parts.  They are not caused by sunlight.  The tendency to develop seborrheic keratoses can be inherited.  They vary in color from skin-colored to gray, brown, or even black.  They can be either smooth or have a rough, warty surface.   Seborrheic keratoses are superficial and look as if they were stuck on the skin.  Under the microscope this type of keratosis looks like layers upon layers of skin.  That is why at times the top layer may seem to fall off, but the rest of the growth remains and re-grows.    Treatment Seborrheic keratoses do not need to be treated, but can easily be removed in the office.  Seborrheic keratoses often cause symptoms when they rub on clothing or jewelry.  Lesions can be in the way of shaving.  If they become inflamed, they can cause itching, soreness, or burning.  Removal of a seborrheic keratosis can be accomplished by freezing, burning, or surgery. If any spot bleeds, scabs, or grows rapidly, please return to have it checked, as these can be an indication of a skin cancer.    Actinic keratoses are precancerous spots that appear secondary to cumulative UV radiation exposure/sun exposure over time. They are chronic with expected duration over 1 year. A portion of actinic keratoses will progress to squamous cell carcinoma of the skin. It is not possible to reliably predict which spots will progress to skin cancer and so treatment is recommended to prevent development of skin cancer.  Recommend daily broad spectrum sunscreen SPF 30+ to sun-exposed areas, reapply every 2 hours as needed.  Recommend staying in the shade or wearing long sleeves, sun glasses (UVA+UVB protection) and  wide brim hats (4-inch brim around the entire circumference of the hat). Call for new or changing lesions.   Cryotherapy Aftercare  Wash gently with soap and water everyday.   Apply Vaseline and Band-Aid daily until healed.   Melanoma ABCDEs  Melanoma is the most dangerous type of skin cancer, and is the leading cause of death from skin disease.  You are more likely to develop melanoma if you: Have light-colored skin, light-colored eyes, or red or blond hair Spend a lot of time in the sun Tan regularly, either outdoors or in a tanning bed Have had blistering sunburns, especially during childhood Have a close family member who has had a melanoma Have atypical moles or large birthmarks  Early detection of melanoma is key since treatment is typically straightforward and cure rates are extremely high if we catch it early.   The first sign of melanoma is often a change in a mole or a new dark spot.  The ABCDE system is a way of remembering the signs of melanoma.  A for asymmetry:  The two halves do not match. B for border:  The edges of the growth are irregular. C for color:  A mixture of colors are present instead of an even brown color. D for diameter:  Melanomas are usually (but not always) greater than 83m - the size of a pencil eraser. E for evolution:  The spot keeps changing in size, shape, and color.  Please check your skin once per month between visits. You can use a  small mirror in front and a large mirror behind you to keep an eye on the back side or your body.   If you see any new or changing lesions before your next follow-up, please call to schedule a visit.  Please continue daily skin protection including broad spectrum sunscreen SPF 30+ to sun-exposed areas, reapplying every 2 hours as needed when you're outdoors.   Staying in the shade or wearing long sleeves, sun glasses (UVA+UVB protection) and wide brim hats (4-inch brim around the entire circumference of the hat) are  also recommended for sun protection.    Due to recent changes in healthcare laws, you may see results of your pathology and/or laboratory studies on MyChart before the doctors have had a chance to review them. We understand that in some cases there may be results that are confusing or concerning to you. Please understand that not all results are received at the same time and often the doctors may need to interpret multiple results in order to provide you with the best plan of care or course of treatment. Therefore, we ask that you please give [place] 2 business days to thoroughly review all your results before contacting the office for clarification. Should we see a critical lab result, you will be contacted sooner.   If You Need Anything After Your Visit  If you have any questions or concerns for your doctor, please call our main line at [phone] and press option 4 to reach your doctor's medical assistant. If no one answers, please leave a voicemail as directed and we will return your call as soon as possible. Messages left after 4 pm will be answered the following business day.   You may also send [place] a message via [place]. We typically respond to MyChart messages within 1-2 business days.  For prescription refills, please ask your pharmacy to contact our office. Our fax number is [phone].  If you have an urgent issue when the clinic is closed that cannot wait until the next business day, you can page your doctor at the number below.    Please note that while we do our best to be available for urgent issues outside of office hours, we are not available 24/7.   If you have an urgent issue and are unable to reach [place], you may choose to seek medical care at your doctor's office, retail clinic, urgent care center, or emergency room.  If you have a medical emergency, please immediately call 911 or go to the emergency department.  Pager Numbers  - Dr. Nehemiah Massed: [phone]  - Dr. Laurence Ferrari:  [phone]  - Dr. Nicole Kindred: [phone]  In the event of inclement weather, please call our main line at [phone] for an update on the status of any delays or closures.  Dermatology Medication Tips: Please keep the boxes that topical medications come in in order to help keep track of the instructions about where and how to use these. Pharmacies typically print the medication instructions only on the boxes and not directly on the medication tubes.   If your medication is too expensive, please contact our office at [phone] option 4 or send [place] a message through [place].   We are unable to tell what your co-pay for medications will be in advance as this is different depending on your insurance coverage. However, we may be able to find a substitute medication at lower cost or fill out paperwork to get insurance to cover a needed medication.   If a prior  authorization is required to get your medication covered by your insurance company, please allow us 1-2 business days to complete this process.  Drug prices often vary depending on where the prescription is filled and some pharmacies may offer cheaper prices.  The website www.goodrx.com contains coupons for medications through different pharmacies. The prices here do not account for what the cost may be with help from insurance (it may be cheaper with your insurance), but the website can give you the price if you did not use any insurance.  - You can print the associated coupon and take it with your prescription to the pharmacy.  - You may also stop by our office during regular business hours and pick up a GoodRx coupon card.  - If you need your prescription sent electronically to a different pharmacy, notify our office through [facility] MyChart or by phone at [phone] option 4.     Si Usted Necesita Algo Despus de Su Visita  Tambin puede enviarnos un mensaje a travs de MyChart. Por lo general respondemos a los mensajes de  MyChart en el transcurso de 1 a 2 das hbiles.  Para renovar recetas, por favor pida a su farmacia que se ponga en contacto con nuestra oficina. Nuestro nmero de fax es el [phone].  Si tiene un asunto urgente cuando la clnica est cerrada y que no puede esperar hasta el siguiente da hbil, puede llamar/localizar a su doctor(a) al nmero que aparece a continuacin.   Por favor, tenga en cuenta que aunque hacemos todo lo posible para estar disponibles para asuntos urgentes fuera del horario de oficina, no estamos disponibles las 24 horas del da, los 7 das de la semana.   Si tiene un problema urgente y no puede comunicarse con nosotros, puede optar por buscar atencin mdica  en el consultorio de su doctor(a), en una clnica privada, en un centro de atencin urgente o en una sala de emergencias.  Si tiene una emergencia mdica, por favor llame inmediatamente al 911 o vaya a la sala de emergencias.  Nmeros de bper  - Dr. Kowalski: [phone]  - Dra. Moye: [phone]  - Dra. Stewart: [phone]  En caso de inclemencias del tiempo, por favor llame a nuestra lnea principal al [phone] para una actualizacin sobre el estado de cualquier retraso o cierre.  Consejos para la medicacin en dermatologa: Por favor, guarde las cajas en las que vienen los medicamentos de uso tpico para ayudarle a seguir las instrucciones sobre dnde y cmo usarlos. Las farmacias generalmente imprimen las instrucciones del medicamento slo en las cajas y no directamente en los tubos del medicamento.   Si su medicamento es muy caro, por favor, pngase en contacto con nuestra oficina llamando al [phone] y presione la opcin 4 o envenos un mensaje a travs de MyChart.   No podemos decirle cul ser su copago por los medicamentos por adelantado ya que esto es diferente dependiendo de la cobertura de su seguro. Sin embargo, es posible que podamos encontrar un medicamento sustituto a menor costo o  llenar un formulario para que el seguro cubra el medicamento que se considera necesario.   Si se requiere una autorizacin previa para que su compaa de seguros cubra su medicamento, por favor permtanos de 1 a 2 das hbiles para completar este proceso.  Los precios de los medicamentos varan con frecuencia dependiendo del lugar de dnde se surte la receta y alguna farmacias pueden ofrecer precios ms baratos.  El sitio web www.goodrx.com tiene cupones para   medicamentos de diferentes farmacias. Los precios aqu no tienen en cuenta lo que podra costar con la ayuda del seguro (puede ser ms barato con su seguro), pero el sitio web puede darle el precio si no utiliz ningn seguro.  - Puede imprimir el cupn correspondiente y llevarlo con su receta a la farmacia.  - Tambin puede pasar por nuestra oficina durante el horario de atencin regular y recoger una tarjeta de cupones de GoodRx.  - Si necesita que su receta se enve electrnicamente a una farmacia diferente, informe a nuestra oficina a travs de MyChart de [place] o por telfono llamando al [phone] y presione la opcin 4.  

## 2022-03-10 ENCOUNTER — Other Ambulatory Visit (HOSPITAL_COMMUNITY): Payer: Self-pay | Admitting: Infectious Diseases

## 2022-03-10 ENCOUNTER — Ambulatory Visit
Admission: RE | Admit: 2022-03-10 | Discharge: 2022-03-10 | Disposition: A | Discharge status: Home / Self Care | Payer: Medicare Other | Source: Ambulatory Visit | Attending: Infectious Diseases | Admitting: Infectious Diseases

## 2022-03-10 ENCOUNTER — Other Ambulatory Visit: Payer: Self-pay | Admitting: Infectious Diseases

## 2022-03-10 DIAGNOSIS — R531 Weakness: Secondary | ICD-10-CM

## 2022-03-10 DIAGNOSIS — Z8673 Personal history of transient ischemic attack (TIA), and cerebral infarction without residual deficits: Secondary | ICD-10-CM

## 2022-03-12 ENCOUNTER — Encounter: Payer: Self-pay | Admitting: Dermatology

## 2022-03-13 ENCOUNTER — Encounter (INDEPENDENT_AMBULATORY_CARE_PROVIDER_SITE_OTHER): Payer: Self-pay

## 2022-08-09 ENCOUNTER — Ambulatory Visit: Payer: Medicare Other | Admitting: Dermatology

## 2023-02-05 ENCOUNTER — Ambulatory Visit (INDEPENDENT_AMBULATORY_CARE_PROVIDER_SITE_OTHER): Payer: Medicare Other

## 2023-02-05 ENCOUNTER — Ambulatory Visit (INDEPENDENT_AMBULATORY_CARE_PROVIDER_SITE_OTHER): Payer: Medicare Other | Admitting: Vascular Surgery

## 2023-02-05 DIAGNOSIS — I6523 Occlusion and stenosis of bilateral carotid arteries: Secondary | ICD-10-CM | POA: Diagnosis not present

## 2023-03-08 ENCOUNTER — Ambulatory Visit (INDEPENDENT_AMBULATORY_CARE_PROVIDER_SITE_OTHER): Payer: Medicare Other | Admitting: Dermatology

## 2023-03-08 DIAGNOSIS — D1801 Hemangioma of skin and subcutaneous tissue: Secondary | ICD-10-CM

## 2023-03-08 DIAGNOSIS — L821 Other seborrheic keratosis: Secondary | ICD-10-CM | POA: Diagnosis not present

## 2023-03-08 DIAGNOSIS — Z86018 Personal history of other benign neoplasm: Secondary | ICD-10-CM

## 2023-03-08 DIAGNOSIS — W908XXA Exposure to other nonionizing radiation, initial encounter: Secondary | ICD-10-CM

## 2023-03-08 DIAGNOSIS — L578 Other skin changes due to chronic exposure to nonionizing radiation: Secondary | ICD-10-CM

## 2023-03-08 DIAGNOSIS — Z1283 Encounter for screening for malignant neoplasm of skin: Secondary | ICD-10-CM

## 2023-03-08 DIAGNOSIS — L814 Other melanin hyperpigmentation: Secondary | ICD-10-CM | POA: Diagnosis not present

## 2023-03-08 DIAGNOSIS — D692 Other nonthrombocytopenic purpura: Secondary | ICD-10-CM

## 2023-03-08 DIAGNOSIS — Z85828 Personal history of other malignant neoplasm of skin: Secondary | ICD-10-CM

## 2023-03-08 DIAGNOSIS — D229 Melanocytic nevi, unspecified: Secondary | ICD-10-CM

## 2023-03-08 DIAGNOSIS — Z8589 Personal history of malignant neoplasm of other organs and systems: Secondary | ICD-10-CM

## 2023-03-08 DIAGNOSIS — Z872 Personal history of diseases of the skin and subcutaneous tissue: Secondary | ICD-10-CM

## 2023-03-08 NOTE — Progress Notes (Signed)
Follow-Up Visit   Subjective  Kristina Banks is a 82 y.o. female who presents for the following: Skin Cancer Screening and Full Body Skin Exam  The patient presents for Total-Body Skin Exam (TBSE) for skin cancer screening and mole check. The patient has spots, moles and lesions to be evaluated, some may be new or changing and the patient may have concern these could be cancer.    The following portions of the chart were reviewed this encounter and updated as appropriate: medications, allergies, medical history  Review of Systems:  No other skin or systemic complaints except as noted in HPI or Assessment and Plan.  Objective  Well appearing patient in no apparent distress; mood and affect are within normal limits.  A full examination was performed including scalp, head, eyes, ears, nose, lips, neck, chest, axillae, abdomen, back, buttocks, bilateral upper extremities, bilateral lower extremities, hands, feet, fingers, toes, fingernails, and toenails. All findings within normal limits unless otherwise noted below.   Relevant physical exam findings are noted in the Assessment and Plan.    Assessment & Plan   SKIN CANCER SCREENING PERFORMED TODAY.  ACTINIC DAMAGE - Chronic condition, secondary to cumulative UV/sun exposure - diffuse scaly erythematous macules with underlying dyspigmentation - Recommend daily broad spectrum sunscreen SPF 30+ to sun-exposed areas, reapply every 2 hours as needed.  - Staying in the shade or wearing long sleeves, sun glasses (UVA+UVB protection) and wide brim hats (4-inch brim around the entire circumference of the hat) are also recommended for sun protection.  - Call for new or changing lesions.  LENTIGINES, SEBORRHEIC KERATOSES, HEMANGIOMAS - Benign normal skin lesions - Benign-appearing - Call for any changes  MELANOCYTIC NEVI - Tan-brown and/or pink-flesh-colored symmetric macules and papules - Benign appearing on exam today - Observation -  Call clinic for new or changing moles - Recommend daily use of broad spectrum spf 30+ sunscreen to sun-exposed areas.   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  - Call if any new or changing lesions are noted between office visits  Purpura - Chronic; persistent and recurrent.  Treatable, but not curable. - Violaceous macules and patches - Benign - Related to trauma, age, sun damage and/or use of blood thinners, chronic use of topical and/or oral steroids - Observe - Can use OTC arnica containing moisturizer such as Dermend Bruise Formula if desired - Call for worsening or other concerns  HISTORY OF PRECANCEROUS ACTINIC KERATOSIS - site(s) of PreCancerous Actinic Keratosis clear today. - these may recur and  new lesions may form requiring treatment to prevent transformation into skin cancer - observe for new or changing spots and contact [facility] for appointment if occur - photoprotection with sun protective clothing; sunglasses and  broad spectrum sunscreen with SPF of at least 30 + and frequent self skin exams recommended - yearly exams by a dermatologist recommended for persons with history of PreCancerous Actinic Keratoses   Return in about 1 year (around 03/07/2024) for TBSE, Hx SCC, Hx BCC, Hx AKs.  Wendee Beavers, CMA, am acting as scribe for Armida Sans, MD .   Documentation: I have reviewed the above documentation for accuracy and completeness, and I agree with the above.  Armida Sans, MD

## 2023-03-08 NOTE — Patient Instructions (Addendum)

## 2023-03-24 ENCOUNTER — Encounter: Payer: Self-pay | Admitting: Dermatology

## 2023-04-05 ENCOUNTER — Emergency Department: Payer: Medicare Other

## 2023-04-05 ENCOUNTER — Other Ambulatory Visit: Payer: Self-pay

## 2023-04-05 ENCOUNTER — Emergency Department
Admission: EM | Admit: 2023-04-05 | Discharge: 2023-04-05 | Disposition: A | Discharge status: Home / Self Care | Payer: Medicare Other | Attending: Emergency Medicine | Admitting: Emergency Medicine

## 2023-04-05 DIAGNOSIS — W010XXA Fall on same level from slipping, tripping and stumbling without subsequent striking against object, initial encounter: Secondary | ICD-10-CM | POA: Insufficient documentation

## 2023-04-05 DIAGNOSIS — Z23 Encounter for immunization: Secondary | ICD-10-CM | POA: Diagnosis not present

## 2023-04-05 DIAGNOSIS — R0781 Pleurodynia: Secondary | ICD-10-CM | POA: Diagnosis not present

## 2023-04-05 DIAGNOSIS — S8991XA Unspecified injury of right lower leg, initial encounter: Secondary | ICD-10-CM | POA: Diagnosis present

## 2023-04-05 DIAGNOSIS — S60812A Abrasion of left wrist, initial encounter: Secondary | ICD-10-CM | POA: Diagnosis not present

## 2023-04-05 DIAGNOSIS — S80211A Abrasion, right knee, initial encounter: Secondary | ICD-10-CM | POA: Diagnosis not present

## 2023-04-05 DIAGNOSIS — S50311A Abrasion of right elbow, initial encounter: Secondary | ICD-10-CM | POA: Diagnosis not present

## 2023-04-05 DIAGNOSIS — Y92511 Restaurant or cafe as the place of occurrence of the external cause: Secondary | ICD-10-CM | POA: Diagnosis not present

## 2023-04-05 DIAGNOSIS — W19XXXA Unspecified fall, initial encounter: Secondary | ICD-10-CM

## 2023-04-05 MED ORDER — TETANUS-DIPHTH-ACELL PERTUSSIS 5-2.5-18.5 LF-MCG/0.5 IM SUSY
0.5000 mL | PREFILLED_SYRINGE | Freq: Once | INTRAMUSCULAR | Status: AC
  Administered 2023-04-05: 0.5 mL via INTRAMUSCULAR
  Filled 2023-04-05: qty 0.5

## 2023-04-05 NOTE — ED Provider Notes (Signed)
[facility] Provider Note    Event Date/Time   First MD Initiated Contact with Patient 04/05/23 1458     (approximate)   History   Fall   HPI Kristina Banks is a 82 y.o. female presenting today for fall.  Patient reportedly tripped coming out of a restaurant hitting her knees, left hand, and left chest wall.  She notes pain and abrasions over her right knee, left wrist.  Pain also over the left side of her rib cage.  Denies hitting her head or neck.  No loss of consciousness.  No pain to her back, abdomen, or elsewhere on her extremities.  No blood thinners.     Physical Exam   Triage Vital Signs: ED Triage Vitals  Encounter Vitals Group     BP 04/05/23 1349 (!) 148/80     Systolic BP Percentile --      Diastolic BP Percentile --      Pulse Rate 04/05/23 1346 67     Resp 04/05/23 1346 18     Temp 04/05/23 1346 97.7 F (36.5 C)     Temp src --      SpO2 04/05/23 1346 97 %     Weight 04/05/23 1350 123 lb (55.8 kg)     Height 04/05/23 1350 5\' 6"  (1.676 m)     Head Circumference --      Peak Flow --      Pain Score 04/05/23 1349 5     Pain Loc --      Pain Education --      Exclude from Growth Chart --     Most recent vital signs: Vitals:   04/05/23 1346 04/05/23 1349  BP:  (!) 148/80  Pulse: 67   Resp: 18   Temp: 97.7 F (36.5 C)   SpO2: 97%    I have reviewed the vital signs. General:  Awake, alert, no acute distress. Head:  Normocephalic, Atraumatic. EENT:  PERRL, EOMI, Oral mucosa pink and moist, Neck is supple. Cardiovascular: Regular rate, 2+ distal pulses. Respiratory:  Normal respiratory effort, symmetrical expansion, no distress.   Extremities:  Moving all four extremities through full ROM without pain.  Tenderness palpation over her right knee, left lower chest wall, and left pinky.  No tenderness palpation throughout the rest of her upper or lower extremities.  No C, T, or L-spine tenderness palpation.  No abdominal  tenderness. Neuro:  Alert and oriented.  Interacting appropriately.   Skin: Abrasion over right elbow, right knee, and left volar wrist. Psych: Appropriate affect.    ED Results / Procedures / Treatments   Labs (all labs ordered are listed, but only abnormal results are displayed) Labs Reviewed - No data to display   EKG    RADIOLOGY Independently interpreted x-rays with no acute pathology   PROCEDURES:  Critical Care performed: No  Procedures   MEDICATIONS ORDERED IN ED: Medications  Tdap (BOOSTRIX) injection 0.5 mL (has no administration in time range)     IMPRESSION / MDM / ASSESSMENT AND PLAN / ED COURSE  I reviewed the triage vital signs and the nursing notes.                              Differential diagnosis includes, but is not limited to, rib fracture, tibia versus femur fracture, left pinky fracture, soft tissue bruising  Patient's presentation is most consistent with acute complicated illness / injury  requiring diagnostic workup.  Patient is an 82 year old female presenting today for ground-level fall without head injury or LOC.  Pain symptoms to her left ribs, left pinky, and right knee.  X-ray showed no acute traumatic pathology to the sites.  Her abrasions were cleaned and redressed here in the ED.  Tetanus shot was updated.  Patient safe for discharge and recommended Tylenol and ibuprofen to help with pain symptoms.  Given strict return precautions and told to follow-up with PCP.     FINAL CLINICAL IMPRESSION(S) / ED DIAGNOSES   Final diagnoses:  Fall, initial encounter  Abrasion of right knee, initial encounter  Rib pain on left side     Rx / DC Orders   ED Discharge Orders     None        Note:  This document was prepared using Dragon voice recognition software and may include unintentional dictation errors.   Janith Lima, MD 04/05/23 1556

## 2023-04-05 NOTE — ED Notes (Signed)
See triage notes. Patient was walking into a restaurant and tripped on the uneven sidewalk, causing her to fall forward. Patient c/o right knee pain, bilateral rib pain, left arm pain, and back pain. No LOC. Patient denies hitting her head.

## 2023-04-05 NOTE — Discharge Instructions (Signed)
You can take ibuprofen and Tylenol as needed with pain symptoms.  Please follow-up with your regular doctor for a recheck within 1 week as needed.

## 2023-04-05 NOTE — ED Triage Notes (Addendum)
Pt to ED for trip on uneven walkway, fall. C/o right knee, left knee, left hand, right elbow, lower back pain. Denies hitting head or LOC. Laceration, abrasions to left hand, right knee, right elbow, left knee. Minimal swelling to right knee.

## 2023-04-20 ENCOUNTER — Ambulatory Visit
Admission: RE | Admit: 2023-04-20 | Discharge: 2023-04-20 | Disposition: A | Discharge status: Home / Self Care | Payer: Medicare Other | Source: Ambulatory Visit | Attending: Physician Assistant

## 2023-04-20 ENCOUNTER — Other Ambulatory Visit: Payer: Self-pay | Admitting: Physician Assistant

## 2023-04-20 DIAGNOSIS — M7989 Other specified soft tissue disorders: Secondary | ICD-10-CM

## 2023-05-08 ENCOUNTER — Emergency Department: Payer: Medicare Other

## 2023-05-08 ENCOUNTER — Other Ambulatory Visit: Payer: Self-pay

## 2023-05-08 ENCOUNTER — Emergency Department
Admission: EM | Admit: 2023-05-08 | Discharge: 2023-05-08 | Disposition: A | Discharge status: Home / Self Care | Payer: Medicare Other | Attending: Emergency Medicine | Admitting: Emergency Medicine

## 2023-05-08 DIAGNOSIS — Z8673 Personal history of transient ischemic attack (TIA), and cerebral infarction without residual deficits: Secondary | ICD-10-CM | POA: Insufficient documentation

## 2023-05-08 DIAGNOSIS — Z7901 Long term (current) use of anticoagulants: Secondary | ICD-10-CM | POA: Diagnosis not present

## 2023-05-08 DIAGNOSIS — S0990XA Unspecified injury of head, initial encounter: Secondary | ICD-10-CM | POA: Diagnosis not present

## 2023-05-08 DIAGNOSIS — S6000XA Contusion of unspecified finger without damage to nail, initial encounter: Secondary | ICD-10-CM

## 2023-05-08 DIAGNOSIS — S5012XA Contusion of left forearm, initial encounter: Secondary | ICD-10-CM | POA: Diagnosis not present

## 2023-05-08 DIAGNOSIS — Y92015 Private garage of single-family (private) house as the place of occurrence of the external cause: Secondary | ICD-10-CM | POA: Insufficient documentation

## 2023-05-08 DIAGNOSIS — W19XXXA Unspecified fall, initial encounter: Secondary | ICD-10-CM

## 2023-05-08 DIAGNOSIS — S60042A Contusion of left ring finger without damage to nail, initial encounter: Secondary | ICD-10-CM | POA: Diagnosis not present

## 2023-05-08 DIAGNOSIS — S6992XA Unspecified injury of left wrist, hand and finger(s), initial encounter: Secondary | ICD-10-CM | POA: Diagnosis present

## 2023-05-08 DIAGNOSIS — W109XXA Fall (on) (from) unspecified stairs and steps, initial encounter: Secondary | ICD-10-CM | POA: Diagnosis not present

## 2023-05-08 DIAGNOSIS — S79911A Unspecified injury of right hip, initial encounter: Secondary | ICD-10-CM | POA: Diagnosis not present

## 2023-05-08 NOTE — ED Triage Notes (Signed)
Pt to ED via POV from home. Pt reports fall up steps. Pt hit front of head. Pt denies LOC. Pt is on coumadin for DVT. Pt also reports right shoulder pain, right hip pain and left finger pain.

## 2023-05-08 NOTE — ED Provider Notes (Signed)
[facility] Provider Note    Event Date/Time   First MD Initiated Contact with Patient 05/08/23 2050     (approximate)   History   Fall   HPI  Kristina Banks is a 82 y.o. female   Past medical history of TIA, DVT on anticoagulation who presents to the Emergency Department with mechanical trip and fall on the stairs of her garage today.  She did strike the front of her head.  She did not lose consciousness.  She injured her right hip.  She injured her left ring finger hand.  She was able to get up and ambulate.  Her daughter convinced her to come to the emergency department for evaluation.  She denies any other acute medical complaints.  Independent Historian contributed to assessment above: Her daughter is at bedside to corroborate information past medical history as above  External Medical Documents Reviewed: Internal medicine office visit on 04/26/2023 documenting her past medical history and recent DVT with Coumadin medication      Physical Exam   Triage Vital Signs: ED Triage Vitals  Encounter Vitals Group     BP 05/08/23 1731 (!) 168/63     Systolic BP Percentile --      Diastolic BP Percentile --      Pulse Rate 05/08/23 1731 62     Resp 05/08/23 1731 20     Temp 05/08/23 1731 98 F (36.7 C)     Temp Source 05/08/23 1731 Oral     SpO2 05/08/23 1731 98 %     Weight --      Height --      Head Circumference --      Peak Flow --      Pain Score 05/08/23 1732 9     Pain Loc --      Pain Education --      Exclude from Growth Chart --     Most recent vital signs: Vitals:   05/08/23 1731  BP: (!) 168/63  Pulse: 62  Resp: 20  Temp: 98 F (36.7 C)  SpO2: 98%    General: Awake, no distress.  CV:  Good peripheral perfusion.  Resp:  Normal effort.  Abd:  No distention.  Other:  Awake alert comfortable appearing patient in no acute distress with mild hypertension otherwise vital signs normal.  No obvious signs of head trauma,  neck supple full range of motion, torso abdomen and T and L-spine atraumatic no tenderness to palpation no obvious deformity.  Unable to range right shoulder which is chronic injury.  Bruising to the left forearm and left ring finger.  Able to range fully.  Right hip tenderness but able to range fully.   ED Results / Procedures / Treatments   Labs (all labs ordered are listed, but only abnormal results are displayed) Labs Reviewed - No data to display    RADIOLOGY I independently reviewed and interpreted CT scan of the head and see no obvious bleeding or midline shift I also reviewed radiologist's formal read.   PROCEDURES:  Critical Care performed: No  Procedures   MEDICATIONS ORDERED IN ED: Medications - No data to display  IMPRESSION / MDM / ASSESSMENT AND PLAN / ED COURSE  I reviewed the triage vital signs and the nursing notes.                                Patient's presentation  is most consistent with acute presentation with potential threat to life or bodily function.  Differential diagnosis includes, but is not limited to, mechanical slip and fall leading to blunt traumatic injury including skull fracture ICH C-spine fracture dislocation, hip fracture dislocation, finger fracture dislocation   MDM: Mechanical slip and fall, not syncope.  Patient with multiple areas of injury including head strike, left hand pain, right hip pain.  Some bruising to the left upper extremity otherwise fairly atraumatic exam.  Fortunately imaging shows no acute traumatic injuries.  Patient looks well.  No other acute medical complaints.  Discharge.      FINAL CLINICAL IMPRESSION(S) / ED DIAGNOSES   Final diagnoses:  Fall, initial encounter  Injury of head, initial encounter  Hip injury, right, initial encounter  Contusion of finger of left hand, unspecified finger, initial encounter     Rx / DC Orders   ED Discharge Orders     None        Note:  This document was  prepared using Dragon voice recognition software and may include unintentional dictation errors.    Pilar Jarvis, MD 05/08/23 2138

## 2023-05-08 NOTE — Discharge Instructions (Signed)
Pain you can take 650 mg of acetaminophen every 6 hours as needed.  Apply ice to the areas of pain and swelling.  Thank you for choosing [place] for your health care today!  Please see your primary doctor this week for a follow up appointment.   If you have any new, worsening, or unexpected symptoms call your doctor right away or come back to the emergency department for reevaluation.  It was my pleasure to care for you today.   Daneil Dan Modesto Charon, MD

## 2023-08-15 NOTE — H&P (Signed)
 Patient's anticipated LOS is less than 2 midnights, meeting these requirements: - Younger than 48 - Lives within 1 hour of care - Has a competent adult at home to recover with post-op recover - NO history of  - Chronic pain requiring opiods  - Diabetes  - Coronary Artery Disease  - Heart failure  - Heart attack  - Stroke  - DVT/VTE  - Cardiac arrhythmia  - Respiratory Failure/COPD  - Renal failure  - Anemia  - Advanced Liver disease     Kristina Banks is an 83 y.o. female.    Chief Complaint: right shoulder pain  HPI: Pt is a 83 y.o. female complaining of right shoulder pain for multiple years. Pain had continually increased since the beginning. X-rays in the clinic show end-stage arthritic changes of the right shoulder. Pt has tried various conservative treatments which have failed to alleviate their symptoms, including injections and therapy. Various options are discussed with the patient. Risks, benefits and expectations were discussed with the patient. Patient understand the risks, benefits and expectations and wishes to proceed with surgery.   PCP:  Danella Penton, MD  D/C Plans: Home  PMH: Past Medical History:  Diagnosis Date   Actinic keratosis    Arthritis    Basal cell carcinoma (BCC) of right cheek 01/03/2016   BCC (basal cell carcinoma of skin) 11/30/2020   right upper quadrant 14 cm lateral to midline - ED&C   Change in hair    Complication of anesthesia    "takes a little longer to wake up"    DDD (degenerative disc disease), cervical    Discolored nails    TOENAILS   HBP (high blood pressure)    History of dysplastic nevus 03/13/2013   Left inf. lat. buttock, left lat. hip. 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 with superficial infiltration   Stroke  (HCC) 2006   tia   TIA (transient ischemic attack)     PSH: Past Surgical History:  Procedure Laterality Date   ABDOMINAL HYSTERECTOMY     ARM SURGERY Left    BONE TUMOR   ARTHOSCOPIC ROTAOR CUFF REPAIR Left 12/16/2014   Procedure: ARTHROSCOPIC ROTATOR CUFF REPAIR;  Surgeon: Deeann Saint, MD;  Location: [facility] ORS;  Service: Orthopedics;  Laterality: Left;   bone tumor removed     Lt arm   CATARACT EXTRACTION W/PHACO Right 11/01/2015   Procedure: CATARACT EXTRACTION PHACO AND INTRAOCULAR LENS PLACEMENT (IOC);  Surgeon: Sallee Lange, MD;  Location: [facility] ORS;  Service: Ophthalmology;  Laterality: Right;  [place] 01:18 AP% 22.8 CDE 36.00 fluid pack lot # [id number]   CATARACT EXTRACTION W/PHACO Left 12/22/2015   Procedure: CATARACT EXTRACTION PHACO AND INTRAOCULAR LENS PLACEMENT (IOC);  Surgeon: Sallee Lange, MD;  Location: [facility] ORS;  Service: Ophthalmology;  Laterality: Left;  [place] 00:51 AP% 23.4 CDE 22.40 Fluid pack lot # [id number]   COLONOSCOPY     COLONOSCOPY WITH PROPOFOL N/A 04/18/2017   Procedure: COLONOSCOPY WITH PROPOFOL;  Surgeon: Scot Jun, MD;  Location: [facility] ENDOSCOPY;  Service: Endoscopy;  Laterality: N/A;   FOOT SURGERY Right    10.3.14   FRACTURE SURGERY     fractured knee Left    LUMBAR LAMINECTOMY/DECOMPRESSION MICRODISCECTOMY Left 07/07/2015   Procedure: Left  Lumbar two-three Extraforaminal Microdiskectomy;  Surgeon: Hilda Lias, MD;  Location: [facility] NEURO ORS;  Service: Neurosurgery;  Laterality: Left;  Left L2-3 Extraforaminal Microdiskectomy   TONSILLECTOMY      Social History:  reports that she has never smoked. She has never used smokeless tobacco. She reports that she does not drink alcohol and does not use drugs. BMI: Estimated body mass index is 19.85 kg/m as calculated from the following:   Height as of 04/05/23: 5\' 6"  (1.676 m).   Weight as of 04/05/23: 55.8 kg.  Lab Results  Component Value Date   ALBUMIN 4.1 01/27/2019   Diabetes: Patient  does not have a diagnosis of diabetes.     Smoking Status:   reports that she has never smoked. She has never used smokeless tobacco.    Allergies:  Allergies  Allergen Reactions   Codeine Nausea And Vomiting and Other (See Comments)    Hallucinations   Hydrocodone     Other reaction(s): Not available   Other    Tramadol Other (See Comments)    Agitation    Cephalexin Other (See Comments)    Dizziness, PATIENT DENIES   Oxycodone Other (See Comments)    Dizzy    Medications: No current facility-administered medications for this encounter.   Current Outpatient Medications  Medication Sig Dispense Refill   ALPRAZolam (XANAX) 0.25 MG tablet TAKE ONE TABLET BY MOUTH AT BEDTIME AS NEEDED FOR SLEEP     amLODipine (NORVASC) 5 MG tablet Take 5 mg by mouth daily.     aspirin 325 MG tablet Take 325 mg by mouth daily.     atorvastatin (LIPITOR) 40 MG tablet Take 1 tablet (40 mg total) by mouth daily at 6 PM. (Patient not taking: Reported on 01/31/2021) 30 tablet 0   Biotin 5000 MCG CAPS Take by mouth daily.     Calcium Carbonate-Vitamin D (CALCIUM 600 + D PO) Take 1 tablet by mouth daily.     Calcium Carbonate-Vitamin D 600-200 MG-UNIT TABS Take by mouth.     Cholecalciferol (VITAMIN D) 50 MCG (2000 UT) CAPS Take 25 Units by mouth daily.     clopidogrel (PLAVIX) 75 MG tablet Take 1 tablet (75 mg total) by mouth daily. 30 tablet 2   cyanocobalamin (,VITAMIN B-12,) 1000 MCG/ML injection      donepezil (ARICEPT) 5 MG tablet Take 1 tablet by mouth at bedtime.     doxycycline (VIBRA-TABS) 100 MG tablet Take with food. 60 tablet 0   fluticasone (FLONASE) 50 MCG/ACT nasal spray Place 2 sprays into both nostrils daily.     hydrochlorothiazide (HYDRODIURIL) 12.5 MG tablet Take 12.5 mg by mouth daily.      meloxicam (MOBIC) 15 MG tablet Take 1 tablet (15 mg total) by mouth daily. 30 tablet 0   mupirocin ointment (BACTROBAN) 2 % Apply to bites QD 22 g 1   olmesartan (BENICAR) 20 MG tablet Take  20 mg by mouth daily.     rosuvastatin (CRESTOR) 10 MG tablet Take 1 tablet (10 mg total) by mouth daily at 6 PM. 30 tablet 1   UNABLE TO FIND 20 mg. dimesartan medeomil      No results found for this or any previous visit (from the past 48 hours). No results found.  ROS: Pain with rom of the right upper extremity  Physical Exam: Alert and oriented 84 y.o. female in no acute distress Cranial nerves 2-12 intact Cervical spine: full rom with no tenderness, nv intact distally Chest: active breath  sounds bilaterally, no wheeze rhonchi or rales Heart: regular rate and rhythm, no murmur Abd: non tender non distended with active bowel sounds Hip is stable with rom  Right shoulder painful and weak rom Nv intact distally No rashes or edema distally  Assessment/Plan Assessment: right shoulder cuff arthropathy   Plan:  Patient will undergo a right reverse total shoulder by Dr. Ranell Patrick at [place] Risks benefits and expectations were discussed with the patient. Patient understand risks, benefits and expectations and wishes to proceed. Preoperative templating of the joint replacement has been completed, documented, and submitted to the Operating Room personnel in order to optimize intra-operative equipment management.   Alphonsa Overall PA-C, MPAS [facility] is now [employer] [address], [place], [place] [address] Phone: [phone] [email or web address] Facebook  [employer]

## 2023-08-23 NOTE — Progress Notes (Signed)
 COVID Vaccine Completed: yes  Date of COVID positive in last 90 days:  PCP - Bethann Punches, MD Cardiologist -   Clearance by Dr. Hyacinth Meeker in media tab dated 07/17/23  Chest x-ray -  EKG - 07/09/23 CEW Stress Test -  ECHO -  Cardiac Cath -  Pacemaker/ICD device last checked: Spinal Cord Stimulator:  Bowel Prep -   Sleep Study -  CPAP -   Fasting Blood Sugar - pre Checks Blood Sugar _____ times a day  Last dose of GLP1 agonist-  N/A GLP1 instructions:  Hold 7 days before surgery    Last dose of SGLT-2 inhibitors-  N/A SGLT-2 instructions:  Hold 3 days before surgery    Blood Thinner Instructions:  Last dose:   Time: Aspirin Instructions: ASA 325 Last Dose:  Activity level:  Can go up a flight of stairs and perform activities of daily living without stopping and without symptoms of chest pain or shortness of breath.  Able to exercise without symptoms  Unable to go up a flight of stairs without symptoms of     Anesthesia review: HTN, DVT, mild dementia, SOB, stroke  Patient denies shortness of breath, fever, cough and chest pain at PAT appointment  Patient verbalized understanding of instructions that were given to them at the PAT appointment. Patient was also instructed that they will need to review over the PAT instructions again at home before surgery.

## 2023-08-24 ENCOUNTER — Encounter (HOSPITAL_COMMUNITY): Payer: Self-pay

## 2023-08-24 ENCOUNTER — Encounter (HOSPITAL_COMMUNITY)
Admission: RE | Admit: 2023-08-24 | Discharge: 2023-08-24 | Disposition: A | Discharge status: Home / Self Care | Source: Ambulatory Visit | Attending: Orthopedic Surgery | Admitting: Orthopedic Surgery

## 2023-08-24 ENCOUNTER — Other Ambulatory Visit: Payer: Self-pay

## 2023-08-24 VITALS — BP 174/68 | HR 63 | Temp 97.5°F | Resp 14 | Ht 66.0 in | Wt 125.0 lb

## 2023-08-24 DIAGNOSIS — Z01818 Encounter for other preprocedural examination: Secondary | ICD-10-CM | POA: Diagnosis present

## 2023-08-24 DIAGNOSIS — I10 Essential (primary) hypertension: Secondary | ICD-10-CM | POA: Insufficient documentation

## 2023-08-24 LAB — CBC
HCT: 40.4 % (ref 36.0–46.0)
Hemoglobin: 12.8 g/dL (ref 12.0–15.0)
MCH: 29.9 pg (ref 26.0–34.0)
MCHC: 31.7 g/dL (ref 30.0–36.0)
MCV: 94.4 fL (ref 80.0–100.0)
Platelets: 247 10*3/uL (ref 150–400)
RBC: 4.28 MIL/uL (ref 3.87–5.11)
RDW: 13.8 % (ref 11.5–15.5)
WBC: 8.7 10*3/uL (ref 4.0–10.5)
nRBC: 0 % (ref 0.0–0.2)

## 2023-08-24 LAB — BASIC METABOLIC PANEL WITH GFR
Anion gap: 7 (ref 5–15)
BUN: 24 mg/dL — ABNORMAL HIGH (ref 8–23)
CO2: 25 mmol/L (ref 22–32)
Calcium: 9.6 mg/dL (ref 8.9–10.3)
Chloride: 100 mmol/L (ref 98–111)
Creatinine, Ser: 0.81 mg/dL (ref 0.44–1.00)
GFR, Estimated: >60 mL/min (ref >60)
Glucose, Bld: 91 mg/dL (ref 70–99)
Potassium: 4.1 mmol/L (ref 3.5–5.1)
Sodium: 132 mmol/L — ABNORMAL LOW (ref 135–145)

## 2023-08-24 LAB — SURGICAL PCR SCREEN
MRSA, PCR: NEGATIVE
Staphylococcus aureus: POSITIVE — AB

## 2023-08-24 NOTE — Patient Instructions (Signed)
 SURGICAL WAITING ROOM VISITATION  Patients having surgery or a procedure may have no more than 2 support people in the waiting area - these visitors may rotate.    Children under the age of 63 must have an adult with them who is not the patient.  Due to an increase in RSV and influenza rates and associated hospitalizations, children ages 45 and under may not visit patients in [facility] hospitals.  Visitors with respiratory illnesses are discouraged from visiting and should remain at home.  If the patient needs to stay at the hospital during part of their recovery, the visitor guidelines for inpatient rooms apply. Pre-op nurse will coordinate an appropriate time for 1 support person to accompany patient in pre-op.  This support person may not rotate.    Please refer to the [facility] website for the visitor guidelines for Inpatients (after your surgery is over and you are in a regular room).    Your procedure is scheduled on: 08/27/23   Report to [facility] Main Entrance    Report to admitting at 10:00 AM   Call this number if you have problems the morning of surgery [phone]   Do not eat food :After Midnight.   After Midnight you may have the following liquids until 9:30 AM DAY OF SURGERY  Water Non-Citrus Juices (without pulp, NO RED-Apple, White grape, White cranberry) Black Coffee (NO MILK/CREAM OR CREAMERS, sugar ok)  Clear Tea (NO MILK/CREAM OR CREAMERS, sugar ok) regular and decaf                             Plain Jell-O (NO RED)                                           Fruit ices (not with fruit pulp, NO RED)                                     Popsicles (NO RED)                                                               Sports drinks like Gatorade (NO RED)    The day of surgery:  Drink ONE (1) Pre-Surgery G2 at 9:30 AM the morning of surgery. Drink in one sitting. Do not sip.  This drink was given to you during your hospital  pre-op appointment  visit. Nothing else to drink after completing the  Pre-Surgery G2.          If you have questions, please contact your surgeon's office.   FOLLOW BOWEL PREP AND ANY ADDITIONAL PRE OP INSTRUCTIONS YOU RECEIVED FROM YOUR SURGEON'S OFFICE!!!     Oral Hygiene is also important to reduce your risk of infection.                                    Remember - BRUSH YOUR TEETH THE MORNING OF SURGERY WITH YOUR REGULAR TOOTHPASTE  DENTURES WILL BE  REMOVED PRIOR TO SURGERY PLEASE DO NOT APPLY "Poly grip" OR ADHESIVES!!!   Stop all vitamins and herbal supplements 7 days before surgery.   Take these medicines the morning of surgery with A SIP OF WATER: Amlodipine                               You may not have any metal on your body including hair pins, jewelry, and body piercing             Do not wear make-up, lotions, powders, perfumes, or deodorant  Do not wear nail polish including gel and S&S, artificial/acrylic nails, or any other type of covering on natural nails including finger and toenails. If you have artificial nails, gel coating, etc. that needs to be removed by a nail salon please have this removed prior to surgery or surgery may need to be canceled/ delayed if the surgeon/ anesthesia feels like they are unable to be safely monitored.   Do not shave  48 hours prior to surgery.    Do not bring valuables to the hospital. [facility] IS NOT             RESPONSIBLE   FOR VALUABLES.   Contacts, glasses, dentures or bridgework may not be worn into surgery.   Bring small overnight bag day of surgery.   DO NOT BRING YOUR HOME MEDICATIONS TO THE HOSPITAL. PHARMACY WILL DISPENSE MEDICATIONS LISTED ON YOUR MEDICATION LIST TO YOU DURING YOUR ADMISSION IN THE HOSPITAL!    Special Instructions: Bring a copy of your healthcare power of attorney and living will documents the day of surgery if you haven't scanned them before.              Please read over the following fact sheets you were  given: IF YOU HAVE QUESTIONS ABOUT YOUR PRE-OP INSTRUCTIONS PLEASE CALL [name]    If you received a COVID test during your pre-op visit  it is requested that you wear a mask when out in public, stay away from anyone that may not be feeling well and notify your surgeon if you develop symptoms. If you test positive for Covid or have been in contact with anyone that has tested positive in the last 10 days please notify you surgeon.      Pre-operative 5 CHG Bath Instructions   You can play a key role in reducing the risk of infection after surgery. Your skin needs to be as free of germs as possible. You can reduce the number of germs on your skin by washing with CHG (chlorhexidine gluconate) soap before surgery. CHG is an antiseptic soap that kills germs and continues to kill germs even after washing.   DO NOT use if you have an allergy to chlorhexidine/CHG or antibacterial soaps. If your skin becomes reddened or irritated, stop using the CHG and notify one of our RNs at [phone].   Please shower with the CHG soap starting 4 days before surgery using the following schedule:     Please keep in mind the following:  DO NOT shave, including legs and underarms, starting the day of your first shower.   You may shave your face at any point before/day of surgery.  Place clean sheets on your bed the day you start using CHG soap. Use a clean washcloth (not used since being washed) for each shower. DO NOT sleep with pets once you start using the CHG.  CHG Shower Instructions:  If you choose to wash your hair and private area, wash first with your normal shampoo/soap.  After you use shampoo/soap, rinse your hair and body thoroughly to remove shampoo/soap residue.  Turn the water OFF and apply about 3 tablespoons (45 ml) of CHG soap to a CLEAN washcloth.  Apply CHG soap ONLY FROM YOUR NECK DOWN TO YOUR TOES (washing for 3-5 minutes)  DO NOT use CHG soap on face, private areas, open  wounds, or sores.  Pay special attention to the area where your surgery is being performed.  If you are having back surgery, having someone wash your back for you may be helpful. Wait 2 minutes after CHG soap is applied, then you may rinse off the CHG soap.  Pat dry with a clean towel  Put on clean clothes/pajamas   If you choose to wear lotion, please use ONLY the CHG-compatible lotions on the back of this paper.     Additional instructions for the day of surgery: DO NOT APPLY any lotions, deodorants, cologne, or perfumes.   Put on clean/comfortable clothes.  Brush your teeth.  Ask your nurse before applying any prescription medications to the skin.      CHG Compatible Lotions   Aveeno Moisturizing lotion  Cetaphil Moisturizing Cream  Cetaphil Moisturizing Lotion  Clairol Herbal Essence Moisturizing Lotion, Dry Skin  Clairol Herbal Essence Moisturizing Lotion, Extra Dry Skin  Clairol Herbal Essence Moisturizing Lotion, Normal Skin  Curel Age Defying Therapeutic Moisturizing Lotion with Alpha Hydroxy  Curel Extreme Care Body Lotion  Curel Soothing Hands Moisturizing Hand Lotion  Curel Therapeutic Moisturizing Cream, Fragrance-Free  Curel Therapeutic Moisturizing Lotion, Fragrance-Free  Curel Therapeutic Moisturizing Lotion, Original Formula  Eucerin Daily Replenishing Lotion  Eucerin Dry Skin Therapy Plus Alpha Hydroxy Crme  Eucerin Dry Skin Therapy Plus Alpha Hydroxy Lotion  Eucerin Original Crme  Eucerin Original Lotion  Eucerin Plus Crme Eucerin Plus Lotion  Eucerin TriLipid Replenishing Lotion  Keri Anti-Bacterial Hand Lotion  Keri Deep Conditioning Original Lotion Dry Skin Formula Softly Scented  Keri Deep Conditioning Original Lotion, Fragrance Free Sensitive Skin Formula  Keri Lotion Fast Absorbing Fragrance Free Sensitive Skin Formula  Keri Lotion Fast Absorbing Softly Scented Dry Skin Formula  Keri Original Lotion  Keri Skin Renewal Lotion Keri Silky Smooth  Lotion  Keri Silky Smooth Sensitive Skin Lotion  Nivea Body Creamy Conditioning Oil  Nivea Body Extra Enriched Lotion  Nivea Body Original Lotion  Nivea Body Sheer Moisturizing Lotion Nivea Crme  Nivea Skin Firming Lotion  NutraDerm 30 Skin Lotion  NutraDerm Skin Lotion  NutraDerm Therapeutic Skin Cream  NutraDerm Therapeutic Skin Lotion  ProShield Protective Hand Cream  Provon moisturizing lotion   Incentive Spirometer  An incentive spirometer is a tool that can help keep your lungs clear and active. This tool measures how well you are filling your lungs with each breath. Taking long deep breaths may help reverse or decrease the chance of developing breathing (pulmonary) problems (especially infection) following: A long period of time when you are unable to move or be active. BEFORE THE PROCEDURE  If the spirometer includes an indicator to show your best effort, your nurse or respiratory therapist will set it to a desired goal. If possible, sit up straight or lean slightly forward. Try not to slouch. Hold the incentive spirometer in an upright position. INSTRUCTIONS FOR USE  Sit on the edge of your bed if possible, or sit up as far as you can in bed or  on a chair. Hold the incentive spirometer in an upright position. Breathe out normally. Place the mouthpiece in your mouth and seal your lips tightly around it. Breathe in slowly and as deeply as possible, raising the piston or the ball toward the top of the column. Hold your breath for 3-5 seconds or for as long as possible. Allow the piston or ball to fall to the bottom of the column. Remove the mouthpiece from your mouth and breathe out normally. Rest for a few seconds and repeat Steps 1 through 7 at least 10 times every 1-2 hours when you are awake. Take your time and take a few normal breaths between deep breaths. The spirometer may include an indicator to show your best effort. Use the indicator as a goal to work toward during  each repetition. After each set of 10 deep breaths, practice coughing to be sure your lungs are clear. If you have an incision (the cut made at the time of surgery), support your incision when coughing by placing a pillow or rolled up towels firmly against it. Once you are able to get out of bed, walk around indoors and cough well. You may stop using the incentive spirometer when instructed by your caregiver.  RISKS AND COMPLICATIONS Take your time so you do not get dizzy or light-headed. If you are in pain, you may need to take or ask for pain medication before doing incentive spirometry. It is harder to take a deep breath if you are having pain. AFTER USE Rest and breathe slowly and easily. It can be helpful to keep track of a log of your progress. Your caregiver can provide you with a simple table to help with this. If you are using the spirometer at home, follow these instructions: SEEK MEDICAL CARE IF:  You are having difficultly using the spirometer. You have trouble using the spirometer as often as instructed. Your pain medication is not giving enough relief while using the spirometer. You develop fever of 100.5 F (38.1 C) or higher. SEEK IMMEDIATE MEDICAL CARE IF:  You cough up bloody sputum that had not been present before. You develop fever of 102 F (38.9 C) or greater. You develop worsening pain at or near the incision site. MAKE SURE YOU:  Understand these instructions. Will watch your condition. Will get help right away if you are not doing well or get worse. Document Released: 09/11/2006 Document Revised: 07/24/2011 Document Reviewed: 11/12/2006 Exit[employer] Patient Information 2014 [place] [employer].   ________________________________________________________________________  [facility]- Preparing for Total Shoulder Arthroplasty    Before surgery, you can play an important role. Because skin is not sterile, your skin needs to be as free of germs as possible. You can reduce  the number of germs on your skin by using the following products. Benzoyl Peroxide Gel Reduces the number of germs present on the skin Applied twice a day to shoulder area starting two days before surgery    ==================================================================  Please follow these instructions carefully:  BENZOYL PEROXIDE 5% GEL  Please do not use if you have an allergy to benzoyl peroxide.   If your skin becomes reddened/irritated stop using the benzoyl peroxide.  Starting two days before surgery, apply as follows: Apply benzoyl peroxide in the morning and at night. Apply after taking a shower. If you are not taking a shower clean entire shoulder front, back, and side along with the armpit with a clean wet washcloth.  Place a quarter-sized dollop on your shoulder and rub in  thoroughly, making sure to cover the front, back, and side of your shoulder, along with the armpit.   2 days before ____ AM   ____ PM              1 day before ____ AM   ____ PM                         Do this twice a day for two days.  (Last application is the night before surgery, AFTER using the CHG soap as described below).  Do NOT apply benzoyl peroxide gel on the day of surgery.

## 2023-08-24 NOTE — Progress Notes (Signed)
[facility] + results routed to Dr. Norris. 

## 2023-09-03 ENCOUNTER — Ambulatory Visit: Admitting: Podiatry

## 2023-09-04 NOTE — Anesthesia Preprocedure Evaluation (Addendum)
 Anesthesia Evaluation  Patient identified by MRN, date of birth, ID band Patient awake    Reviewed: Allergy & Precautions, NPO status , Patient's Chart, lab work & pertinent test results  History of Anesthesia Complications (+) PONV and history of anesthetic complications  Airway Mallampati: II  TM Distance: >3 FB Neck ROM: Full    Dental no notable dental hx. (+) Teeth Intact, Dental Advisory Given   Pulmonary neg pulmonary ROS   Pulmonary exam normal breath sounds clear to auscultation       Cardiovascular hypertension, Pt. on medications (-) angina (-) Past MI Normal cardiovascular exam Rhythm:Regular Rate:Normal     Neuro/Psych TIA   GI/Hepatic   Endo/Other    Renal/GU Lab Results      Component                Value               Date                          K                        4.1                 08/24/2023                   BUN                      24 (H)              08/24/2023                CREATININE               0.81                08/24/2023                              GLUCOSE                  91                  08/24/2023                Musculoskeletal  (+) Arthritis , Osteoarthritis,    Abdominal   Peds  Hematology On Plavix  Lab Results      Component                Value               Date                      WBC                      8.7                 08/24/2023                HGB                      12.8                08/24/2023                HCT  40.4                08/24/2023                MCV                      94.4                08/24/2023                PLT                      247                 08/24/2023              Anesthesia Other Findings All: Codeine, hydrocodeine, tramadol  cephalexin  oxycodone   Reproductive/Obstetrics                             Anesthesia Physical Anesthesia Plan  ASA: 3  Anesthesia Plan:  General   Post-op Pain Management: Precedex and Tylenol  PO (pre-op)*   Induction: Intravenous  PONV Risk Score and Plan: 4 or greater and Treatment may vary due to age or medical condition, Ondansetron , Propofol  infusion and TIVA  Airway Management Planned: Oral ETT  Additional Equipment: None  Intra-op Plan:   Post-operative Plan:   Informed Consent: I have reviewed the patients History and Physical, chart, labs and discussed the procedure including the risks, benefits and alternatives for the proposed anesthesia with the patient or authorized representative who has indicated his/her understanding and acceptance.     Dental advisory given  Plan Discussed with: CRNA and Surgeon  Anesthesia Plan Comments: (TIVA GA w R ISB)       Anesthesia Quick Evaluation

## 2023-09-05 ENCOUNTER — Ambulatory Visit (HOSPITAL_COMMUNITY): Payer: Self-pay | Admitting: Physician Assistant

## 2023-09-05 ENCOUNTER — Other Ambulatory Visit: Payer: Self-pay

## 2023-09-05 ENCOUNTER — Encounter (HOSPITAL_COMMUNITY)
Admission: RE | Disposition: A | Discharge status: Home / Self Care | Payer: Self-pay | Source: Ambulatory Visit | Attending: Orthopedic Surgery

## 2023-09-05 ENCOUNTER — Encounter (HOSPITAL_COMMUNITY): Payer: Self-pay | Admitting: Orthopedic Surgery

## 2023-09-05 ENCOUNTER — Observation Stay (HOSPITAL_COMMUNITY)

## 2023-09-05 ENCOUNTER — Ambulatory Visit (HOSPITAL_COMMUNITY): Admitting: Anesthesiology

## 2023-09-05 ENCOUNTER — Observation Stay (HOSPITAL_COMMUNITY)
Admission: RE | Admit: 2023-09-05 | Discharge: 2023-09-06 | Disposition: A | Discharge status: Home / Self Care | Payer: PRIVATE HEALTH INSURANCE | Source: Ambulatory Visit | Attending: Orthopedic Surgery | Admitting: Orthopedic Surgery

## 2023-09-05 DIAGNOSIS — Z85828 Personal history of other malignant neoplasm of skin: Secondary | ICD-10-CM | POA: Diagnosis not present

## 2023-09-05 DIAGNOSIS — I10 Essential (primary) hypertension: Secondary | ICD-10-CM | POA: Diagnosis not present

## 2023-09-05 DIAGNOSIS — E785 Hyperlipidemia, unspecified: Secondary | ICD-10-CM | POA: Diagnosis not present

## 2023-09-05 DIAGNOSIS — Z8673 Personal history of transient ischemic attack (TIA), and cerebral infarction without residual deficits: Secondary | ICD-10-CM | POA: Diagnosis not present

## 2023-09-05 DIAGNOSIS — M75101 Unspecified rotator cuff tear or rupture of right shoulder, not specified as traumatic: Secondary | ICD-10-CM

## 2023-09-05 DIAGNOSIS — Z79899 Other long term (current) drug therapy: Secondary | ICD-10-CM | POA: Insufficient documentation

## 2023-09-05 DIAGNOSIS — Z7982 Long term (current) use of aspirin: Secondary | ICD-10-CM | POA: Insufficient documentation

## 2023-09-05 DIAGNOSIS — Z96611 Presence of right artificial shoulder joint: Principal | ICD-10-CM

## 2023-09-05 DIAGNOSIS — Z7902 Long term (current) use of antithrombotics/antiplatelets: Secondary | ICD-10-CM | POA: Diagnosis not present

## 2023-09-05 DIAGNOSIS — M19011 Primary osteoarthritis, right shoulder: Secondary | ICD-10-CM | POA: Insufficient documentation

## 2023-09-05 SURGERY — ARTHROPLASTY, SHOULDER, TOTAL, REVERSE
Anesthesia: General | Site: Shoulder | Laterality: Right

## 2023-09-05 MED ORDER — DONEPEZIL HCL 10 MG PO TABS: 10.0000 mg | ORAL_TABLET | Freq: Every day | ORAL | Status: DC

## 2023-09-05 MED ORDER — METOCLOPRAMIDE HCL 5 MG PO TABS: 5.0000 mg | ORAL_TABLET | Freq: Three times a day (TID) | ORAL | Status: DC | PRN

## 2023-09-05 MED ORDER — LIDOCAINE HCL (CARDIAC) PF 100 MG/5ML IV SOSY
PREFILLED_SYRINGE | INTRAVENOUS | Status: DC | PRN
  Administered 2023-09-05: 40 mg via INTRAVENOUS

## 2023-09-05 MED ORDER — LACTATED RINGERS IV SOLN: INTRAVENOUS | Status: DC

## 2023-09-05 MED ORDER — PHENOL 1.4 % MT LIQD: 1.0000 | OROMUCOSAL | Status: DC | PRN

## 2023-09-05 MED ORDER — LIDOCAINE HCL (PF) 2 % IJ SOLN
INTRAMUSCULAR | Status: AC
  Filled 2023-09-05: qty 5

## 2023-09-05 MED ORDER — MIDAZOLAM HCL 2 MG/2ML IJ SOLN
INTRAMUSCULAR | Status: AC
  Filled 2023-09-05: qty 2

## 2023-09-05 MED ORDER — MUPIROCIN 2 % EX OINT
TOPICAL_OINTMENT | Freq: Every day | CUTANEOUS | Status: DC
  Filled 2023-09-05: qty 22

## 2023-09-05 MED ORDER — ATORVASTATIN CALCIUM 40 MG PO TABS: 40.0000 mg | ORAL_TABLET | Freq: Every day | ORAL | Status: DC

## 2023-09-05 MED ORDER — FENTANYL CITRATE PF 50 MCG/ML IJ SOSY
50.0000 ug | PREFILLED_SYRINGE | INTRAMUSCULAR | Status: DC
  Administered 2023-09-05: 50 ug via INTRAVENOUS
  Filled 2023-09-05: qty 2

## 2023-09-05 MED ORDER — MUPIROCIN 2 % EX OINT: 1.0000 | TOPICAL_OINTMENT | Freq: Two times a day (BID) | CUTANEOUS | 0 refills | Status: AC

## 2023-09-05 MED ORDER — CHLORHEXIDINE GLUCONATE 0.12 % MT SOLN
15.0000 mL | Freq: Once | OROMUCOSAL | Status: AC
  Administered 2023-09-05: 15 mL via OROMUCOSAL

## 2023-09-05 MED ORDER — ORAL CARE MOUTH RINSE: 15.0000 mL | Freq: Once | OROMUCOSAL | Status: AC

## 2023-09-05 MED ORDER — FENTANYL CITRATE (PF) 100 MCG/2ML IJ SOLN
INTRAMUSCULAR | Status: AC
Start: 2023-09-05 — End: ?
  Filled 2023-09-05: qty 2

## 2023-09-05 MED ORDER — STERILE WATER FOR IRRIGATION IR SOLN
Status: DC | PRN
  Administered 2023-09-05: 1000 mL

## 2023-09-05 MED ORDER — METOCLOPRAMIDE HCL 5 MG/ML IJ SOLN: 5.0000 mg | Freq: Three times a day (TID) | INTRAMUSCULAR | Status: DC | PRN

## 2023-09-05 MED ORDER — DEXAMETHASONE SODIUM PHOSPHATE 10 MG/ML IJ SOLN
INTRAMUSCULAR | Status: AC
  Filled 2023-09-05: qty 1

## 2023-09-05 MED ORDER — CHLORHEXIDINE GLUCONATE 4 % EX SOLN
1.0000 | CUTANEOUS | 1 refills | Status: AC
Start: 2023-09-05 — End: ?

## 2023-09-05 MED ORDER — BIOTIN 10 MG PO TABS: 10.0000 mg | ORAL_TABLET | Freq: Every day | ORAL | Status: DC

## 2023-09-05 MED ORDER — TRANEXAMIC ACID-NACL 1000-0.7 MG/100ML-% IV SOLN
1000.0000 mg | Freq: Once | INTRAVENOUS | Status: AC
  Administered 2023-09-05: 1000 mg via INTRAVENOUS
  Filled 2023-09-05: qty 100

## 2023-09-05 MED ORDER — EPHEDRINE SULFATE-NACL 50-0.9 MG/10ML-% IV SOSY
PREFILLED_SYRINGE | INTRAVENOUS | Status: DC | PRN
  Administered 2023-09-05: 10 mg via INTRAVENOUS

## 2023-09-05 MED ORDER — SODIUM CHLORIDE 0.9% FLUSH: 3.0000 mL | INTRAVENOUS | Status: DC | PRN

## 2023-09-05 MED ORDER — VANCOMYCIN HCL IN DEXTROSE 1-5 GM/200ML-% IV SOLN
1000.0000 mg | Freq: Two times a day (BID) | INTRAVENOUS | Status: AC
  Administered 2023-09-05: 1000 mg via INTRAVENOUS
  Filled 2023-09-05: qty 200

## 2023-09-05 MED ORDER — VANCOMYCIN HCL IN DEXTROSE 1-5 GM/200ML-% IV SOLN
1000.0000 mg | INTRAVENOUS | Status: AC
  Administered 2023-09-05: 1000 mg via INTRAVENOUS
  Filled 2023-09-05: qty 200

## 2023-09-05 MED ORDER — ONDANSETRON HCL 4 MG/2ML IJ SOLN: 4.0000 mg | Freq: Four times a day (QID) | INTRAMUSCULAR | Status: DC | PRN

## 2023-09-05 MED ORDER — TRAMADOL HCL 50 MG PO TABS: 50.0000 mg | ORAL_TABLET | Freq: Four times a day (QID) | ORAL | 0 refills | Status: AC | PRN

## 2023-09-05 MED ORDER — ONDANSETRON HCL 4 MG/2ML IJ SOLN
INTRAMUSCULAR | Status: AC
  Filled 2023-09-05: qty 2

## 2023-09-05 MED ORDER — ONDANSETRON HCL 4 MG/2ML IJ SOLN: 4.0000 mg | Freq: Once | INTRAMUSCULAR | Status: DC | PRN

## 2023-09-05 MED ORDER — BUPIVACAINE-EPINEPHRINE (PF) 0.25% -1:200000 IJ SOLN
INTRAMUSCULAR | Status: AC
  Filled 2023-09-05: qty 30

## 2023-09-05 MED ORDER — 0.9 % SODIUM CHLORIDE (POUR BTL) OPTIME
TOPICAL | Status: DC | PRN
  Administered 2023-09-05: 1000 mL

## 2023-09-05 MED ORDER — MENTHOL 3 MG MT LOZG: 1.0000 | LOZENGE | OROMUCOSAL | Status: DC | PRN

## 2023-09-05 MED ORDER — BUPIVACAINE-EPINEPHRINE (PF) 0.25% -1:200000 IJ SOLN
INTRAMUSCULAR | Status: DC | PRN
Start: 2023-09-05 — End: 2023-09-05
  Administered 2023-09-05: 12 mL

## 2023-09-05 MED ORDER — IRBESARTAN 150 MG PO TABS
150.0000 mg | ORAL_TABLET | Freq: Every day | ORAL | Status: DC
Start: 2023-09-06 — End: 2023-09-06
  Administered 2023-09-06: 150 mg via ORAL
  Filled 2023-09-05: qty 1

## 2023-09-05 MED ORDER — ROSUVASTATIN CALCIUM 10 MG PO TABS
10.0000 mg | ORAL_TABLET | Freq: Every day | ORAL | Status: DC
  Administered 2023-09-05: 10 mg via ORAL
  Filled 2023-09-05: qty 1

## 2023-09-05 MED ORDER — CALCIUM CARB-CHOLECALCIFEROL 600-5 MG-MCG PO TABS: ORAL_TABLET | Freq: Every day | ORAL | Status: DC

## 2023-09-05 MED ORDER — ACETAMINOPHEN 10 MG/ML IV SOLN: 1000.0000 mg | Freq: Once | INTRAVENOUS | Status: DC | PRN

## 2023-09-05 MED ORDER — PROPOFOL 10 MG/ML IV BOLUS
INTRAVENOUS | Status: AC
  Filled 2023-09-05: qty 20

## 2023-09-05 MED ORDER — ONDANSETRON HCL 4 MG/2ML IJ SOLN
INTRAMUSCULAR | Status: DC | PRN
  Administered 2023-09-05: 4 mg via INTRAVENOUS

## 2023-09-05 MED ORDER — BUPIVACAINE HCL (PF) 0.5 % IJ SOLN
INTRAMUSCULAR | Status: DC | PRN
  Administered 2023-09-05: 15 mL via PERINEURAL

## 2023-09-05 MED ORDER — TRAMADOL HCL 50 MG PO TABS: 50.0000 mg | ORAL_TABLET | Freq: Four times a day (QID) | ORAL | Status: DC | PRN

## 2023-09-05 MED ORDER — PROPOFOL 10 MG/ML IV BOLUS
INTRAVENOUS | Status: DC | PRN
  Administered 2023-09-05: 30 mg via INTRAVENOUS
  Administered 2023-09-05: 70 mg via INTRAVENOUS
  Administered 2023-09-05 (×2): 30 mg via INTRAVENOUS
  Administered 2023-09-05: 40 ug/kg/min via INTRAVENOUS

## 2023-09-05 MED ORDER — POLYETHYLENE GLYCOL 3350 17 G PO PACK: 17.0000 g | PACK | Freq: Every day | ORAL | Status: DC | PRN

## 2023-09-05 MED ORDER — OYSTER SHELL CALCIUM/D3 500-5 MG-MCG PO TABS
1.0000 | ORAL_TABLET | Freq: Every day | ORAL | Status: DC
  Administered 2023-09-06: 1 via ORAL
  Filled 2023-09-05: qty 1

## 2023-09-05 MED ORDER — SUCCINYLCHOLINE CHLORIDE 200 MG/10ML IV SOSY
PREFILLED_SYRINGE | INTRAVENOUS | Status: DC | PRN
  Administered 2023-09-05: 100 mg via INTRAVENOUS

## 2023-09-05 MED ORDER — BUPIVACAINE LIPOSOME 1.3 % IJ SUSP
INTRAMUSCULAR | Status: DC | PRN
  Administered 2023-09-05: 10 mL via PERINEURAL

## 2023-09-05 MED ORDER — ACETAMINOPHEN 325 MG PO TABS: 325.0000 mg | ORAL_TABLET | Freq: Four times a day (QID) | ORAL | Status: DC | PRN

## 2023-09-05 MED ORDER — TRANEXAMIC ACID-NACL 1000-0.7 MG/100ML-% IV SOLN
1000.0000 mg | INTRAVENOUS | Status: AC
  Administered 2023-09-05: 1000 mg via INTRAVENOUS
  Filled 2023-09-05: qty 100

## 2023-09-05 MED ORDER — FENTANYL CITRATE PF 50 MCG/ML IJ SOSY: 25.0000 ug | PREFILLED_SYRINGE | INTRAMUSCULAR | Status: DC | PRN

## 2023-09-05 MED ORDER — ASPIRIN 325 MG PO TABS
325.0000 mg | ORAL_TABLET | Freq: Every day | ORAL | Status: DC
  Administered 2023-09-06: 325 mg via ORAL
  Filled 2023-09-05: qty 1

## 2023-09-05 MED ORDER — DOCUSATE SODIUM 100 MG PO CAPS
100.0000 mg | ORAL_CAPSULE | Freq: Two times a day (BID) | ORAL | Status: DC
  Administered 2023-09-05 – 2023-09-06 (×2): 100 mg via ORAL
  Filled 2023-09-05 (×2): qty 1

## 2023-09-05 MED ORDER — ACETAMINOPHEN 500 MG PO TABS
1000.0000 mg | ORAL_TABLET | Freq: Three times a day (TID) | ORAL | 1 refills | Status: AC | PRN
Start: 2023-09-05 — End: 2024-09-04

## 2023-09-05 MED ORDER — DEXAMETHASONE SODIUM PHOSPHATE 10 MG/ML IJ SOLN
INTRAMUSCULAR | Status: DC | PRN
  Administered 2023-09-05: 5 mg via INTRAVENOUS

## 2023-09-05 MED ORDER — VITAMIN D 25 MCG (1000 UNIT) PO TABS
2000.0000 [IU] | ORAL_TABLET | Freq: Every day | ORAL | Status: DC
Start: 2023-09-06 — End: 2023-09-06
  Administered 2023-09-06: 2000 [IU] via ORAL
  Filled 2023-09-05: qty 2

## 2023-09-05 MED ORDER — ONDANSETRON HCL 4 MG PO TABS: 4.0000 mg | ORAL_TABLET | Freq: Four times a day (QID) | ORAL | Status: DC | PRN

## 2023-09-05 MED ORDER — SODIUM CHLORIDE 0.9% FLUSH
3.0000 mL | Freq: Two times a day (BID) | INTRAVENOUS | Status: DC
  Administered 2023-09-06: 3 mL via INTRAVENOUS

## 2023-09-05 MED ORDER — AMLODIPINE BESYLATE 5 MG PO TABS
5.0000 mg | ORAL_TABLET | Freq: Every day | ORAL | Status: DC
  Administered 2023-09-06: 5 mg via ORAL
  Filled 2023-09-05: qty 1

## 2023-09-05 MED ORDER — FENTANYL CITRATE (PF) 100 MCG/2ML IJ SOLN
INTRAMUSCULAR | Status: DC | PRN
  Administered 2023-09-05 (×4): 25 ug via INTRAVENOUS

## 2023-09-05 SURGICAL SUPPLY — 57 items
BAG COUNTER SPONGE SURGICOUNT (BAG) IMPLANT
BAG ZIPLOCK 12X15 (MISCELLANEOUS) IMPLANT
BIT DRILL 1.6MX128 (BIT) IMPLANT
BIT DRILL 170X2.5X (BIT) IMPLANT
BLADE SAG 18X100X1.27 (BLADE) ×1 IMPLANT
COVER BACK TABLE 60X90IN (DRAPES) ×1 IMPLANT
COVER SURGICAL LIGHT HANDLE (MISCELLANEOUS) ×1 IMPLANT
DRAPE INCISE IOBAN 66X45 STRL (DRAPES) ×1 IMPLANT
DRAPE SHEET LG 3/4 BI-LAMINATE (DRAPES) ×1 IMPLANT
DRAPE SURG ORHT 6 SPLT 77X108 (DRAPES) ×2 IMPLANT
DRAPE TOP 10253 STERILE (DRAPES) ×1 IMPLANT
DRAPE U-SHAPE 47X51 STRL (DRAPES) ×1 IMPLANT
DRSG ADAPTIC 3X8 NADH LF (GAUZE/BANDAGES/DRESSINGS) ×1 IMPLANT
DRSG AQUACEL AG ADV 3.5X10 (GAUZE/BANDAGES/DRESSINGS) ×1 IMPLANT
DURAPREP 26ML APPLICATOR (WOUND CARE) ×1 IMPLANT
ECCENTRIC EPIPHYSI MODULAR SZ1 (Trauma) IMPLANT
ELECT BLADE TIP CTD 4 INCH (ELECTRODE) ×1 IMPLANT
ELECT NDL TIP 2.8 STRL (NEEDLE) ×1 IMPLANT
ELECT NEEDLE TIP 2.8 STRL (NEEDLE) ×1 IMPLANT
ELECT PENCIL ROCKER SW 15FT (MISCELLANEOUS) ×1 IMPLANT
ELECT REM PT RETURN 15FT ADLT (MISCELLANEOUS) ×1 IMPLANT
FACESHIELD WRAPAROUND (MASK) ×2 IMPLANT
FACESHIELD WRAPAROUND OR TEAM (MASK) ×1 IMPLANT
GAUZE PAD ABD 8X10 STRL (GAUZE/BANDAGES/DRESSINGS) ×1 IMPLANT
GAUZE SPONGE 4X4 12PLY STRL (GAUZE/BANDAGES/DRESSINGS) ×1 IMPLANT
GLENOSPHERE DELTA XTEND LAT 38 (Miscellaneous) IMPLANT
GLOVE BIOGEL PI IND STRL 7.5 (GLOVE) ×1 IMPLANT
GLOVE BIOGEL PI IND STRL 8.5 (GLOVE) ×1 IMPLANT
GLOVE ORTHO TXT STRL SZ7.5 (GLOVE) ×1 IMPLANT
GLOVE SURG ORTHO 8.5 STRL (GLOVE) ×1 IMPLANT
GOWN STRL REUS W/ TWL XL LVL3 (GOWN DISPOSABLE) ×2 IMPLANT
KIT BASIN OR (CUSTOM PROCEDURE TRAY) ×1 IMPLANT
KIT TURNOVER KIT A (KITS) ×1 IMPLANT
MANIFOLD NEPTUNE II (INSTRUMENTS) ×1 IMPLANT
METAGLENE DELTA EXTEND (Trauma) IMPLANT
NDL MAYO CATGUT SZ4 TPR NDL (NEEDLE) IMPLANT
NEEDLE MAYO CATGUT SZ4 (NEEDLE) ×1 IMPLANT
NS IRRIG 1000ML POUR BTL (IV SOLUTION) ×1 IMPLANT
PACK SHOULDER (CUSTOM PROCEDURE TRAY) ×1 IMPLANT
PIN GUIDE 1.2 (PIN) IMPLANT
PIN GUIDE GLENOPHERE 1.5MX300M (PIN) IMPLANT
PIN METAGLENE 2.5 (PIN) IMPLANT
RESTRAINT HEAD UNIVERSAL NS (MISCELLANEOUS) ×1 IMPLANT
SCREW 4.5X36MM (Screw) IMPLANT
SCREW LOCK 42 (Screw) IMPLANT
SLING ARM FOAM STRAP LRG (SOFTGOODS) IMPLANT
SPACER 38 PLUS 3 (Spacer) IMPLANT
SPIKE FLUID TRANSFER (MISCELLANEOUS) ×1 IMPLANT
STEM DELTA DIA 10 HA (Stem) IMPLANT
STRIP CLOSURE SKIN 1/2X4 (GAUZE/BANDAGES/DRESSINGS) ×1 IMPLANT
SUT MNCRL AB 4-0 PS2 18 (SUTURE) ×1 IMPLANT
SUT VIC AB 0 CT1 36 (SUTURE) ×1 IMPLANT
SUT VIC AB 0 CT2 27 (SUTURE) ×1 IMPLANT
SUT VIC AB 2-0 CT1 TAPERPNT 27 (SUTURE) ×1 IMPLANT
SUTURE FIBERWR #2 38 T-5 BLUE (SUTURE) ×2 IMPLANT
TOWEL GREEN STERILE FF (TOWEL DISPOSABLE) ×1 IMPLANT
TOWEL OR 17X26 10 PK STRL BLUE (TOWEL DISPOSABLE) ×1 IMPLANT

## 2023-09-05 NOTE — Discharge Instructions (Signed)
 Ice to the shoulder constantly.  Keep the incision covered and clean and dry for one week, then ok to get it wet in the shower. You may remove the gel bandage in one week and leave open to air.   Do exercise as instructed several times per day.  DO NOT reach behind your back or push up out of a chair with the operative arm.  Use a sling while you are up and around for comfort, may remove while seated.  Keep pillow propped behind the operative elbow.  Follow up with Dr Brunilda Capra in two weeks in the office, call [phone] for appt  Please call Dr Brunilda Capra (cell) [phone] with any questions or concerns

## 2023-09-05 NOTE — Transfer of Care (Signed)
 Immediate Anesthesia Transfer of Care Note  Patient: Kristina Banks  Procedure(s) Performed: ARTHROPLASTY, SHOULDER, TOTAL, REVERSE (Right: Shoulder)  Patient Location: PACU  Anesthesia Type:General  Level of Consciousness: awake, alert , oriented, and patient cooperative  Airway & Oxygen Therapy: Patient Spontanous Breathing and Patient connected to face mask oxygen  Post-op Assessment: Report given to RN and Post -op Vital signs reviewed and stable  Post vital signs: Reviewed and stable  Last Vitals:  Vitals Value Taken Time  BP 113/59 09/05/23 1351  Temp    Pulse 72 09/05/23 1357  Resp 13 09/05/23 1357  SpO2 97 % 09/05/23 1357  Vitals shown include unfiled device data.  Last Pain:  Vitals:   09/05/23 1005  TempSrc: Oral  PainSc: 0-No pain         Complications: No notable events documented.

## 2023-09-05 NOTE — Anesthesia Procedure Notes (Signed)
 Procedure Name: Intubation Date/Time: 09/05/2023 12:17 PM  Performed by: Mervyn Ace, CRNAPre-anesthesia Checklist: Patient identified, Emergency Drugs available, Suction available, Patient being monitored and Timeout performed Patient Re-evaluated:Patient Re-evaluated prior to induction Oxygen Delivery Method: Circle system utilized Preoxygenation: Pre-oxygenation with 100% oxygen Induction Type: IV induction Ventilation: Mask ventilation without difficulty Laryngoscope Size: Mac and 3 Grade View: Grade I Tube type: Oral Tube size: 7.0 mm Number of attempts: 1 Airway Equipment and Method: Stylet Placement Confirmation: ETT inserted through vocal cords under direct vision, positive ETCO2, CO2 detector and breath sounds checked- equal and bilateral Secured at: 21.5 cm Tube secured with: Tape (secured with pink Hy-tape) Dental Injury: Teeth and Oropharynx as per pre-operative assessment

## 2023-09-05 NOTE — Plan of Care (Signed)
   Problem: Education: Goal: Knowledge of General Education information will improve Description: Including pain rating scale, medication(s)/side effects and non-pharmacologic comfort measures Outcome: Progressing   Problem: Activity: Goal: Risk for activity intolerance will decrease Outcome: Progressing   Problem: Pain Managment: Goal: General experience of comfort will improve and/or be controlled Outcome: Progressing

## 2023-09-05 NOTE — Anesthesia Postprocedure Evaluation (Signed)
 Anesthesia Post Note  Patient: Kristina Banks  Procedure(s) Performed: ARTHROPLASTY, SHOULDER, TOTAL, REVERSE (Right: Shoulder)     Patient location during evaluation: PACU Anesthesia Type: General and Regional Level of consciousness: awake and alert Pain management: pain level controlled Vital Signs Assessment: post-procedure vital signs reviewed and stable Respiratory status: spontaneous breathing, nonlabored ventilation, respiratory function stable and patient connected to nasal cannula oxygen Cardiovascular status: blood pressure returned to baseline and stable Postop Assessment: no apparent nausea or vomiting Anesthetic complications: no  No notable events documented.  Last Vitals:  Vitals:   09/05/23 1400 09/05/23 1415  BP: (!) 124/54 (!) 133/57  Pulse: 67 66  Resp: 11 12  Temp:    SpO2: 96% 97%    Last Pain:  Vitals:   09/05/23 1415  TempSrc:   PainSc: 0-No pain                 Rosalita Combe

## 2023-09-05 NOTE — Anesthesia Procedure Notes (Signed)
 Anesthesia Regional Block: Interscalene brachial plexus block   Pre-Anesthetic Checklist: , timeout performed,  Correct Patient, Correct Site, Correct Laterality,  Correct Procedure, Correct Position, site marked,  Risks and benefits discussed,  Surgical consent,  Pre-op evaluation,  At surgeon's request and post-op pain management  Laterality: Upper and Right  Prep: Maximum Sterile Barrier Precautions used, chloraprep       Needles:  Injection technique: Single-shot  Needle Type: Echogenic Needle     Needle Length: 5cm  Needle Gauge: 21     Additional Needles:   Procedures:,,,, ultrasound used (permanent image in chart),,    Narrative:  Start time: 09/05/2023 11:20 AM End time: 09/05/2023 11:26 AM Injection made incrementally with aspirations every 5 mL.  Performed by: Personally  Anesthesiologist: Rosalita Combe, MD  Additional Notes: Block assessed prior to procedure. Patient tolerated procedure well.

## 2023-09-05 NOTE — Interval H&P Note (Signed)
 History and Physical Interval Note:  09/05/2023 11:56 AM  Kristina Banks  has presented today for surgery, with the diagnosis of Right shoulder massive rotator cuff tear.  The various methods of treatment have been discussed with the patient and family. After consideration of risks, benefits and other options for treatment, the patient has consented to  Procedure(s): ARTHROPLASTY, SHOULDER, TOTAL, REVERSE (Right) as a surgical intervention.  The patient's history has been reviewed, patient examined, no change in status, stable for surgery.  I have reviewed the patient's chart and labs.  Questions were answered to the patient's satisfaction.     Lorriane Rote

## 2023-09-05 NOTE — Brief Op Note (Signed)
 09/05/2023  1:37 PM  PATIENT:  Merritt Ables  83 y.o. female  PRE-OPERATIVE DIAGNOSIS:  Right shoulder massive rotator cuff tear  POST-OPERATIVE DIAGNOSIS:  Right shoulder massive rotator cuff tear  PROCEDURE:  Procedure(s): ARTHROPLASTY, SHOULDER, TOTAL, REVERSE (Right) DePuy Delta Xtend with NO subscap repair  SURGEON:  Surgeons and Role:    Winston Hawking, MD - Primary  PHYSICIAN ASSISTANT:   ASSISTANTS: Corinthia Dickinson, PA-C   ANESTHESIA:   regional and general  EBL:  100 cc  BLOOD ADMINISTERED:none  DRAINS: none   LOCAL MEDICATIONS USED:  MARCAINE      SPECIMEN:  No Specimen  DISPOSITION OF SPECIMEN:  N/A  COUNTS:  YES  TOURNIQUET:  * No tourniquets in log *  DICTATION: .Other Dictation: Dictation Number [id number]  PLAN OF CARE: Admit for overnight observation  PATIENT DISPOSITION:  PACU - hemodynamically stable.   Delay start of Pharmacological VTE agent (>24hrs) due to surgical blood loss or risk of bleeding: not applicable

## 2023-09-05 NOTE — Evaluation (Signed)
 Physical Therapy Evaluation Patient Details Name: Kristina Banks MRN: [id number] DOB: 1941/01/13 Today's Date: 09/05/2023  History of Present Illness  83 yo  female s/p rRTSA on 09/05/23. PMH: arthritis, HTN, TIA, L RCR,  lumbar laminectomy  Clinical Impression  Pt admitted with above diagnosis.  Pt lives alone and is independent at her baseline. Dtr will be assisting at d/c.  Pt able to amb short distance with min assist today; will continue to follow in acute setting to address further gait and stair training.   Pt currently with functional limitations due to the deficits listed below (see PT Problem List). Pt will benefit from acute skilled PT to increase their independence and safety with mobility to allow discharge.           If plan is discharge home, recommend the following: A little help with walking and/or transfers;A little help with bathing/dressing/bathroom;Assistance with cooking/housework;Assist for transportation;Help with stairs or ramp for entrance   Can travel by private vehicle        Equipment Recommendations None recommended by PT  Recommendations for Other Services       Functional Status Assessment Patient has had a recent decline in their functional status and demonstrates the ability to make significant improvements in function in a reasonable and predictable amount of time.     Precautions / Restrictions Precautions Precautions: Shoulder Shoulder Interventions: Shoulder sling/immobilizer Precaution/Restrictions Comments: No ROM R shoulder; ok to use for balance and gentle ADLs Restrictions Weight Bearing Restrictions Per Provider Order: Yes RUE Weight Bearing Per Provider Order: Non weight bearing      Mobility  Bed Mobility Overal bed mobility: Needs Assistance Bed Mobility: Supine to Sit     Supine to sit: Min assist, HOB elevated          Transfers Overall transfer level: Needs assistance Equipment used: 1 person hand held  assist Transfers: Sit to/from Stand Sit to Stand: Min assist, Mod assist           General transfer comment: cues to power up with LEs, incr time, assist to rise and steady    Ambulation/Gait Ambulation/Gait assistance: Min assist Gait Distance (Feet): 15 Feet (x2) Assistive device: 1 person hand held assist Gait Pattern/deviations: Step-through pattern, Wide base of support       General Gait Details: slight drifting, unsteady gait needing min assist throughout distance  Stairs            Wheelchair Mobility     Tilt Bed    Modified Rankin (Stroke Patients Only)       Balance Overall balance assessment: Needs assistance Sitting-balance support: No upper extremity supported Sitting balance-Leahy Scale: Fair     Standing balance support: During functional activity, Single extremity supported Standing balance-Leahy Scale: Poor                               Pertinent Vitals/Pain Pain Assessment Pain Assessment: No/denies pain    Home Living Family/patient expects to be discharged to:: Private residence Living Arrangements: Alone Available Help at Discharge: Family Type of Home: House Home Access: Stairs to enter Entrance Stairs-Rails: Right Entrance Stairs-Number of Steps: 2+2   Home Layout: Able to live on main level with bedroom/bathroom Home Equipment: Agricultural consultant (2 wheels)      Prior Function Prior Level of Function : Independent/Modified Independent  Extremity/Trunk Assessment   Upper Extremity Assessment Upper Extremity Assessment: Defer to OT evaluation    Lower Extremity Assessment Lower Extremity Assessment: Generalized weakness       Communication   Communication Communication: No apparent difficulties    Cognition Arousal: Alert Behavior During Therapy: WFL for tasks assessed/performed, Flat affect   PT - Cognitive impairments: Problem solving                       PT  - Cognition Comments: delayed processing at times, possibly related to anesthesia Following commands: Intact       Cueing Cueing Techniques: Verbal cues, Tactile cues     General Comments      Exercises     Assessment/Plan    PT Assessment Patient needs continued PT services  PT Problem List Decreased strength;Decreased balance;Decreased activity tolerance;Decreased mobility;Decreased safety awareness       PT Treatment Interventions Therapeutic activities;Gait training;Functional mobility training;Therapeutic exercise;Patient/family education;Stair training;Balance training    PT Goals (Current goals can be found in the Care Plan section)  Acute Rehab PT Goals PT Goal Formulation: All assessment and education complete, DC therapy Time For Goal Achievement: 09/12/23 Potential to Achieve Goals: Good    Frequency Min 3X/week     Co-evaluation               AM-PAC PT "6 Clicks" Mobility  Outcome Measure Help needed turning from your back to your side while in a flat bed without using bedrails?: A Little Help needed moving from lying on your back to sitting on the side of a flat bed without using bedrails?: A Little Help needed moving to and from a bed to a chair (including a wheelchair)?: A Little Help needed standing up from a chair using your arms (e.g., wheelchair or bedside chair)?: A Little Help needed to walk in hospital room?: A Little Help needed climbing 3-5 steps with a railing? : A Lot 6 Click Score: 17    End of Session Equipment Utilized During Treatment: Gait belt Activity Tolerance: Patient tolerated treatment well Patient left: in chair;with chair alarm set;with call bell/phone within reach;with family/visitor present   PT Visit Diagnosis: Other abnormalities of gait and mobility (R26.89)    Time: 1640-1710 PT Time Calculation (min) (ACUTE ONLY): 30 min   Charges:   PT Evaluation $PT Eval Low Complexity: 1 Low PT Treatments $Gait Training:  8-22 mins PT General Charges $$ ACUTE PT VISIT: 1 Visit         Varonica Siharath, PT  Acute Rehab Dept [facility]) [phone]  09/05/2023   [facility] 09/05/2023, 5:21 PM

## 2023-09-06 ENCOUNTER — Encounter (HOSPITAL_COMMUNITY): Payer: Self-pay | Admitting: Orthopedic Surgery

## 2023-09-06 DIAGNOSIS — M75101 Unspecified rotator cuff tear or rupture of right shoulder, not specified as traumatic: Secondary | ICD-10-CM | POA: Diagnosis not present

## 2023-09-06 LAB — BASIC METABOLIC PANEL WITH GFR
Anion gap: 7 (ref 5–15)
BUN: 16 mg/dL (ref 8–23)
CO2: 21 mmol/L — ABNORMAL LOW (ref 22–32)
Calcium: 8.2 mg/dL — ABNORMAL LOW (ref 8.9–10.3)
Chloride: 105 mmol/L (ref 98–111)
Creatinine, Ser: 0.8 mg/dL (ref 0.44–1.00)
GFR, Estimated: >60 mL/min (ref >60)
Glucose, Bld: 174 mg/dL — ABNORMAL HIGH (ref 70–99)
Potassium: 3.9 mmol/L (ref 3.5–5.1)
Sodium: 133 mmol/L — ABNORMAL LOW (ref 135–145)

## 2023-09-06 LAB — HEMOGLOBIN AND HEMATOCRIT, BLOOD
HCT: 34.7 % — ABNORMAL LOW (ref 36.0–46.0)
Hemoglobin: 11.2 g/dL — ABNORMAL LOW (ref 12.0–15.0)

## 2023-09-06 MED ORDER — ONDANSETRON HCL 4 MG PO TABS: 4.0000 mg | ORAL_TABLET | Freq: Three times a day (TID) | ORAL | 1 refills | Status: AC | PRN

## 2023-09-06 NOTE — Progress Notes (Signed)
 Physical Therapy Treatment Patient Details Name: Kristina Banks MRN: [id number] DOB: [birth year] Today's Date: 09/06/2023   History of Present Illness 83 yo  female s/p rRTSA on 09/05/23. PMH: arthritis, HTN, TIA, L RCR,  lumbar laminectomy    PT Comments  Pt motivated to d/c home today. Reviewed gait/safety and stairs with pt after OT session. Pt continues to exhibit mildly unsteady gait requiring intermittent assist, although no overt LOB during session.  Reviewed techniques for ascending/descending stairs and pt able to return demo with CGA to close supervision. Pt is ready to d/c from PT standpoint with dtr assisting prn.    If plan is discharge home, recommend the following: A little help with walking and/or transfers;A little help with bathing/dressing/bathroom;Assistance with cooking/housework;Assist for transportation;Help with stairs or ramp for entrance   Can travel by private vehicle        Equipment Recommendations  None recommended by PT    Recommendations for Other Services       Precautions / Restrictions Precautions Precautions: Shoulder Shoulder Interventions: Shoulder sling/immobilizer Recall of Precautions/Restrictions: Intact Precaution/Restrictions Comments: No ROM R shoulder; ok to use for balance and gentle ADLs Restrictions RUE Weight Bearing Per Provider Order: Non weight bearing     Mobility  Bed Mobility               General bed mobility comments: sitting EOB    Transfers Overall transfer level: Needs assistance Equipment used: None Transfers: Sit to/from Stand Sit to Stand: Min assist           General transfer comment: cues to power up with LEs, incr time, assist to rise and steady    Ambulation/Gait Ambulation/Gait assistance: Min assist, Contact guard assist Gait Distance (Feet): 180 Feet Assistive device: 1 person hand held assist, None Gait Pattern/deviations: Step-through pattern, Decreased stride length. Bil knees flexed         General Gait Details: unsteady gait with light min assist to CGA to steady. no overt LOB   Stairs Stairs: Yes Stairs assistance: Min assist, Contact guard assist Stair Management: One rail Left, One rail Right, Step to pattern, Alternating pattern, Sideways Number of Stairs: 4 General stair comments: reviewed up/down steps with R hand rail sideways, step to  pattern and L hand rail reciprocal pattern. CGA for safety/steadying assist. verbally reinforced/reviewed techniques with pt and dtr as well   Wheelchair Mobility     Tilt Bed    Modified Rankin (Stroke Patients Only)       Balance   Sitting-balance support: No upper extremity supported, Feet supported Sitting balance-Leahy Scale: Fair     Standing balance support: During functional activity, Single extremity supported, No upper extremity supported Standing balance-Leahy Scale: Fair                              Hotel manager: No apparent difficulties  Cognition Arousal: Alert Behavior During Therapy: WFL for tasks assessed/performed, Flat affect   PT - Cognitive impairments: Problem solving                         Following commands: Intact      Cueing Cueing Techniques: Verbal cues  Exercises      General Comments        Pertinent Vitals/Pain Pain Assessment Pain Assessment: No/denies pain    Home Living  Prior Function            PT Goals (current goals can now be found in the care plan section) Acute Rehab PT Goals PT Goal Formulation: All assessment and education complete, DC therapy Time For Goal Achievement: 09/12/23 Potential to Achieve Goals: Good Progress towards PT goals: Progressing toward goals    Frequency    Min 3X/week      PT Plan      Co-evaluation              AM-PAC PT "6 Clicks" Mobility   Outcome Measure  Help needed turning from your back to your side while in a  flat bed without using bedrails?: A Little Help needed moving from lying on your back to sitting on the side of a flat bed without using bedrails?: A Little Help needed moving to and from a bed to a chair (including a wheelchair)?: A Little Help needed standing up from a chair using your arms (e.g., wheelchair or bedside chair)?: A Little Help needed to walk in hospital room?: A Little Help needed climbing 3-5 steps with a railing? : A Little 6 Click Score: 18    End of Session Equipment Utilized During Treatment: Gait belt Activity Tolerance: Patient tolerated treatment well Patient left: in bed;with call bell/phone within reach;with family/visitor present   PT Visit Diagnosis: Other abnormalities of gait and mobility (R26.89)     Time: 1610-9604 PT Time Calculation (min) (ACUTE ONLY): 22 min  Charges:    $Gait Training: 8-22 mins PT General Charges $$ ACUTE PT VISIT: 1 Visit                     Coe Angelos, PT  Acute Rehab Dept [facility]) [phone]  09/06/2023    [facility] 09/06/2023, 11:49 AM

## 2023-09-06 NOTE — Progress Notes (Signed)
   09/06/23 1030  TOC Brief Assessment  Insurance and Status Reviewed  Patient has primary care physician Yes  Home environment has been reviewed home alone  Prior level of function: independent  Prior/Current Home Services No current home services  Social Drivers of Health Review SDOH reviewed no interventions necessary  Readmission risk has been reviewed Yes  Transition of care needs no transition of care needs at this time

## 2023-09-06 NOTE — Op Note (Signed)
 Kristina Banks, CRONK MEDICAL RECORD NO: [id number] ACCOUNT NO: [id number] DATE OF BIRTH: 1940-07-20 FACILITY: [facility] LOCATION: [id number] PHYSICIAN: Loreta Rome. Brunilda Capra, MD  Operative Report   DATE OF PROCEDURE: 09/05/2023  PREOPERATIVE DIAGNOSIS:  Right shoulder massive rotator cuff tear.  POSTOPERATIVE DIAGNOSIS:  Right shoulder massive rotator cuff tear.  PROCEDURE PERFORMED:  Right reverse total shoulder arthroplasty using the DePuy Delta Xtend prosthesis with no subscapularis repair.  ATTENDING SURGEON:  Loreta Rome. Brunilda Capra, MD.  ASSISTANT:  Barton Like Dixon, [place], who was scrubbed during the entire procedure, and necessary for satisfactory completion of surgery.  ANESTHESIA:  General anesthesia was used plus interscalene block.  ESTIMATED BLOOD LOSS:  100 mL.  FLUID REPLACEMENT:  1000 mL crystalloid.  COUNTS:  Instrument count was correct.  COMPLICATIONS:   There were no complications.  ANTIBIOTICS:  Perioperative antibiotics were given.  INDICATIONS:  The patient is an 83 year old female who presents with a history of worsening right shoulder pain due to a massive rotator cuff tear and early rotator cuff tear arthropathy.  The patient has superior head migration on x-rays and declining  function interfering with mobility and independence.  We discussed options for management and recommended reverse total shoulder arthroplasty. This will restore fixed fulcrum mechanics to the shoulder, which will provide pain relief and also improve  function. Risks including, but not limited to, infection and instability were discussed with the patient.  Informed consent obtained.  DESCRIPTION OF PROCEDURE:  After an adequate level of general anesthesia was achieved plus interscalene block, the patient was positioned in the modified beach chair position.  The right shoulder was correctly identified, and a sterile prep and drape  were performed. Timeout called verifying correct patient and correct  site. We entered the patient's shoulder using a standard deltopectoral approach starting at the coracoid process and extending down the anterior humerus.  Dissection down through the  subcutaneous tissues using Bovie, cephalic vein was identified and retracted laterally. The deltoid pectoralis was taken medially. Conjoined tendon was identified and retracted medially.  Deep retractors were placed.  We then tenodesed the biceps in situ  with 0 Vicryl figure-of-eight suture x 2. We next released the subscapularis off the lesser tuberosity.  This was fairly stiff and looked a little bit degenerative and probably not repairable in this 83 year old.  We placed sutures to tag and retract  using the subscapularis to protect the axillary nerve.  We then released the inferior capsule progressively, externally rotating and extending the humerus and bringing the humerus out of the wound.  We entered the proximal humerus, which was  significantly devoid of cartilage with a 6-mm reamer, reamed up to a size 10. We then placed our 10-mm T-handle guide and resected the head 20 degrees of retroversion with the oscillating saw.  We irrigated thoroughly. We removed excess osteophytes with  a rongeur.  Next, we subluxed the humerus posteriorly, gaining good exposure of the glenoid face.  We removed the capsule and the labrum exposing the glenoid.  We placed our deep glenoid retractors. Next, we removed the cartilage with a Cobb elevator and  rongeur.  Next, we found our center point for our guide pin and placed that bicortically, centered low on the glenoid.  We then reamed the subchondral bone in preparation for the metaglene baseplate.  Next, we did our peripheral hand reaming with the  T-handle reamer. We then went ahead and drilled out our central peg hole.  We irrigated thoroughly. We then impacted the HA-coated  press-fit baseplate into position. We placed a 42 screw inferiorly, a 36 screw superiorly, locking both the  screws.  The  baseplate was secured.  We then selected a 38 +0 standard glenosphere and attached that to the baseplate with the screwdriver. I did a finger sweep to make sure we had no soft tissue cut between the baseplate and the glenosphere.  Next, we went to the  humeral side and reamed for the one right metaphysis. Once we had that done, we trialed with a 10 stem and the one right metaphysis set in the 0 setting and placed in 20 degrees of retroversion.  We trialed with a 38 +3 poly trial and placed that in the  humeral tray and reduced the shoulder.  We had excellent soft tissue stability and balancing, no impingement throughout a full range of motion. We irrigated thoroughly.  We then went ahead and removed the trials from the humeral side.  We used available  bone graft from the humeral head and with impaction grafting technique, impacted the HA-coated press-fit 10 stem with the one right metaphysis set in the 0 setting and impacted in 20 degrees of retroversion.  With the stem stable, we selected the real 38  +3 poly, placed on the humeral tray, impacted that and reduced the shoulder.  The shoulder was very stable throughout a full arc of motion. Conjoined had appropriately tensioned.  We then went ahead and externally rotated, no gapping with inferior pole.   Everything moved together.  We irrigated again and then resected the subscapularis remnant.  We then went ahead and repaired deltopectoral interval with 0 Vicryl suture followed by 2-0 Vicryl for subcutaneous closure and 4-0 Monocryl for skin.  Steri-Strips were applied followed by a sterile dressing.  The patient tolerated the surgery well.    NIK D: 09/05/2023 1:43:14 pm T: 09/06/2023 2:51:00 am  JOB: [id number]/ [id number]

## 2023-09-06 NOTE — Evaluation (Signed)
 Occupational Therapy Evaluation Patient Details Name: Kristina Banks MRN: [id number] DOB: [birth year] Today's Date: 09/06/2023   History of Present Illness   Kristina Banks is a 83 yr old female who is a R reverse total shoulder arthroplasty 09-05-23, due to a massive rotator cuff tear.     Clinical Impressions Pt is s/p shoulder replacement of right dominant upper extremity on 09-05-23. Therapist provided education and instruction to patient and her daughter with regards to ROM/exercises, post-op precautions, UE and sling positioning, correctly donning upper extremity clothing, recommendations for bathing while maintaining shoulder precautions, use of ice for pain and  edema management, NWB status, sling wear schedule and donning/doffing sling. Patient presented with fair recall and understanding, and her daughter presented with good recall and understanding.Patient needed assistance to donn shirt, pants, socks shoes, with instruction on compensatory strategies to perform ADLs. OT to follow the pt in the acute care setting, to maximize her independence with self-care tasks and to reinforce the aforementioned education provided. Patient to follow up with MD for further therapy needs.       If plan is discharge home, recommend the following:   A little help with bathing/dressing/bathroom;Help with stairs or ramp for entrance;Assist for transportation;Assistance with cooking/housework     Functional Status Assessment   Patient has had a recent decline in their functional status and demonstrates the ability to make significant improvements in function in a reasonable and predictable amount of time.     Equipment Recommendations   Tub/shower seat     Recommendations for Other Services         Precautions/Restrictions   Precautions Precautions: Shoulder Shoulder Interventions: Shoulder sling/immobilizer Precaution Booklet Issued: Yes (comment) Precaution/Restrictions Comments: No  ROM R shoulder; ok to use for balance and gentle ADLs Restrictions RUE Weight Bearing Per Provider Order: Non weight bearing Other Position/Activity Restrictions: okay to perform elbow, wrist, and hand ROM, sling to be worn at all times except ADLs/exercise     Mobility     Transfers Overall transfer level: Needs assistance Equipment used: None Transfers: Sit to/from Stand Sit to Stand: Contact guard assist                  Balance     Sitting balance-Leahy Scale: Good             ADL either performed or assessed with clinical judgement       Pertinent Vitals/Pain Pain Assessment Pain Assessment: 0-10 Pain Score: 3  Pain Location: R shoulder Pain Intervention(s): Monitored during session     Extremity/Trunk Assessment Upper Extremity Assessment Upper Extremity Assessment: Right hand dominant           Communication Communication Communication: No apparent difficulties   Cognition Arousal: Alert Behavior During Therapy: WFL for tasks assessed/performed, Flat affect          Following commands: Intact             Shoulder Instructions Shoulder Instructions Donning/doffing shirt without moving shoulder: Minimal assistance (Pt's daughter assisting with task, with OT providing cues and instruction as needed on proper technique/sequencing) Method for sponge bathing under operated UE:  (Caregiver verbalized understanding) Donning/doffing sling/immobilizer: Moderate assistance Correct positioning of sling/immobilizer:  (Pt's daughter assisting with task, with OT providing cues and instruction as needed on proper technique/sequencing) Pendulum exercises (written home exercise program):  (N/A) ROM for elbow, wrist and digits of operated UE:  (Caregiver verbalized understanding) Sling wearing schedule (on at all times/off for ADL's):  (Caregiver verbalized  understanding) Proper positioning of operated UE when showering:  (Caregiver verbalized  understanding) Dressing change:  (N/A) Positioning of UE while sleeping:  (Caregiver verbalized understanding)    Home Living Family/patient expects to be discharged to:: Private residence Living Arrangements: Alone Available Help at Discharge: Family Type of Home: House   Entrance Stairs-Number of Steps: 4 Entrance Stairs-Rails: Right Home Layout: Able to live on main level with bedroom/bathroom;Two level     Bathroom Shower/Tub: Walk-in shower         Home Equipment: Agricultural consultant (2 wheels);Cane - single point          Prior Functioning/Environment Prior Level of Function : Independent/Modified Independent;Driving             Mobility Comments: Independent with ambulation. ADLs Comments: Independent with ADLS, cooking, cleaning, and driving.    OT Problem List: Impaired UE functional use;Pain;Decreased range of motion;Decreased strength;Decreased knowledge of precautions   OT Treatment/Interventions: Self-care/ADL training;Therapeutic exercise;Energy conservation;Patient/family education;Balance training;Therapeutic activities;DME and/or AE instruction      OT Goals(Current goals can be found in the care plan section)   Acute Rehab OT Goals OT Goal Formulation: With patient Time For Goal Achievement: 09/20/23 Potential to Achieve Goals: Good ADL Goals Pt Will Perform Upper Body Dressing: with caregiver independent in assisting;sitting Pt Will Perform Lower Body Dressing: with caregiver independent in assisting;Independently;sit to/from stand;sitting/lateral leans Pt Will Transfer to Toilet: with modified independence;ambulating Pt Will Perform Toileting - Clothing Manipulation and hygiene: with modified independence;sit to/from stand   OT Frequency:  Min 2X/week       AM-PAC OT "6 Clicks" Daily Activity     Outcome Measure Help from another person eating meals?: None Help from another person taking care of personal grooming?: A Little Help from  another person toileting, which includes using toliet, bedpan, or urinal?: A Little Help from another person bathing (including washing, rinsing, drying)?: A Lot Help from another person to put on and taking off regular upper body clothing?: A Lot Help from another person to put on and taking off regular lower body clothing?: A Little 6 Click Score: 17   End of Session Equipment Utilized During Treatment: Other (comment) (N/A) Nurse Communication: Other (comment) (Shoulder education completed)  Activity Tolerance: Patient tolerated treatment well Patient left: in bed;with family/visitor present  OT Visit Diagnosis: Muscle weakness (generalized) (M62.81);Pain                Time: 1001-1029 OT Time Calculation (min): 28 min Charges:  OT General Charges $OT Visit: 1 Visit OT Evaluation $OT Eval Moderate Complexity: 1 Mod OT Treatments $Self Care/Home Management : 8-22 mins    Sheralyn Dies, OTR/L 09/06/2023, 12:42 PM

## 2023-09-06 NOTE — Discharge Summary (Signed)
 In most cases prophylactic antibiotics for Dental procdeures after total joint surgery are not necessary.  Exceptions are as follows:  1. History of prior total joint infection  2. Severely immunocompromised (Organ Transplant, cancer chemotherapy, Rheumatoid biologic meds such as Humera)  3. Poorly controlled diabetes (A1C &gt; 8.0, blood glucose over 200)  If you have one of these conditions, contact your surgeon for an antibiotic prescription, prior to your dental procedure. Orthopedic Discharge Summary        Physician Discharge Summary  Patient ID: Kristina Banks MRN: [id number] DOB/AGE: September 02, 1940 83 y.o.  Admit date: 09/05/2023 Discharge date: 09/06/2023   Procedures:  Procedure(s) (LRB): ARTHROPLASTY, SHOULDER, TOTAL, REVERSE (Right)  Attending Physician:  Dr. 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 lat. buttock, left lat. hip. 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 with superficial infiltration   Stroke [facility]) 2006   tia   TIA (transient ischemic attack)     PCP: Sari Cunning, MD   Discharged Condition: good  Hospital Course:  Patient underwent the above stated procedure on 09/05/2023. Patient tolerated the procedure  well and brought to the recovery room in good condition and subsequently to the floor. Patient had an uncomplicated hospital course and was stable for discharge.   Disposition: Discharge disposition: 01-Home or Self Care      with follow up in 2 weeks    Follow-up Information     Winston Hawking, MD. Call in 2 week(s).   Specialty: Orthopedic Surgery Why: please call [phone] for appt in two weeks in the office Contact information: [address] [place] [address] [phone]                 Dental Antibiotics:  In most cases prophylactic antibiotics for Dental procdeures after total joint surgery are not necessary.  Exceptions are as follows:  1. History of prior total joint infection  2. Severely immunocompromised (Organ Transplant, cancer chemotherapy, Rheumatoid biologic meds such as Humera)  3. Poorly controlled diabetes (A1C &gt; 8.0, blood glucose over 200)  If you have one of these conditions, contact your surgeon for an antibiotic prescription, prior to your dental procedure.  Discharge Instructions     Call MD / Call 911   Complete by: As directed    If you experience chest pain or shortness of breath, CALL 911 and be transported to the hospital emergency room.  If you develope a fever above 101 F, pus (white drainage) or increased drainage or redness at the wound, or calf pain, call your surgeon's office.  Constipation Prevention   Complete by: As directed    Drink plenty of fluids.  Prune juice may be helpful.  You may use a stool softener, such as Colace (over the counter) 100 mg twice a day.  Use MiraLax  (over the counter) for constipation as needed.   Diet - low sodium heart healthy   Complete by: As directed    Increase activity slowly as tolerated   Complete by: As directed    Post-operative opioid taper instructions:   Complete by: As directed    POST-OPERATIVE OPIOID TAPER INSTRUCTIONS: It is important to wean off  of your opioid medication as soon as possible. If you do not need pain medication after your surgery it is ok to stop day one. Opioids include: Codeine, Hydrocodone (Norco, Vicodin), Oxycodone (Percocet, oxycontin ) and hydromorphone  amongst others.  Long term and even short term use of opiods can cause: Increased pain response Dependence Constipation Depression Respiratory depression And more.  Withdrawal symptoms can include Flu like symptoms Nausea, vomiting And more Techniques to manage these symptoms Hydrate well Eat regular healthy meals Stay active Use relaxation techniques(deep breathing, meditating, yoga) Do Not substitute Alcohol to help with tapering If you have been on opioids for less than two weeks and do not have pain than it is ok to stop all together.  Plan to wean off of opioids This plan should start within one week post op of your joint replacement. Maintain the same interval or time between taking each dose and first decrease the dose.  Cut the total daily intake of opioids by one tablet each day Next start to increase the time between doses. The last dose that should be eliminated is the evening dose.          Allergies as of 09/06/2023       Reactions   Codeine Nausea And Vomiting, Other (See Comments)   Hallucinations   Hydrocodone     Other reaction(s): Not available   Other    Tramadol  Other (See Comments)   Agitation   Cephalexin  Other (See Comments)   Dizziness, PATIENT DENIES   Oxycodone  Other (See Comments)   Dizzy        Medication List     STOP taking these medications    doxycycline  100 MG tablet Commonly known as: VIBRA -TABS       TAKE these medications    acetaminophen  500 MG tablet Commonly known as: TYLENOL  Take 2 tablets (1,000 mg total) by mouth every 8 (eight) hours as needed for moderate pain (pain score 4-6).   amLODipine  5 MG tablet Commonly known as: NORVASC  Take 5 mg by mouth daily.   aspirin  325 MG  tablet Take 325 mg by mouth daily.   Biotin  10 MG Tabs Take 10 mg by mouth daily.   CALCIUM  600 + D PO Take 1 tablet by mouth daily.   chlorhexidine  4 % external liquid Commonly known as: HIBICLENS  Apply 15 mLs (1 Application total) topically as directed for 30 doses. Use as directed daily for 5 days every other week for 6 weeks.   donepezil  10 MG tablet Commonly known as: ARICEPT  Take 10 mg by mouth at bedtime.   meloxicam  15 MG tablet Commonly known as: MOBIC  Take 1 tablet (15 mg total) by mouth daily.   mupirocin  ointment 2 % Commonly known as: BACTROBAN  Apply to bites QD What changed: Another medication with the same name was added. Make sure you understand how and when to take each.   mupirocin  ointment 2 %  Commonly known as: BACTROBAN  Place 1 Application into the nose 2 (two) times daily for 60 doses. Use as directed 2 times daily for 5 days every other week for 6 weeks. What changed: You were already taking a medication with the same name, and this prescription was added. Make sure you understand how and when to take each.   olmesartan 20 MG tablet Commonly known as: BENICAR Take 20 mg by mouth daily.   ondansetron  4 MG tablet Commonly known as: Zofran  Take 1 tablet (4 mg total) by mouth every 8 (eight) hours as needed for vomiting, refractory nausea / vomiting or nausea.   rosuvastatin  10 MG tablet Commonly known as: CRESTOR  Take 1 tablet (10 mg total) by mouth daily at 6 PM.   traMADol  50 MG tablet Commonly known as: Ultram  Take 1 tablet (50 mg total) by mouth every 6 (six) hours as needed for severe pain (pain score 7-10) (for breakthrough pain, take with food).   Vitamin D  50 MCG (2000 UT) Caps Take 2,000 Units by mouth daily.          Signed: Lorriane Rote 09/06/2023, 7:58 AM  [facility] is now [facility]  [place] [address], [address], [place], [place] [address] Phone: [phone] Facebook  Instagram  [employer]

## 2023-09-06 NOTE — Progress Notes (Signed)
 Orthopedics Progress Note  Subjective: Patient feeling well this AM. No pain, block still working  Objective:  Vitals:   09/06/23 0110 09/06/23 0454  BP: 122/66 (!) 126/59  Pulse: 69 64  Resp: 18 18  Temp: 97.9 F (36.6 C) 97.8 F (36.6 C)  SpO2: 95% 91%    General: Awake and alert  Musculoskeletal: Right shoulder dressing changed. Wound looks good. Minimal swelling and bruising Neurovascularly intact  Lab Results  Component Value Date   WBC 8.7 08/24/2023   HGB 11.2 (L) 09/06/2023   HCT 34.7 (L) 09/06/2023   MCV 94.4 08/24/2023   PLT 247 08/24/2023       Component Value Date/Time   NA 133 (L) 09/06/2023 0344   K 3.9 09/06/2023 0344   CL 105 09/06/2023 0344   CO2 21 (L) 09/06/2023 0344   GLUCOSE 174 (H) 09/06/2023 0344   BUN 16 09/06/2023 0344   CREATININE 0.80 09/06/2023 0344   CALCIUM  8.2 (L) 09/06/2023 0344   GFRNONAA >60 09/06/2023 0344   GFRAA >60 01/27/2019 1444    Lab Results  Component Value Date   INR 0.9 01/27/2019    Assessment/Plan: POD #1 s/p Procedure(s): ARTHROPLASTY, SHOULDER, TOTAL, REVERSE Stable after Right R-TSA Home today after therapy Follow up in two weeks  Loreta Rome. Brunilda Capra, MD 09/06/2023 7:56 AM

## 2023-10-02 ENCOUNTER — Encounter (INDEPENDENT_AMBULATORY_CARE_PROVIDER_SITE_OTHER): Payer: Self-pay

## 2023-11-19 ENCOUNTER — Encounter: Payer: Self-pay | Admitting: Podiatry

## 2023-11-19 ENCOUNTER — Other Ambulatory Visit: Payer: Self-pay

## 2023-11-19 ENCOUNTER — Ambulatory Visit (INDEPENDENT_AMBULATORY_CARE_PROVIDER_SITE_OTHER): Admitting: Podiatry

## 2023-11-19 ENCOUNTER — Ambulatory Visit (INDEPENDENT_AMBULATORY_CARE_PROVIDER_SITE_OTHER)

## 2023-11-19 VITALS — Ht 66.0 in | Wt 125.0 lb

## 2023-11-19 DIAGNOSIS — G5791 Unspecified mononeuropathy of right lower limb: Secondary | ICD-10-CM | POA: Diagnosis not present

## 2023-11-19 DIAGNOSIS — M25571 Pain in right ankle and joints of right foot: Secondary | ICD-10-CM

## 2023-11-19 DIAGNOSIS — S93601A Unspecified sprain of right foot, initial encounter: Secondary | ICD-10-CM | POA: Diagnosis not present

## 2023-11-19 NOTE — Progress Notes (Signed)
  Subjective:  Patient ID: Kristina Banks, female    DOB: 05/06/1941,  MRN: [id number]  Chief Complaint  Patient presents with   Foot Injury    Pt is here due to right foot pain she states over 4-5 weeks ago was hanging something in garage climb a ladder to help and sprain her foot while doing so, states she did not fall or trip, states a throbbing pain in the foot and hurts to walk on.    Discussed the use of AI scribe software for clinical note transcription with the patient, who gave verbal consent to proceed.  History of Present Illness Kristina Banks is an 83 year old female who presents with foot pain following a recent injury.  Approximately four weeks ago, she sustained an injury to her foot while attempting to hang mirrors in her garage. She did not fall but has experienced persistent pain since the incident. The pain is localized to the foot and radiates down to the toe when pressure is applied, particularly when bending the foot. It is exacerbated by inversion, eversion, and resisted plantar flexion and dorsiflexion, making walking difficult at times.  She has a history of shoulder surgery, which occurred prior to the foot injury, and is currently in the fourth week of recovery. She is expected to remain in a splint until the 26th of the month.  She lives alone following the death of her husband.      Objective:    Physical Exam VASCULAR: DP and PT pulse palpable. Foot is warm and well-perfused. Capillary fill time is brisk. DERMATOLOGIC: Normal skin turgor, texture, and temperature. No open lesions, rashes, or ulcerations. NEUROLOGIC: Normal sensation to light touch and pressure. No paresthesias on examination. ORTHOPEDIC: Pain over sinus tarsi. No pain over ATFL or CFL. Pain worse with inversion, eversion, and resisted plantar and dorsal flexion. Smooth pain-free range of motion of all other examined joints. No ecchymosis, bruising, or gross deformity.   No images are  attached to the encounter.    Results RADIOLOGY Foot X-ray: No significant abnormalities detected, midfoot and mild subtalar arthritis   Assessment:   1. Sprain of foot, right, initial encounter   2. Sinus tarsi syndrome of right ankle      Plan:  Patient was evaluated and treated and all questions answered.  Assessment and Plan Assessment & Plan Sinus tarsitis and subtalar arthritis, right foot subacute sprain  Sinus tarsitis and pre-existing subtalar arthritis acutely aggravated by recent injury. Pain localized to the subtalar joint, exacerbated by inversion, eversion, and resisted plantar flexion and dorsal flexion. No pain over the ATFL or CFL. Significant discomfort impacting mobility, persisting for approximately four weeks. Joint inflammation likely due to a sprain from a recent ladder incident. - Administer corticosteroid injection to the subtalar joint to reduce inflammation and alleviate pain. - Discuss RICE protocol for managing inflammation. - Consider a brace, but usage may be difficult due to shoulder injury. - If symptoms do not improve within two to three weeks, consider MRI for further evaluation.  Shoulder surgery recovery Currently in the fourth week of recovery from shoulder surgery. Mobility is limited due to the use of a splint, expected to be in place until the 26th. The shoulder condition impacts her ability to use a brace for her foot condition.      Return if symptoms worsen or fail to improve.

## 2023-11-19 NOTE — Patient Instructions (Signed)
  VISIT SUMMARY: Today, you were seen for foot pain following a recent injury while hanging mirrors in your garage. You have been experiencing persistent pain in your foot for about four weeks, which is affecting your mobility. Additionally, you are in the fourth week of recovery from shoulder surgery and are currently using a splint.  YOUR PLAN: -SINUS TARSITIS AND SUBTALAR ARTHRITIS: Sinus tarsitis is inflammation of the sinus tarsi, a small cavity on the outside of the ankle, and subtalar arthritis is arthritis in the joint below the ankle. Your recent injury has aggravated these conditions, causing significant pain and difficulty with movement. We administered a corticosteroid injection to reduce inflammation and pain. We also discussed the RICE protocol (Rest, Ice, Compression, Elevation) to help manage the inflammation. Using a brace might be challenging due to your shoulder injury, but it is an option to consider. If your symptoms do not improve within two to three weeks, we may need to do an MRI for further evaluation.  -SHOULDER SURGERY RECOVERY: You are currently in the fourth week of recovery from shoulder surgery and are using a splint, which limits your mobility. The splint is expected to remain in place until the 26th of this month. This limitation may affect your ability to use a brace for your foot condition.  INSTRUCTIONS: If your foot pain does not improve within two to three weeks, please schedule an MRI for further evaluation. Continue to follow the RICE protocol and monitor your symptoms. Your shoulder splint should remain in place until the 26th of this month as planned.                      Contains text generated by Abridge.                                 Contains text generated by Abridge.

## 2023-11-26 ENCOUNTER — Ambulatory Visit: Payer: PRIVATE HEALTH INSURANCE | Admitting: Dermatology

## 2023-11-26 ENCOUNTER — Telehealth: Payer: Self-pay

## 2023-11-26 NOTE — Progress Notes (Unsigned)
 Kristina Banks

## 2023-11-26 NOTE — Telephone Encounter (Signed)
 Patient was scheduled for office visit today with a place of concern. Patient stated she no longer needed appointment and was unsure of any spots of concern. She would like to keep her scheduled appointment in October 2025 for a yearly skin check.

## 2024-01-15 NOTE — Telephone Encounter (Signed)
 Opened in error . disregard

## 2024-01-22 ENCOUNTER — Ambulatory Visit

## 2024-01-22 DIAGNOSIS — M79601 Pain in right arm: Secondary | ICD-10-CM

## 2024-01-22 DIAGNOSIS — S41111A Laceration without foreign body of right upper arm, initial encounter: Secondary | ICD-10-CM

## 2024-01-22 DIAGNOSIS — D692 Other nonthrombocytopenic purpura: Secondary | ICD-10-CM

## 2024-01-22 DIAGNOSIS — S51011A Laceration without foreign body of right elbow, initial encounter: Secondary | ICD-10-CM | POA: Diagnosis not present

## 2024-01-22 DIAGNOSIS — R296 Repeated falls: Secondary | ICD-10-CM | POA: Diagnosis not present

## 2024-01-22 MED ORDER — MUPIROCIN 2 % EX OINT: TOPICAL_OINTMENT | CUTANEOUS | 1 refills | Status: AC

## 2024-01-22 NOTE — Patient Instructions (Addendum)
 Wound Care Instructions  Cleanse wound gently with soap and water  once a day then pat dry with clean gauze. Apply a thin coat of Petrolatum (petroleum jelly, Vaseline) over the wound (unless you have an allergy to this). We recommend that you use a new, sterile tube of Vaseline. Do not pick or remove scabs. Do not remove the yellow or white healing tissue from the base of the wound.  Cover the wound with fresh, clean, nonstick gauze and secure with paper tape. You may use Band-Aids in place of gauze and tape if the wound is small enough, but would recommend trimming much of the tape off as there is often too much. Sometimes Band-Aids can irritate the skin.  If you experience any problems, such as abnormal amounts of bleeding, swelling, significant bruising, significant pain, or evidence of infection, please call the office immediately.       Due to recent changes in healthcare laws, you may see results of your pathology and/or laboratory studies on MyChart before the doctors have had a chance to review them. We understand that in some cases there may be results that are confusing or concerning to you. Please understand that not all results are received at the same time and often the doctors may need to interpret multiple results in order to provide you with the best plan of care or course of treatment. Therefore, we ask that you please give us  2 business days to thoroughly review all your results before contacting the office for clarification. Should we see a critical lab result, you will be contacted sooner.   If You Need Anything After Your Visit  If you have any questions or concerns for your doctor, please call our main line at [phone] and press option 4 to reach your doctor's medical assistant. If no one answers, please leave a voicemail as directed and we will return your call as soon as possible. Messages left after 4 pm will be answered the following business day.   You may also  send us  a message via MyChart. We typically respond to MyChart messages within 1-2 business days.  For prescription refills, please ask your pharmacy to contact our office. Our fax number is [phone].  If you have an urgent issue when the clinic is closed that cannot wait until the next business day, you can page your doctor at the number below.    Please note that while we do our best to be available for urgent issues outside of office hours, we are not available 24/7.   If you have an urgent issue and are unable to reach us , you may choose to seek medical care at your doctor's office, retail clinic, urgent care center, or emergency room.  If you have a medical emergency, please immediately call 911 or go to the emergency department.  Pager Numbers  - Dr. Hester: [phone]  - Dr. Jackquline: [phone]  - Dr. Claudene: [phone]   - Dr. Raymund: [phone]  In the event of inclement weather, please call our main line at [phone] for an update on the status of any delays or closures.  Dermatology Medication Tips: Please keep the boxes that topical medications come in in order to help keep track of the instructions about where and how to use these. Pharmacies typically print the medication instructions only on the boxes and not directly on the medication tubes.   If your medication is too expensive, please contact our office at [phone] option 4 or send us  a  message through MyChart.   We are unable to tell what your co-pay for medications will be in advance as this is different depending on your insurance coverage. However, we may be able to find a substitute medication at lower cost or fill out paperwork to get insurance to cover a needed medication.   If a prior authorization is required to get your medication covered by your insurance company, please allow us  1-2 business days to complete this process.  Drug prices often vary depending on where the prescription is  filled and some pharmacies may offer cheaper prices.  The website www.goodrx.com contains coupons for medications through different pharmacies. The prices here do not account for what the cost may be with help from insurance (it may be cheaper with your insurance), but the website can give you the price if you did not use any insurance.  - You can print the associated coupon and take it with your prescription to the pharmacy.  - You may also stop by our office during regular business hours and pick up a GoodRx coupon card.  - If you need your prescription sent electronically to a different pharmacy, notify our office through [facility] or by phone at [phone] option 4.     Si Usted Necesita Algo Despus de Su Visita  Tambin puede enviarnos un mensaje a travs de Clinical cytogeneticist. Por lo general respondemos a los mensajes de MyChart en el transcurso de 1 a 2 das hbiles.  Para renovar recetas, por favor pida a su farmacia que se ponga en contacto con nuestra oficina. Randi lakes de fax es [place] [phone].  Si tiene un asunto urgente cuando la clnica est cerrada y que no puede esperar hasta el siguiente da hbil, puede llamar/localizar a su doctor(a) al nmero que aparece a continuacin.   Por favor, tenga en cuenta que aunque hacemos todo lo posible para estar disponibles para asuntos urgentes fuera del horario de [place], no estamos disponibles las 24 horas del da, los 7 [address]  [address] de la [place].   Si tiene un problema urgente y no puede comunicarse con nosotros, puede optar por buscar atencin mdica  en el consultorio de su doctor(a), en una clnica privada, en un centro de atencin urgente o en una sala de emergencias.  Si tiene Engineer, drilling, por favor llame inmediatamente al 911 o vaya a la sala de emergencias.  Nmeros de bper  - Dr. Hester: [phone]  - Dra. Jackquline: [phone]  - Dr. Claudene: [phone]  - Dra. Kitts: [phone]  En caso de  inclemencias del [place], por favor llame a nuestra lnea principal al [phone] para una actualizacin sobre el estado de cualquier retraso o cierre.  Consejos para la medicacin en dermatologa: Por favor, guarde las cajas en las que vienen los medicamentos de uso tpico para ayudarle a seguir las instrucciones sobre dnde y cmo usarlos. Las farmacias generalmente imprimen las instrucciones del medicamento slo en las cajas y no directamente en los tubos del [place].   Si su medicamento es muy caro, por favor, pngase en contacto con landry rieger llamando al [phone] y presione la opcin 4 o envenos un mensaje a travs de Clinical cytogeneticist.   No podemos decirle cul ser su copago por los medicamentos por adelantado ya que esto es diferente dependiendo de la cobertura de su seguro. Sin embargo, es posible que podamos encontrar un medicamento sustituto a Audiological scientist un formulario para que el seguro cubra el medicamento que se considera necesario.  Si se requiere una autorizacin previa para que su compaa de seguros malta su medicamento, por favor permtanos de 1 a 2 das hbiles para completar este proceso.  Los precios de los medicamentos varan con frecuencia dependiendo del Environmental consultant de dnde se surte la receta y alguna farmacias pueden ofrecer precios ms baratos.  El sitio web www.goodrx.com tiene cupones para medicamentos de Health and safety inspector. Los precios aqu no tienen en cuenta lo que podra costar con la ayuda del seguro (puede ser ms barato con su seguro), pero el sitio web puede darle el precio si no utiliz Tourist information centre manager.  - Puede imprimir el cupn correspondiente y llevarlo con su receta a la farmacia.  - Tambin puede pasar por nuestra oficina durante el horario de atencin regular y Education officer, museum una tarjeta de cupones de GoodRx.  - Si necesita que su receta se enve electrnicamente a una farmacia diferente, informe a nuestra oficina a travs de MyChart de [place] o  por telfono llamando al [phone] y presione la opcin 4.

## 2024-01-22 NOTE — Progress Notes (Signed)
   Follow-Up Visit   Subjective  Kristina Banks is a 83 y.o. female who presents for the following: Skin tear on right elbow; happened in a fall on Saturday. Patient denied hitting her head or trauma to other areas other than her right shoulder. Also with bruising on arms. Seeing ortho today.   The following portions of the chart were reviewed this encounter and updated as appropriate: medications, allergies, medical history  Review of Systems:  No other skin or systemic complaints except as noted in HPI or Assessment and Plan.  Objective  Well appearing patient in no apparent distress; mood and affect are within normal limits.  A focused examination was performed of the following areas: Arm     Right posterior upper extremity with 3cm superficial laceration    Assessment & Plan   Superficial Skin Tear to right elbow s/p fall  - Apply Vaseline and a non stick bandage daily   - Applied mupirocin , non stick bandage, coban in clinic today  - Discussed healing will take some time  - Mupirocin  ointment sent to pharmacy - advised can use this or plain vaseline  R Shoulder pain s/p fall  - Encouraged follow up with ortho for shoulder pain - seeing today   Solar and traumatic purpura to upper extremities  - Management as per above    Return if symptoms worsen or fail to improve.  I, Emerick Ege, CMA am acting as scribe for Lauraine JAYSON Kanaris, MD.   Documentation: I have reviewed the above documentation for accuracy and completeness, and I agree with the above.  Lauraine JAYSON Kanaris, MD

## 2024-02-11 ENCOUNTER — Ambulatory Visit (INDEPENDENT_AMBULATORY_CARE_PROVIDER_SITE_OTHER): Admitting: Podiatry

## 2024-02-11 ENCOUNTER — Other Ambulatory Visit (INDEPENDENT_AMBULATORY_CARE_PROVIDER_SITE_OTHER): Payer: Self-pay | Admitting: Vascular Surgery

## 2024-02-11 VITALS — Ht 66.0 in | Wt 125.0 lb

## 2024-02-11 DIAGNOSIS — M25571 Pain in right ankle and joints of right foot: Secondary | ICD-10-CM

## 2024-02-11 DIAGNOSIS — I6523 Occlusion and stenosis of bilateral carotid arteries: Secondary | ICD-10-CM

## 2024-02-12 ENCOUNTER — Other Ambulatory Visit (INDEPENDENT_AMBULATORY_CARE_PROVIDER_SITE_OTHER): Payer: Medicare Other

## 2024-02-12 ENCOUNTER — Encounter (INDEPENDENT_AMBULATORY_CARE_PROVIDER_SITE_OTHER): Payer: Self-pay | Admitting: Nurse Practitioner

## 2024-02-12 ENCOUNTER — Ambulatory Visit (INDEPENDENT_AMBULATORY_CARE_PROVIDER_SITE_OTHER): Payer: Medicare Other | Admitting: Nurse Practitioner

## 2024-02-12 VITALS — BP 135/72 | HR 69 | Resp 16 | Wt 126.6 lb

## 2024-02-12 DIAGNOSIS — I6523 Occlusion and stenosis of bilateral carotid arteries: Secondary | ICD-10-CM | POA: Diagnosis not present

## 2024-02-12 DIAGNOSIS — I10 Essential (primary) hypertension: Secondary | ICD-10-CM

## 2024-02-12 DIAGNOSIS — E782 Mixed hyperlipidemia: Secondary | ICD-10-CM

## 2024-02-12 NOTE — Progress Notes (Signed)
 Subjective:    Patient ID: Kristina Banks, female    DOB: 09/10/1940, 83 y.o.   MRN: [id number] Chief Complaint  Patient presents with  . Follow-up    1 yr carotid follow up    The patient is seen for follow up evaluation of carotid stenosis. The carotid stenosis followed by ultrasound.   The patient denies amaurosis fugax. There is no recent history of TIA symptoms or focal motor deficits. There is no prior documented CVA.  The patient is taking enteric-coated aspirin  81 mg daily.  There is no history of migraine headaches. There is no history of seizures.  No recent shortening of the patient's walking distance or new symptoms consistent with claudication.  No history of rest pain symptoms. No new ulcers or wounds of the lower extremities have occurred.  There is no history of DVT, PE or superficial thrombophlebitis. No documented recent episodes of angina or shortness of breath documented.   Carotid Duplex done today shows <40% stenosis bilaerally.  No change compared to last study in 02/05/2023    Review of Systems  All other systems reviewed and are negative.      Objective:   Physical Exam Vitals reviewed.  HENT:     Head: Normocephalic.  Cardiovascular:     Rate and Rhythm: Normal rate.     Pulses: Normal pulses.  Pulmonary:     Effort: Pulmonary effort is normal.  Skin:    General: Skin is warm and dry.  Neurological:     Mental Status: She is alert and oriented to person, place, and time.  Psychiatric:        Mood and Affect: Mood normal.        Behavior: Behavior normal.        Thought Content: Thought content normal.        Judgment: Judgment normal.     BP 135/72   Pulse 69   Resp 16   Wt 126 lb 9.6 oz (57.4 kg)   BMI 20.43 kg/m   Past Medical History:  Diagnosis Date  . Actinic keratosis   . Arthritis   . Basal cell carcinoma (BCC) of right cheek 01/03/2016  . BCC (basal cell carcinoma of skin) 11/30/2020   right upper quadrant 14 cm  lateral to midline - ED&C  . Change in hair   . Complication of anesthesia    takes a little longer to wake up   . DDD (degenerative disc disease), cervical   . Discolored nails    TOENAILS  . HBP (high blood pressure)   . History of dysplastic nevus 03/13/2013   Left inf. lat. buttock, left lat. hip. Mild atypia, edges free  . Hypertension   . Muscle pain   . PONV (postoperative nausea and vomiting)    nausea  . Shortness of breath dyspnea    with exertion  . 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 Stroke [facility]) 2006   tia  . TIA (transient ischemic attack)     Social History   Socioeconomic History  . Marital status: Widowed    Spouse name: Not on file  . Number of children: Not on file  . Years of education: Not on file  . Highest education level: Not on file  Occupational History  . Not on file  Tobacco Use  . Smoking status: Never  . Smokeless tobacco: Never  Vaping Use  . Vaping status: Never  Used  Substance and Sexual Activity  . Alcohol use: No  . Drug use: No  . Sexual activity: Not on file  Other Topics Concern  . Not on file  Social History Narrative   Lives alone. Daughter assists pt as needed.   Social Drivers of Corporate investment banker Strain: Low Risk  (05/18/2023)   Received from [facility]   Overall Financial Resource Strain (CARDIA)   . Difficulty of Paying Living Expenses: Not hard at all  Food Insecurity: No Food Insecurity (09/05/2023)   Hunger Vital Sign   . Worried About Programme researcher, broadcasting/film/video in the Last Year: Never true   . Ran Out of Food in the Last Year: Never true  Transportation Needs: Patient Declined (09/05/2023)   PRAPARE - Transportation   . Lack of Transportation (Medical): Patient declined   . Lack of Transportation (Non-Medical): Patient declined  Physical Activity: Inactive (01/27/2019)   Exercise Vital Sign   . Days of Exercise  per Week: 0 days   . Minutes of Exercise per Session: 0 min  Stress: No Stress Concern Present (01/27/2019)   [employer] of Occupational Health - Occupational Stress Questionnaire   . Feeling of Stress : Not at all  Social Connections: Patient Declined (09/05/2023)   Social Connection and Isolation Panel   . Frequency of Communication with Friends and Family: Patient declined   . Frequency of Social Gatherings with Friends and Family: Patient declined   . Attends Religious Services: Patient declined   . Active Member of Clubs or Organizations: Patient declined   . Attends Banker Meetings: Patient declined   . Marital Status: Patient declined  Intimate Partner Violence: Patient Declined (09/05/2023)   Humiliation, Afraid, Rape, and Kick questionnaire   . Fear of Current or Ex-Partner: Patient declined   . Emotionally Abused: Patient declined   . Physically Abused: Patient declined   . Sexually Abused: Patient declined    Past Surgical History:  Procedure Laterality Date  . ABDOMINAL HYSTERECTOMY    . ARM SURGERY Left    BONE TUMOR  . ARTHOSCOPIC ROTAOR CUFF REPAIR Left 12/16/2014   Procedure: ARTHROSCOPIC ROTATOR CUFF REPAIR;  Surgeon: Kayla Pinal, MD;  Location: [facility] ORS;  Service: Orthopedics;  Laterality: Left;  . bone tumor removed     Lt arm  . CATARACT EXTRACTION W/PHACO Right 11/01/2015   Procedure: CATARACT EXTRACTION PHACO AND INTRAOCULAR LENS PLACEMENT (IOC);  Surgeon: Steven Dingeldein, MD;  Location: [facility] ORS;  Service: Ophthalmology;  Laterality: Right;  US  01:18 AP% 22.8 CDE 36.00 fluid pack lot # [id number]  . CATARACT EXTRACTION W/PHACO Left 12/22/2015   Procedure: CATARACT EXTRACTION PHACO AND INTRAOCULAR LENS PLACEMENT (IOC);  Surgeon: Steven Dingeldein, MD;  Location: [facility] ORS;  Service: Ophthalmology;  Laterality: Left;  US  00:51 AP% 23.4 CDE 22.40 Fluid pack lot # [id number]  . COLONOSCOPY    . COLONOSCOPY WITH PROPOFOL  N/A 04/18/2017    Procedure: COLONOSCOPY WITH PROPOFOL ;  Surgeon: Viktoria Lamar DASEN, MD;  Location[facility] ENDOSCOPY;  Service: Endoscopy;  Laterality: N/A;  . FOOT SURGERY Right    10.3.14  . FRACTURE SURGERY    . fractured knee Left   . LUMBAR LAMINECTOMY/DECOMPRESSION MICRODISCECTOMY Left 07/07/2015   Procedure: Left Lumbar two-three Extraforaminal Microdiskectomy;  Surgeon: Catalina Stains, MD;  Location: [facility] NEURO ORS;  Service: Neurosurgery;  Laterality: Left;  Left L2-3 Extraforaminal Microdiskectomy  . REVERSE SHOULDER ARTHROPLASTY Right 09/05/2023   Procedure: ARTHROPLASTY, SHOULDER, TOTAL, REVERSE;  Surgeon:  Kay Kemps, MD;  Location: [facility] ORS;  Service: Orthopedics;  Laterality: Right;  . TONSILLECTOMY      Family History  Problem Relation Age of Onset  . Varicose Veins Neg Hx     Allergies  Allergen Reactions  . Codeine Nausea And Vomiting and Other (See Comments)    Hallucinations  . Hydrocodone      Other reaction(s): Not available  . Other   . Tramadol  Other (See Comments)    Agitation   . Cephalexin  Other (See Comments)    Dizziness, PATIENT DENIES  . Oxycodone  Other (See Comments)    Dizzy       Latest Ref Rng & Units 09/06/2023    3:44 AM 08/24/2023   11:32 AM 01/27/2019    2:44 PM  CBC  WBC 4.0 - 10.5 K/uL  8.7  15.0   Hemoglobin 12.0 - 15.0 g/dL 88.7  87.1  85.4   Hematocrit 36.0 - 46.0 % 34.7  40.4  42.9   Platelets 150 - 400 K/uL  247  291       CMP     Component Value Date/Time   NA 133 (L) 09/06/2023 0344   K 3.9 09/06/2023 0344   CL 105 09/06/2023 0344   CO2 21 (L) 09/06/2023 0344   GLUCOSE 174 (H) 09/06/2023 0344   BUN 16 09/06/2023 0344   CREATININE 0.80 09/06/2023 0344   CALCIUM  8.2 (L) 09/06/2023 0344   PROT 6.9 01/27/2019 1444   ALBUMIN 4.1 01/27/2019 1444   AST 21 01/27/2019 1444   ALT 20 01/27/2019 1444   ALKPHOS 41 01/27/2019 1444   BILITOT 0.7 01/27/2019 1444   GFRNONAA >60 09/06/2023 0344     No results found.     Assessment & Plan:    1. Bilateral carotid artery stenosis (Primary) Recommend:  Given the patient's asymptomatic subcritical stenosis no further invasive testing or surgery at this time.  Duplex ultrasound shows <40% stenosis bilaterally.  Continue antiplatelet therapy as prescribed Continue management of CAD, HTN and Hyperlipidemia Healthy heart diet,  encouraged exercise at least 4 times per week  Follow up in 12 months with duplex ultrasound and physical exam   2. Primary hypertension Continue antihypertensive medications as already ordered, these medications have been reviewed and there are no changes at this time.  3. Mixed hyperlipidemia Continue statin as ordered and reviewed, no changes at this time   Current Outpatient Medications on File Prior to Visit  Medication Sig Dispense Refill  . acetaminophen  (TYLENOL ) 500 MG tablet Take 2 tablets (1,000 mg total) by mouth every 8 (eight) hours as needed for moderate pain (pain score 4-6). 60 tablet 1  . amLODipine  (NORVASC ) 5 MG tablet Take 5 mg by mouth daily.    . aspirin  325 MG tablet Take 325 mg by mouth daily.    . Biotin  10 MG TABS Take 10 mg by mouth daily.    . Calcium  Carbonate-Vitamin D  (CALCIUM  600 + D PO) Take 1 tablet by mouth daily.    . chlorhexidine  (HIBICLENS ) 4 % external liquid Apply 15 mLs (1 Application total) topically as directed for 30 doses. Use as directed daily for 5 days every other week for 6 weeks. 946 mL 1  . Cholecalciferol  (VITAMIN D ) 50 MCG (2000 UT) CAPS Take 2,000 Units by mouth daily.    . meloxicam  (MOBIC ) 15 MG tablet Take 1 tablet (15 mg total) by mouth daily. 30 tablet 0  . mupirocin  ointment (BACTROBAN ) 2 % Apply to bites  QD 22 g 1  . mupirocin  ointment (BACTROBAN ) 2 % Apply to affected area of skin three times daily until healed 30 g 1  . olmesartan (BENICAR) 20 MG tablet Take 20 mg by mouth daily.    . ondansetron  (ZOFRAN ) 4 MG tablet Take 1 tablet (4 mg total) by mouth every 8 (eight) hours as needed for  vomiting, refractory nausea / vomiting or nausea. 30 tablet 1  . rosuvastatin  (CRESTOR ) 10 MG tablet Take 1 tablet (10 mg total) by mouth daily at 6 PM. 30 tablet 1  . traMADol  (ULTRAM ) 50 MG tablet Take 1 tablet (50 mg total) by mouth every 6 (six) hours as needed for severe pain (pain score 7-10) (for breakthrough pain, take with food). 25 tablet 0  . donepezil  (ARICEPT ) 10 MG tablet Take 10 mg by mouth at bedtime. (Patient not taking: Reported on 02/12/2024)     No current facility-administered medications on file prior to visit.    There are no Patient Instructions on file for this visit. No follow-ups on file.   Vanda Waskey E Athalia Setterlund, NP

## 2024-02-12 NOTE — Progress Notes (Signed)
  Subjective:  Patient ID: Kristina Banks, female    DOB: Mar 27, 1941,  MRN: [id number]  Chief Complaint  Patient presents with   Foot Pain    Rm 7 right foot pain requesting injection    Discussed the use of AI scribe software for clinical note transcription with the patient, who gave verbal consent to proceed.  History of Present Illness Kristina Banks is an 83 year old female who returns for follow-up the last injection helped quite a bit and recently started wear off and would like to proceed with this again      Objective:    Physical Exam VASCULAR: DP and PT pulse palpable. Foot is warm and well-perfused. Capillary fill time is brisk. DERMATOLOGIC: Normal skin turgor, texture, and temperature. No open lesions, rashes, or ulcerations. NEUROLOGIC: Normal sensation to light touch and pressure. No paresthesias on examination. ORTHOPEDIC: Pain right foot over sinus tarsi. No pain over ATFL or CFL. Pain worse with inversion, eversion, and resisted plantar and dorsal flexion. Smooth pain-free range of motion of all other examined joints. No ecchymosis, bruising, or gross deformity.   No images are attached to the encounter.    Results RADIOLOGY Foot X-ray: No significant abnormalities detected, midfoot and mild subtalar arthritis   Assessment:   1. Sinus tarsi syndrome of right ankle      Plan:  Patient was evaluated and treated and all questions answered.  Assessment and Plan Assessment & Plan Sinus tarsitis and subtalar arthritis, right foot subacute sprain  Her sinus tarsi syndrome previously responded well to a corticosteroid injection.  We discussed the risks and benefits of further injection.  It has been nearly 55-month since the last 1.  Reasonable to proceed with this again today.  Following discussion of the risk and benefits of the right sinus tarsi was prepped with Betadine  and ethyl chloride spray was used to topical anesthetic and 20 mg of Kenalog and 5 mg of  Marcaine  were injected into subtalar joint for a lateral approach.  She tolerated this well was recommended.  Follow-up as needed for this.      No follow-ups on file.

## 2024-03-12 ENCOUNTER — Ambulatory Visit (INDEPENDENT_AMBULATORY_CARE_PROVIDER_SITE_OTHER)

## 2024-03-12 ENCOUNTER — Ambulatory Visit: Admitting: Podiatry

## 2024-03-12 DIAGNOSIS — M65979 Unspecified synovitis and tenosynovitis, unspecified ankle and foot: Secondary | ICD-10-CM

## 2024-03-12 DIAGNOSIS — M65061 Abscess of tendon sheath, right lower leg: Secondary | ICD-10-CM | POA: Diagnosis not present

## 2024-03-12 DIAGNOSIS — M19071 Primary osteoarthritis, right ankle and foot: Secondary | ICD-10-CM

## 2024-03-12 MED ORDER — TRIAMCINOLONE ACETONIDE 40 MG/ML IJ SUSP
20.0000 mg | Freq: Once | INTRAMUSCULAR | Status: AC
  Administered 2024-03-12: 20 mg

## 2024-03-12 NOTE — Progress Notes (Signed)
 She presents today concerned about her right foot she has pain along the lateral side.  She also has pain across the dorsal aspect.  She saw Dr. Silva last visit who injected her sinus tarsi she states is still a little tender and there but the majority of my pain is across here she points to the sinus tarsi and across the anterior ankle.  Objective: Vital signs are stable she is alert and oriented x 3.  Osteoarthritis on radiographs today of the midfoot.  Osteopenia is also noted.  Assessment: Osteoarthritis capsulitis dorsal aspect right foot.  Plan: I injected the dorsal aspect today with Kenalog and local anesthetic started her on Voltaren gel as well.

## 2024-03-13 ENCOUNTER — Ambulatory Visit: Payer: PRIVATE HEALTH INSURANCE | Admitting: Dermatology

## 2024-03-13 DIAGNOSIS — L820 Inflamed seborrheic keratosis: Secondary | ICD-10-CM

## 2024-03-13 DIAGNOSIS — L814 Other melanin hyperpigmentation: Secondary | ICD-10-CM

## 2024-03-13 DIAGNOSIS — L570 Actinic keratosis: Secondary | ICD-10-CM | POA: Diagnosis not present

## 2024-03-13 DIAGNOSIS — L578 Other skin changes due to chronic exposure to nonionizing radiation: Secondary | ICD-10-CM | POA: Diagnosis not present

## 2024-03-13 DIAGNOSIS — Z1283 Encounter for screening for malignant neoplasm of skin: Secondary | ICD-10-CM

## 2024-03-13 DIAGNOSIS — D692 Other nonthrombocytopenic purpura: Secondary | ICD-10-CM

## 2024-03-13 DIAGNOSIS — Z86018 Personal history of other benign neoplasm: Secondary | ICD-10-CM

## 2024-03-13 DIAGNOSIS — W908XXA Exposure to other nonionizing radiation, initial encounter: Secondary | ICD-10-CM | POA: Diagnosis not present

## 2024-03-13 DIAGNOSIS — Z85828 Personal history of other malignant neoplasm of skin: Secondary | ICD-10-CM

## 2024-03-13 DIAGNOSIS — Z8589 Personal history of malignant neoplasm of other organs and systems: Secondary | ICD-10-CM

## 2024-03-13 DIAGNOSIS — L821 Other seborrheic keratosis: Secondary | ICD-10-CM | POA: Diagnosis not present

## 2024-03-13 DIAGNOSIS — D1801 Hemangioma of skin and subcutaneous tissue: Secondary | ICD-10-CM

## 2024-03-13 DIAGNOSIS — D229 Melanocytic nevi, unspecified: Secondary | ICD-10-CM

## 2024-03-13 NOTE — Patient Instructions (Addendum)

## 2024-03-13 NOTE — Progress Notes (Signed)
 Follow-Up Visit   Subjective  Kristina Banks is a 83 y.o. female who presents for the following: Skin Cancer Screening and Full Body Skin Exam, hx of skin cancers.   The patient presents for Total-Body Skin Exam (TBSE) for skin cancer screening and mole check. The patient has spots, moles and lesions to be evaluated, some may be new or changing and the patient may have concern these could be cancer.  The following portions of the chart were reviewed this encounter and updated as appropriate: medications, allergies, medical history  Review of Systems:  No other skin or systemic complaints except as noted in HPI or Assessment and Plan.  Objective  Well appearing patient in no apparent distress; mood and affect are within normal limits.  A full examination was performed including scalp, head, eyes, ears, nose, lips, neck, chest, axillae, abdomen, back, buttocks, bilateral upper extremities, bilateral lower extremities, hands, feet, fingers, toes, fingernails, and toenails. All findings within normal limits unless otherwise noted below.   Relevant physical exam findings are noted in the Assessment and Plan.  back x 6, left hand x 1, chest x 3 (10) Stuck-on, waxy, tan-brown papules and plaques -- Discussed benign etiology and prognosis.  right lateral nose x 2 (2) Erythematous thin papules/macules with gritty scale.   Assessment & Plan   SKIN CANCER SCREENING PERFORMED TODAY.  LENTIGINES, SEBORRHEIC KERATOSES, HEMANGIOMAS - Benign normal skin lesions - Benign-appearing - Call for any changes  Purpura - Chronic; persistent and recurrent.  Treatable, but not curable. Arms  - Violaceous macules and patches - Benign - Related to trauma, age, sun damage and/or use of blood thinners, chronic use of topical and/or oral steroids - Observe - Can use OTC arnica containing moisturizer such as Dermend Bruise Formula if desired - Call for worsening or other concerns   MELANOCYTIC NEVI -  Tan-brown and/or pink-flesh-colored symmetric macules and papules - Benign appearing on exam today - Observation - Call clinic for new or changing moles - Recommend daily use of broad spectrum spf 30+ sunscreen to sun-exposed areas.   HISTORY OF DYSPLASTIC NEVUS No evidence of recurrence today Recommend regular full body skin exams Recommend daily broad spectrum sunscreen SPF 30+ to sun-exposed areas, reapply every 2 hours as needed.  Call if any new or changing lesions are noted between office visits   HISTORY OF BASAL CELL CARCINOMA OF THE SKIN - No evidence of recurrence today - Recommend regular full body skin exams - Recommend daily broad spectrum sunscreen SPF 30+ to sun-exposed areas, reapply every 2 hours as needed.  - Call if any new or changing lesions are noted between office visits   HISTORY OF SQUAMOUS CELL CARCINOMA OF THE SKIN - No evidence of recurrence today - No lymphadenopathy - Recommend regular full body skin exams - Recommend daily broad spectrum sunscreen SPF 30+ to sun-exposed areas, reapply every 2 hours as needed.  - Call if any new or changing lesions are noted between office visits    INFLAMED SEBORRHEIC KERATOSIS (10) back x 6, left hand x 1, chest x 3 (10) Symptomatic, irritating, patient would like treated.  Destruction of lesion - back x 6, left hand x 1, chest x 3 (10) Complexity: simple   Destruction method: cryotherapy   Informed consent: discussed and consent obtained   Timeout:  patient name, date of birth, surgical site, and procedure verified Lesion destroyed using liquid nitrogen: Yes   Region frozen until ice ball extended beyond lesion: Yes  Outcome: patient tolerated procedure well with no complications   Post-procedure details: wound care instructions given    AK (ACTINIC KERATOSIS) (2) right lateral nose x 2 (2) ACTINIC DAMAGE - chronic, secondary to cumulative UV radiation exposure/sun exposure over time - diffuse scaly  erythematous macules with underlying dyspigmentation - Recommend daily broad spectrum sunscreen SPF 30+ to sun-exposed areas, reapply every 2 hours as needed.  - Recommend staying in the shade or wearing long sleeves, sun glasses (UVA+UVB protection) and wide brim hats (4-inch brim around the entire circumference of the hat). - Call for new or changing lesions.   If not gone in 2 months return to the office  Destruction of lesion - right lateral nose x 2 (2) Complexity: simple   Destruction method: cryotherapy   Informed consent: discussed and consent obtained   Timeout:  patient name, date of birth, surgical site, and procedure verified Lesion destroyed using liquid nitrogen: Yes   Region frozen until ice ball extended beyond lesion: Yes   Outcome: patient tolerated procedure well with no complications   Post-procedure details: wound care instructions given     Return in about 1 year (around 03/13/2025) for TBSE, hx of BCC, hx of SCC, hx of Dysplastic nevus .  IFay Kirks, CMA, am acting as scribe for Alm Rhyme, MD .   Documentation: I have reviewed the above documentation for accuracy and completeness, and I agree with the above.  Alm Rhyme, MD

## 2024-03-19 ENCOUNTER — Encounter: Payer: Self-pay | Admitting: Dermatology

## 2024-06-04 ENCOUNTER — Ambulatory Visit: Admitting: Podiatry

## 2024-06-04 VITALS — Ht 66.0 in | Wt 126.0 lb

## 2024-06-04 DIAGNOSIS — M19071 Primary osteoarthritis, right ankle and foot: Secondary | ICD-10-CM

## 2024-06-04 NOTE — Patient Instructions (Signed)
" °  [facility] at [facility] Radiation Oncology Department [address] [phone] (office) [phone] (fax)   "

## 2024-06-04 NOTE — Progress Notes (Signed)
"  °  Subjective:  Patient ID: Kristina Banks, female    DOB: [birth year],  MRN: [id number]  Chief Complaint  Patient presents with   Foot Pain    RM 2 Patient is here for right ankle/foot pain. Pt is requesting an injection today. Pt states pain on the lateral dorsal aspect of the right foot.    Discussed the use of AI scribe software for clinical note transcription with the patient, who gave verbal consent to proceed.  History of Present Illness Kristina Banks is an 84 year old female who returns for follow-up the last injection helped some but is wearing off.      Objective:    Physical Exam VASCULAR: DP and PT pulse palpable. Foot is warm and well-perfused. Capillary fill time is brisk. DERMATOLOGIC: Normal skin turgor, texture, and temperature. No open lesions, rashes, or ulcerations. NEUROLOGIC: Normal sensation to light touch and pressure. No paresthesias on examination. ORTHOPEDIC: Today pain over the midfoot and the fourth tarsal metatarsal base.   No images are attached to the encounter.    Results RADIOLOGY Foot X-ray: No significant abnormalities detected, moderate midfoot and mild subtalar arthritis   Assessment:   1. Osteoarthritis of right ankle and foot      Plan:  Patient was evaluated and treated and all questions answered.  Assessment and Plan Assessment & Plan Sinus tarsitis and subtalar arthritis, right foot subacute sprain  Pain continues to be limiting for her now today primarily in the midfoot.  Recommended corticosteroid injection again today.  Following consent and prep with Betadine  the fourth tarsometatarsal joint and base was injected with 10 mg of Kenalog  and 4 mg of dexamethasone  phosphate.  She tolerated this well.  We also discussed long-term treatment with other options.  We discussed low-dose radiation therapy and I recommended consultation for this.  Referral placed and she will follow-up with radiation oncology for this.  Follow-up with me  as needed.      Return if symptoms worsen or fail to improve.   "

## 2024-06-18 ENCOUNTER — Telehealth: Payer: Self-pay | Admitting: Radiation Oncology

## 2024-06-18 NOTE — Telephone Encounter (Signed)
 Patient called and left a voicemail requesting her appointment for tomorrow be rescheduled back to tomorrow. Please call her at [phone]

## 2024-06-19 ENCOUNTER — Encounter: Payer: Self-pay | Admitting: Radiation Oncology

## 2024-06-19 ENCOUNTER — Ambulatory Visit: Payer: PRIVATE HEALTH INSURANCE | Admitting: Radiation Oncology

## 2024-06-19 ENCOUNTER — Ambulatory Visit
Admission: RE | Admit: 2024-06-19 | Discharge: 2024-06-19 | Attending: Radiation Oncology | Admitting: Radiation Oncology

## 2024-06-19 DIAGNOSIS — M1612 Unilateral primary osteoarthritis, left hip: Secondary | ICD-10-CM

## 2024-06-19 NOTE — Consult Note (Signed)
 " NEW PATIENT EVALUATION  Name: Kristina Banks  MRN: [id number]  Date:   06/19/2024     DOB: 07-12-1940   This 84 y.o. 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  REFERRING PHYSICIAN: Cleotilde Oneil FALCON, MD  CHIEF COMPLAINT: No chief complaint on file.   DIAGNOSIS: There were no encounter diagnoses.   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  Patient has received multiple injections without benefit.  She complains of pain over her midfoot fourth tarsal and ankle.  Plain films showed moderate to midfoot and mild subtalar arthritis.  She is referred to radiation oncology for consideration of LDR.  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    PAST SURGICAL HISTORY:  Past Surgical History:  Procedure Laterality Date   ABDOMINAL HYSTERECTOMY     ARM SURGERY Left    BONE TUMOR   ARTHOSCOPIC ROTAOR CUFF REPAIR Left 12/16/2014   Procedure: ARTHROSCOPIC ROTATOR CUFF REPAIR;  Surgeon: Kayla Cleotilde, MD;  Location: [facility];  Service: Orthopedics;  Laterality: Left;   bone tumor removed     Lt arm   CATARACT EXTRACTION W/PHACO Right 11/01/2015   Procedure: CATARACT EXTRACTION PHACO AND INTRAOCULAR LENS PLACEMENT (IOC);   Surgeon: Steven Dingeldein, MD;  Location: [facility];  Service: Ophthalmology;  Laterality: Right;  US  01:18 AP% 22.8 CDE 36.00 fluid pack lot # [id number]   CATARACT EXTRACTION W/PHACO Left 12/22/2015   Procedure: CATARACT EXTRACTION PHACO AND INTRAOCULAR LENS PLACEMENT (IOC);  Surgeon: Steven Dingeldein, MD;  Location: [facility];  Service: Ophthalmology;  Laterality: Left;  US  00:51 AP% 23.4 CDE 22.40 Fluid pack lot # [id number]   COLONOSCOPY     COLONOSCOPY WITH PROPOFOL  N/A 04/18/2017   Procedure: COLONOSCOPY WITH PROPOFOL ;  Surgeon: Viktoria Lamar DASEN, MD;  Location: [facility];  Service: Endoscopy;  Laterality: N/A;   FOOT SURGERY Right    10.3.14   FRACTURE SURGERY     fractured knee Left    LUMBAR LAMINECTOMY/DECOMPRESSION MICRODISCECTOMY Left 07/07/2015   Procedure: Left Lumbar two-three Extraforaminal Microdiskectomy;  Surgeon: Catalina Stains, MD;  Location: [facility];  Service: Neurosurgery;  Laterality: Left;  Left L2-3 Extraforaminal Microdiskectomy   REVERSE SHOULDER ARTHROPLASTY Right 09/05/2023   Procedure: ARTHROPLASTY, SHOULDER, TOTAL, REVERSE;  Surgeon: Kay Kemps, MD;  Location: [facility];  Service: Orthopedics;  Laterality: Right;   TONSILLECTOMY      FAMILY HISTORY: family history is not on file.  SOCIAL HISTORY:  reports that she has never smoked. She has never used smokeless tobacco. She reports that she does not drink alcohol and does not use drugs.  ALLERGIES: Codeine, Hydrocodone , Other, Tramadol , Cephalexin , and Oxycodone   MEDICATIONS:  Current Outpatient Medications  Medication Sig Dispense Refill   acetaminophen  (TYLENOL ) 500 MG tablet Take 2 tablets (1,000 mg total) by mouth every 8 (eight) hours as needed for moderate pain (pain score 4-6). 60  tablet 1   amLODipine  (NORVASC ) 5 MG tablet Take 5 mg by mouth daily.     aspirin  325 MG tablet Take 325 mg by mouth daily.     Biotin  10 MG TABS Take 10 mg by mouth daily.     Calcium  Carbonate-Vitamin D  (CALCIUM   600 + D PO) Take 1 tablet by mouth daily.     chlorhexidine  (HIBICLENS ) 4 % external liquid Apply 15 mLs (1 Application total) topically as directed for 30 doses. Use as directed daily for 5 days every other week for 6 weeks. 946 mL 1   Cholecalciferol  (VITAMIN D ) 50 MCG (2000 UT) CAPS Take 2,000 Units by mouth daily.     donepezil  (ARICEPT ) 10 MG tablet Take 10 mg by mouth at bedtime. (Patient not taking: Reported on 06/04/2024)     meloxicam  (MOBIC ) 15 MG tablet Take 1 tablet (15 mg total) by mouth daily. 30 tablet 0   mupirocin  ointment (BACTROBAN ) 2 % Apply to bites QD 22 g 1   mupirocin  ointment (BACTROBAN ) 2 % Apply to affected area of skin three times daily until healed 30 g 1   olmesartan (BENICAR) 20 MG tablet Take 20 mg by mouth daily.     ondansetron  (ZOFRAN ) 4 MG tablet Take 1 tablet (4 mg total) by mouth every 8 (eight) hours as needed for vomiting, refractory nausea / vomiting or nausea. 30 tablet 1   rosuvastatin  (CRESTOR ) 10 MG tablet Take 1 tablet (10 mg total) by mouth daily at 6 PM. 30 tablet 1   traMADol  (ULTRAM ) 50 MG tablet Take 1 tablet (50 mg total) by mouth every 6 (six) hours as needed for severe pain (pain score 7-10) (for breakthrough pain, take with food). 25 tablet 0   No current facility-administered medications for this encounter.    ECOG PERFORMANCE STATUS:  1 - Symptomatic but completely ambulatory  REVIEW OF SYSTEMS: Patient denies any weight loss, fatigue, weakness, fever, chills or night sweats. Patient denies any loss of vision, blurred vision. Patient denies any ringing  of the ears or hearing loss. No irregular heartbeat. Patient denies heart murmur or history of fainting. Patient denies any chest pain or pain radiating to her upper extremities. Patient denies any shortness of breath, difficulty breathing at night, cough or hemoptysis. Patient denies any swelling in the lower legs. Patient denies any nausea vomiting, vomiting of blood, or coffee ground  material in the vomitus. Patient denies any stomach pain. Patient states has had normal bowel movements no significant constipation or diarrhea. Patient denies any dysuria, hematuria or significant nocturia. Patient denies any problems walking, swelling in the joints or loss of balance. Patient denies any skin changes, loss of hair or loss of weight. Patient denies any excessive worrying or anxiety or significant depression. Patient denies any problems with insomnia. Patient denies excessive thirst, polyuria, polydipsia. Patient denies any swollen glands, patient denies easy bruising or easy bleeding. Patient denies any recent infections, allergies or URI. Patient s visual fields have not changed significantly in recent time.   PHYSICAL EXAM: There were no vitals taken for this visit. Range of motion of the right ankle does elicit some pain.  Motor and sensory and DTR levels are equal symmetric in lower extremities.  Proprioception is intact.  LABORATORY DATA: Labs reviewed    RADIOLOGY RESULTS: Reports of her plain films reviewed   IMPRESSION: Osteoarthritis of the right ankle and foot in 84 year old female status post multiple injections with persistent pain  PLAN: At this time  I believe patient would be a good candidate for LDR.  Will plan on delivering 3 Gray in 6 fractions.  Risks and benefits of treatment occluding extremely low side effect profile of LDR was explained to the patient.  I personally set up in order CT simulation next week.  I would like to take this opportunity to thank you for allowing me to participate in the care of your patient.SABRA Marcey Penton, MD         "

## 2024-06-26 ENCOUNTER — Ambulatory Visit: Payer: PRIVATE HEALTH INSURANCE

## 2024-06-30 ENCOUNTER — Ambulatory Visit: Payer: PRIVATE HEALTH INSURANCE | Admitting: Dermatology

## 2025-02-11 ENCOUNTER — Encounter (INDEPENDENT_AMBULATORY_CARE_PROVIDER_SITE_OTHER)

## 2025-02-11 ENCOUNTER — Ambulatory Visit (INDEPENDENT_AMBULATORY_CARE_PROVIDER_SITE_OTHER): Admitting: Nurse Practitioner

## 2025-03-19 ENCOUNTER — Ambulatory Visit: Payer: PRIVATE HEALTH INSURANCE | Admitting: Dermatology
